# Patient Record
Sex: Male | Born: 1935
Health system: Southern US, Community
[De-identification: ages and names within clinical notes are randomized; demographics above are authoritative.]

## PROBLEM LIST (undated history)

## (undated) DIAGNOSIS — E782 Mixed hyperlipidemia: Secondary | ICD-10-CM

## (undated) DIAGNOSIS — K52831 Collagenous colitis: Secondary | ICD-10-CM

## (undated) DIAGNOSIS — G4733 Obstructive sleep apnea (adult) (pediatric): Secondary | ICD-10-CM

## (undated) DIAGNOSIS — I10 Essential (primary) hypertension: Secondary | ICD-10-CM

## (undated) DIAGNOSIS — M069 Rheumatoid arthritis, unspecified: Secondary | ICD-10-CM

## (undated) DIAGNOSIS — K449 Diaphragmatic hernia without obstruction or gangrene: Secondary | ICD-10-CM

## (undated) DIAGNOSIS — I2510 Atherosclerotic heart disease of native coronary artery without angina pectoris: Secondary | ICD-10-CM

## (undated) DIAGNOSIS — K219 Gastro-esophageal reflux disease without esophagitis: Secondary | ICD-10-CM

## (undated) DIAGNOSIS — R918 Other nonspecific abnormal finding of lung field: Secondary | ICD-10-CM

## (undated) DIAGNOSIS — C61 Malignant neoplasm of prostate: Secondary | ICD-10-CM

## (undated) HISTORY — DX: Obstructive sleep apnea (adult) (pediatric): G47.33

## (undated) HISTORY — DX: Mixed hyperlipidemia: E78.2

## (undated) HISTORY — DX: Collagenous colitis: K52.831

## (undated) HISTORY — DX: Rheumatoid arthritis, unspecified: M06.9

## (undated) HISTORY — PX: HEMORRHOID SURGERY: SHX153

## (undated) HISTORY — PX: PROSTATECTOMY: SHX69

## (undated) HISTORY — DX: Diaphragmatic hernia without obstruction or gangrene: K44.9

## (undated) HISTORY — DX: Gastro-esophageal reflux disease without esophagitis: K21.9

## (undated) HISTORY — PX: LAPAROSCOPIC CHOLECYSTECTOMY: SUR755

## (undated) HISTORY — DX: Atherosclerotic heart disease of native coronary artery without angina pectoris: I25.10

## (undated) HISTORY — DX: Malignant neoplasm of prostate: C61

## (undated) HISTORY — PX: OTHER SURGICAL HISTORY: SHX169

## (undated) HISTORY — DX: Other nonspecific abnormal finding of lung field: R91.8

## (undated) HISTORY — PX: APPENDECTOMY: SHX54

## (undated) HISTORY — DX: Essential (primary) hypertension: I10

---

## 2009-12-07 ENCOUNTER — Encounter: Payer: Self-pay | Admitting: Cardiology

## 2009-12-13 ENCOUNTER — Ambulatory Visit (HOSPITAL_COMMUNITY): Admission: RE | Admit: 2009-12-13 | Discharge: 2009-12-13 | Payer: Self-pay | Admitting: Ophthalmology

## 2009-12-27 ENCOUNTER — Ambulatory Visit (HOSPITAL_COMMUNITY): Admission: RE | Admit: 2009-12-27 | Discharge: 2009-12-27 | Payer: Self-pay | Admitting: Ophthalmology

## 2009-12-29 HISTORY — PX: CORONARY ANGIOPLASTY WITH STENT PLACEMENT: SHX49

## 2010-05-06 ENCOUNTER — Encounter: Payer: Self-pay | Admitting: Physician Assistant

## 2010-05-06 ENCOUNTER — Encounter: Payer: Self-pay | Admitting: Cardiology

## 2010-05-07 ENCOUNTER — Ambulatory Visit: Payer: Self-pay | Admitting: Cardiovascular Disease

## 2010-05-07 ENCOUNTER — Inpatient Hospital Stay (HOSPITAL_COMMUNITY): Admission: EM | Admit: 2010-05-07 | Discharge: 2010-05-09 | Payer: Self-pay | Admitting: Cardiology

## 2010-05-07 ENCOUNTER — Encounter: Payer: Self-pay | Admitting: Cardiology

## 2010-05-08 ENCOUNTER — Ambulatory Visit: Payer: Self-pay | Admitting: Cardiology

## 2010-05-09 ENCOUNTER — Encounter: Payer: Self-pay | Admitting: Cardiology

## 2010-05-10 ENCOUNTER — Telehealth (INDEPENDENT_AMBULATORY_CARE_PROVIDER_SITE_OTHER): Payer: Self-pay | Admitting: *Deleted

## 2010-05-24 ENCOUNTER — Ambulatory Visit: Payer: Self-pay | Admitting: Cardiology

## 2010-05-24 DIAGNOSIS — I2510 Atherosclerotic heart disease of native coronary artery without angina pectoris: Secondary | ICD-10-CM

## 2010-05-24 DIAGNOSIS — E782 Mixed hyperlipidemia: Secondary | ICD-10-CM

## 2010-05-24 DIAGNOSIS — I10 Essential (primary) hypertension: Secondary | ICD-10-CM | POA: Insufficient documentation

## 2010-06-03 ENCOUNTER — Telehealth (INDEPENDENT_AMBULATORY_CARE_PROVIDER_SITE_OTHER): Payer: Self-pay | Admitting: *Deleted

## 2010-06-05 ENCOUNTER — Encounter: Payer: Self-pay | Admitting: Cardiology

## 2010-06-06 ENCOUNTER — Telehealth (INDEPENDENT_AMBULATORY_CARE_PROVIDER_SITE_OTHER): Payer: Self-pay | Admitting: *Deleted

## 2010-07-24 ENCOUNTER — Ambulatory Visit: Payer: Self-pay | Admitting: Cardiology

## 2010-10-17 ENCOUNTER — Telehealth (INDEPENDENT_AMBULATORY_CARE_PROVIDER_SITE_OTHER): Payer: Self-pay | Admitting: *Deleted

## 2010-10-18 ENCOUNTER — Encounter: Payer: Self-pay | Admitting: Cardiology

## 2010-10-22 ENCOUNTER — Ambulatory Visit: Payer: Self-pay | Admitting: Cardiology

## 2010-12-09 ENCOUNTER — Telehealth: Payer: Self-pay | Admitting: Cardiology

## 2010-12-11 ENCOUNTER — Encounter: Payer: Self-pay | Admitting: Cardiology

## 2011-01-28 NOTE — Progress Notes (Signed)
Summary: Needs labs before 10/25 office visit  Phone Note Call from Patient Call back at Home Phone [phone]   Summary of Call: Pt called to see if he needs labs before office visit on 10/25. Pt is due for FLP/LFT and will do this before appt next week.  Initial call taken by: Cyril Loosen, RN, BSN,  October 17, 2010 1:28 PM

## 2011-01-28 NOTE — Progress Notes (Signed)
Summary:  CRESTOR   Phone Note Call from Patient Call back at Home Phone [phone]   Caller: wife-diane Details for Reason: Crestor Summary of Call: was d/c [facility] with Crestor-Went to [facility] and was told that his insurance Biomedical [employer]) will need to be appoved. Patient does not have any Crestor. States that [facility] pharmacy was suppose to contact our office today. home [phone] cell # [phone] Initial call taken by: Zachary George,  May 10, 2010 11:47 AM  Follow-up for Phone Call        Pt was started on Crestor 40mg  by mouth at bedtime at discharge from [facility]. He went to have this prescription filled but was told by pharmacy [facility]) that his insurance will not pay for this medication. Pt's wife states [facility] was going to send authorization to our office. We received refill authorization but no prior auth form.   Pt's wife states he has never taken any other cholesterol medication. He has a f/u appt with Dr. Diona Browner on May 27th.  Follow-up by: Cyril Loosen, RN, BSN,  May 10, 2010 2:39 PM  Additional Follow-up for Phone Call Additional follow up Details #1::        Would use Simvastatin 20 mg by mouth at bedtime instead of Crestor.  His LDL was only 85.  Get LFT/FLP in 12 weeks. Additional Follow-up by: Loreli Slot, MD, [facility],  May 13, 2010 3:12 PM    Additional Follow-up for Phone Call Additional follow up Details #2::    Left message to call back on machine. Cyril Loosen, RN, BSN  May 13, 2010 5:02 PM  Pt notified and verbalized understanding. Follow-up by: Cyril Loosen, RN, BSN,  May 14, 2010 8:41 AM  New/Updated Medications: SIMVASTATIN 20 MG TABS (SIMVASTATIN) Take one tablet by mouth daily at bedtime Prescriptions: SIMVASTATIN 20 MG TABS (SIMVASTATIN) Take one tablet by mouth daily at bedtime  #90 x 3   Entered by:   Cyril Loosen, RN, BSN   Authorized by:   Loreli Slot, MD, [facility]   Signed by:   Cyril Loosen, RN, BSN on  05/14/2010   Method used:   Faxed to ...       [facility] education [facility])       [address] [address]       [place], [place]  [address]       Ph: [phone]       Fax: [phone]   RxID:   [id number] SIMVASTATIN 20 MG TABS (SIMVASTATIN) Take one tablet by mouth daily at bedtime  #30 x 1   Entered by:   Cyril Loosen, RN, BSN   Authorized by:   Loreli Slot, MD, [facility]   Signed by:   Cyril Loosen, RN, BSN on 05/14/2010   Method used:   Electronically to        [facility] (retail)       [address]       [place], [place]  [address]       Ph: [phone]       Fax: [phone]   RxID:   [id number]   Appended Document:  CRESTOR  Pt called back today and left message on voicemail stating dose of Crestor was 40mg  and dose of simvastatin was 20mg . He wanted to confirm that this was correct. Left message to call back on wife's cell phone per pt's request.  Appended Document:  CRESTOR  Pt's wife notified and  verbalized understanding.

## 2011-01-28 NOTE — Assessment & Plan Note (Signed)
Summary: 2 MO F/U, FLP/LFT BEFORE APPT-JM   Visit Type:  Follow-up Primary Provider:  Dr. Fara Chute   History of Present Illness: 75 year old male presents for followup. He reports doing fairly well, with good energy, no angina, and no unusual breathlessness. He is remodeling a bathroom at his home and doing all the work himself.  Patient reported having diffuse muscle pain on simvastatin and the medication was discontinued. Followup labs from June showed AST 24, ALT 29, CPK 59. He was subsequently initiated on low-dose Lipitor and is tolerating this well so far. He has only been on the medication for one month.  He does report fairly easy bruising on dual antiplatelet therapy.  Preventive Screening-Counseling & Management  Alcohol-Tobacco     Smoking Status: quit     Year Quit: 1993  Current Medications (verified): 1)  Acetaminophen 325 Mg Tabs (Acetaminophen) .... Take 1-2 By Mouth Every 4 Hours As Needed 2)  Aspir-Trin 325 Mg Tbec (Aspirin) .... Take 1 Tablet By Mouth Once A Day 3)  Colcrys 0.6 Mg Tabs (Colchicine) .... Take 1 Tablet By Mouth Two Times A Day 4)  Hydroxyzine Hcl 10 Mg Tabs (Hydroxyzine Hcl) .... Take 2 Tablet By Mouth Once A Day At Bedtime 5)  Metoprolol Tartrate 25 Mg Tabs (Metoprolol Tartrate) .... Take 1/2 Tablet By Mouth Two Times A Day 6)  Nitrostat 0.4 Mg Subl (Nitroglycerin) .... Use As Directed 7)  Protonix 40 Mg Tbec (Pantoprazole Sodium) .... Take 2 Tablet By Mouth Once A Day 8)  Pilocarpine Hcl 5 Mg Tabs (Pilocarpine Hcl) .... Take 1 Tablet By Mouth Three Times A Day 9)  Effient 10 Mg Tabs (Prasugrel Hcl) .... Take 1 Tablet By Mouth Once A Day 10)  Lisinopril-Hydrochlorothiazide 20-12.5 Mg Tabs (Lisinopril-Hydrochlorothiazide) .... Take 1 Tablet By Mouth Once A Day 11)  Fish Oil 1000 Mg Caps (Omega-3 Fatty Acids) .... 3 Tabs Daily 12)  Multivitamins  Caps (Multiple Vitamin) .... Take 1 Tablet By Mouth Once A Day 13)  Zinc 50 Mg Tabs (Zinc) .... 2 Tabs  Daily 14)  C-Pap .... Uses Nightly 15)  Lipitor 10 Mg Tabs (Atorvastatin Calcium) .... Take One Tablet By Mouth At Bedtime.  Allergies (verified): 1)  ! Pcn 2)  ! Levaquin  Comments:  Nurse/Medical Assistant: The patient's medication list and allergies were reviewed with the patient and were updated in the Medication and Allergy Lists.  Past History:  Past Medical History: Last updated: 05/20/2010 G E R D Hyperlipidemia Hypertension Prostate cancer OSA Pulmonary nodules Hiatal hernia CAD - DES RCA 5/11, LVEF 55%  Social History: Last updated: 05/20/2010 Married  Tobacco Use - Former Alcohol Use - yes  Review of Systems  The patient denies anorexia, fever, chest pain, syncope, dyspnea on exertion, peripheral edema, melena, and hematochezia.         Otherwise reviewed and negative except as outlined.  Vital Signs:  Patient profile:   75 year old male Height:      69 inches Weight:      180 pounds Pulse rate:   62 / minute BP sitting:   128 / 78  (left arm) Cuff size:   regular  Vitals Entered By: Carlye Grippe (July 24, 2010 9:09 AM)  Physical Exam  Additional Exam:  Overweight male no acute distress. HEENT: Conjunctiva and lids normal, oropharynx clear. Neck: Supple, elevated JVP or bruits. Lungs: Clear to auscultation, nonlabored. Cardiac: Regular rate and rhythm, no S3. Abdomen: Soft, nontender, bowel sounds present. Skin: Warm  and dry. Musculoskeletal: No kyphosis. Extremities: No pitting edema, distal pulses full, stable left radial and right femoral catheter sites. Scattered ecchymoses on the forearms. Neuropsychiatric: Alert and oriented x3, affect appropriate.    Impression & Recommendations:  Problem # 1:  CORONARY ATHEROSCLEROSIS NATIVE CORONARY ARTERY (ICD-414.01)  Symptomatically stable on present medical regimen. Will reduce aspirin to 162 mg daily, continue Effient and other cardiac medications. Followup visit in 3 months.  His  updated medication list for this problem includes:    Aspir-trin 325 Mg Tbec (Aspirin) .Marland Kitchen... Take 1/2  tablet by mouth once a day    Metoprolol Tartrate 25 Mg Tabs (Metoprolol tartrate) .Marland Kitchen... Take 1/2 tablet by mouth two times a day    Nitrostat 0.4 Mg Subl (Nitroglycerin) ..... Use as directed    Effient 10 Mg Tabs (Prasugrel hcl) .Marland Kitchen... Take 1 tablet by mouth once a day    Lisinopril-hydrochlorothiazide 20-12.5 Mg Tabs (Lisinopril-hydrochlorothiazide) .Marland Kitchen... Take 1 tablet by mouth once a day  Problem # 2:  MIXED HYPERLIPIDEMIA (ICD-272.2)  His updated medication list for this problem includes:    Lipitor 10 Mg Tabs (Atorvastatin calcium) .Marland Kitchen... Take one tablet by mouth at bedtime.  Problem # 3:  ESSENTIAL HYPERTENSION, BENIGN (ICD-401.1)  Blood pressure well controlled today.  His updated medication list for this problem includes:    Aspir-trin 325 Mg Tbec (Aspirin) .Marland Kitchen... Take 1 tablet by mouth once a day    Metoprolol Tartrate 25 Mg Tabs (Metoprolol tartrate) .Marland Kitchen... Take 1/2 tablet by mouth two times a day    Lisinopril-hydrochlorothiazide 20-12.5 Mg Tabs (Lisinopril-hydrochlorothiazide) .Marland Kitchen... Take 1 tablet by mouth once a day  Patient Instructions: 1)  Decrease Aspirin to 162mg  by mouth once daily. This is 1/2 of your 325mg  tablets (or 2 of the 81 mg tablets). 2)  Your physician recommends that you go to the [facility] for a FASTING lipid profile and liver function labs:  DUE IN SEPT. 3)  Your physician wants you to follow-up in: 3 months. You will receive a reminder letter in the mail one-two months in advance. If you don't receive a letter, please call our office to schedule the follow-up appointment.

## 2011-01-28 NOTE — Assessment & Plan Note (Signed)
Summary: 3 MO FU   Visit Type:  Follow-up Primary Provider:  Dr. Fara Chute   History of Present Illness: 75 year old male presents for follow-up. He was seen back in July. He continues to do well without any significant angina or unusual shortness of breath. States he's been walking regularly. Reports compliance with medications.  Labs from 21 October showed AST Mathis, Kevin Mathis, cholesterol 112, triglycerides 88, HDL 39, LDL 55. We reviewed these today.  He still has some bruising on dual anti-platelet therapy. No major bleeding problems.  Clinical Review Panels:  Cardiac Imaging Cardiac Cath Findings  HEMODYNAMIC FINDINGS:  Central aortic pressure 153/83.  Left ventricular   pressure 149/7.  Left ventricular end-diastolic pressure 22.      ANGIOGRAPHIC FINDINGS:   1. Left main coronary artery had no evidence of disease.   2. Left anterior descending was a large vessel that coursed to the       apex and gave off a large diagonal branch.  There appeared to be a       heavy calcification in the proximal LAD with discrete 20% stenosis.       The mid and distal LAD had minor plaque disease.  The diagonal       branch was a large-caliber vessel with no significant disease.   3. The circumflex artery was a moderate-sized vessel that gave off a       large bifurcating obtuse marginal branch.  The proximal circumflex       had minor plaque disease.  The obtuse marginal branch had minor       plaque disease.  The AV groove circumflex had minor plaque disease.   4. The right coronary artery was a large dominant vessel that had       severe calcification throughout the proximal and midportions of the       vessel.  There were several discrete 40% stenoses in the proximal       vessel.  There was a severe 95% stenosis in the midportion of the       vessel.  There were serial 30% lesions in the distal portion of the       vessel.   5. Left ventricular angiogram was performed in the RAO  projection and       showed normal left ventricular systolic function with ejection       fraction of 55-60%. (05/08/2010)    Preventive Screening-Counseling & Management  Alcohol-Tobacco     Smoking Status: quit     Year Quit: 1993  Comments: routine 3 month f/u  Current Medications (verified): 1)  Acetaminophen 325 Mg Tabs (Acetaminophen) .... Take 1-2 By Mouth Every 4 Hours As Needed 2)  Aspir-Trin 325 Mg Tbec (Aspirin) .... Take 1/2  Tablet By Mouth Once A Day 3)  Colcrys 0.6 Mg Tabs (Colchicine) .... Take 1 Tablet By Mouth Two Times A Day 4)  Hydroxyzine Hcl 10 Mg Tabs (Hydroxyzine Hcl) .... Take 2 Tablet By Mouth Once A Day At Bedtime 5)  Metoprolol Tartrate Mathis Mg Tabs (Metoprolol Tartrate) .... Take 1/2 Tablet By Mouth Two Times A Day 6)  Nitrostat 0.4 Mg Subl (Nitroglycerin) .... Use As Directed 7)  Protonix 40 Mg Tbec (Pantoprazole Sodium) .... Take 2 Tablet By Mouth Once A Day 8)  Pilocarpine Hcl 5 Mg Tabs (Pilocarpine Hcl) .... Take 1 Tablet By Mouth Three Times A Day 9)  Effient 10 Mg Tabs (Prasugrel Hcl) .... Take 1  Tablet By Mouth Once A Day 10)  Lisinopril-Hydrochlorothiazide 20-12.5 Mg Tabs (Lisinopril-Hydrochlorothiazide) .... Take 1 Tablet By Mouth Once A Day 11)  Fish Oil 1000 Mg Caps (Omega-3 Fatty Acids) .... 3 Tabs Daily 12)  Multivitamins  Caps (Multiple Vitamin) .... Take 1 Tablet By Mouth Once A Day 13)  Zinc 50 Mg Tabs (Zinc) .... 2 Tabs Daily 14)  C-Pap .... Uses Nightly 15)  Lipitor 10 Mg Tabs (Atorvastatin Calcium) .... Take One Tablet By Mouth At Bedtime.  Allergies (verified): 1)  ! Pcn 2)  ! Levaquin  Past History:  Past Medical History: Last updated: 05/20/2010 G E R D Hyperlipidemia Hypertension Prostate cancer OSA Pulmonary nodules Hiatal hernia CAD - DES RCA 5/11, LVEF 55%  Social History: Last updated: 05/20/2010 Married  Tobacco Use - Former Alcohol Use - yes  Review of Systems  The patient denies anorexia, fever, weight  loss, chest pain, syncope, dyspnea on exertion, peripheral edema, hemoptysis, melena, hematochezia, and severe indigestion/heartburn.         Otherwise reviewed and negative except as outlined.  Vital Signs:  Patient profile:   75 year old male Height:      69 inches Weight:      177 pounds Pulse rate:   60 / minute BP sitting:   112 / 75  (right arm) Cuff size:   regular  Vitals Entered By: Hoover Brunette, LPN (October Mathis, 2011 12:58 PM) Is Patient Diabetic? No   Physical Exam  Additional Exam:  Overweight male no acute distress. HEENT: Conjunctiva and lids normal, oropharynx clear. Neck: Supple, elevated JVP or bruits. Lungs: Clear to auscultation, nonlabored. Cardiac: Regular rate and rhythm, no S3. Abdomen: Soft, nontender, bowel sounds present. Skin: Warm and dry. Scattered ecchymoses on the forearms. Musculoskeletal: No kyphosis. Extremities: No pitting edema, distal pulses full, stable left radial and right femoral catheter sites.  Neuropsychiatric: Alert and oriented x3, affect appropriate.    Impression & Recommendations:  Problem # 1:  CORONARY ATHEROSCLEROSIS NATIVE CORONARY ARTERY (ICD-414.01)  Symptomatically stable on medical therapy. Encouraged continued exercise regimen. Cut aspirin back to 81 mg daily, otherwise no medication changes. Followup scheduled for 6 months.  His updated medication list for this problem includes:    Aspir-trin 325 Mg Tbec (Aspirin) .Marland Kitchen... Take 1/2  tablet by mouth once a day    Metoprolol Tartrate Mathis Mg Tabs (Metoprolol tartrate) .Marland Kitchen... Take 1/2 tablet by mouth two times a day    Nitrostat 0.4 Mg Subl (Nitroglycerin) ..... Use as directed    Effient 10 Mg Tabs (Prasugrel hcl) .Marland Kitchen... Take 1 tablet by mouth once a day    Lisinopril-hydrochlorothiazide 20-12.5 Mg Tabs (Lisinopril-hydrochlorothiazide) .Marland Kitchen... Take 1 tablet by mouth once a day  Problem # 2:  ESSENTIAL HYPERTENSION, BENIGN (ICD-401.1)  Blood pressure well controlled  today.  His updated medication list for this problem includes:    Aspir-trin 325 Mg Tbec (Aspirin) .Marland Kitchen... Take 1/2  tablet by mouth once a day    Metoprolol Tartrate Mathis Mg Tabs (Metoprolol tartrate) .Marland Kitchen... Take 1/2 tablet by mouth two times a day    Lisinopril-hydrochlorothiazide 20-12.5 Mg Tabs (Lisinopril-hydrochlorothiazide) .Marland Kitchen... Take 1 tablet by mouth once a day  Problem # 3:  MIXED HYPERLIPIDEMIA (ICD-272.2)  LDL at goal. Continue present regimen. Followup fasting lipid profile and liver function tests for his next visit.  His updated medication list for this problem includes:    Lipitor 10 Mg Tabs (Atorvastatin calcium) .Marland Kitchen... Take one tablet by mouth at bedtime.  Patient Instructions: 1)  Your physician wants you to follow-up in: 6 months. You will receive a reminder letter in the mail one-two months in advance. If you don't receive a letter, please call our office to schedule the follow-up appointment. 2)  Your physician recommends that you go to the [facility] for a FASTING lipid profile and liver function labs:  IN 6 MONTHS BEFORE YOUR OFFICE VISIT. 3)  Decrease Aspirin to 81mg  by mouth once daily.

## 2011-01-28 NOTE — Consult Note (Signed)
Summary: CARDIOLOGY CONSULT/ [facility]  CARDIOLOGY CONSULT/ [facility]   Imported By: Zachary George 05/10/2010 12:04:51  _____________________________________________________________________  External Attachment:    Type:   Image     Comment:   External Document

## 2011-01-28 NOTE — Assessment & Plan Note (Signed)
Summary: eph-per m.spencer 2 wk fu   Visit Type:  Follow-up Primary Provider:  Dr. Fara Chute   History of Present Illness: 75 year old male presents for post-hospital follow-up. He was seen in consultation at [facility] recently with unstable angina and transferred to [facility] for cardiac catherization.  As noted below he underwent atherectomy with DES to RCA per Dr. Clifton James.  He tolerated this well, and symptomatically has improved following intervention. He and his wife recently traveled to [facility] for a vacation. He reports compliance with his medications. We did switch him from Crestor to simvastatin and he will be due for followup lipid profile and liver function tests over the next few months.  He reports occasional bruising, although no major bleeding problems on dual antiplatelet therapy. He is otherwise tolerating his medications.  Today we talked about his cardiac catheterization findings, the pathophysiology of CAD, and also a regular exercise regimen.  He is no longer taking niacin, and was also taken off of vitamin E per Dr. Reuel Boom.  Preventive Screening-Counseling & Management  Alcohol-Tobacco     Smoking Status: quit     Year Started: 35 yrs     Year Quit: 1993  Current Medications (verified): 1)  Simvastatin 20 Mg Tabs (Simvastatin) .... Take One Tablet By Mouth Daily At Bedtime 2)  Acetaminophen 325 Mg Tabs (Acetaminophen) .... Take 1-2 By Mouth Every 4 Hours As Needed 3)  Aspir-Trin 325 Mg Tbec (Aspirin) .... Take 1 Tablet By Mouth Once A Day 4)  Colcrys 0.6 Mg Tabs (Colchicine) .... Take 1 Tablet By Mouth Two Times A Day 5)  Hydroxyzine Hcl 10 Mg Tabs (Hydroxyzine Hcl) .... Take 2 Tablet By Mouth Once A Day At Bedtime 6)  Metoprolol Tartrate 25 Mg Tabs (Metoprolol Tartrate) .... Take 1/2 Tablet By Mouth Two Times A Day 7)  Nitrostat 0.4 Mg Subl (Nitroglycerin) .... Use As Directed 8)  Protonix 40 Mg Tbec (Pantoprazole Sodium) .... Take 2 Tablet By Mouth  Once A Day 9)  Pilocarpine Hcl 5 Mg Tabs (Pilocarpine Hcl) .... Take 1 Tablet By Mouth Three Times A Day 10)  Effient 10 Mg Tabs (Prasugrel Hcl) .... Take 1 Tablet By Mouth Once A Day 11)  Lisinopril-Hydrochlorothiazide 20-12.5 Mg Tabs (Lisinopril-Hydrochlorothiazide) .... Take 1 Tablet By Mouth Once A Day 12)  Fish Oil 1000 Mg Caps (Omega-3 Fatty Acids) .... 3 Tabs Daily 13)  Multivitamins  Caps (Multiple Vitamin) .... Take 1 Tablet By Mouth Once A Day 14)  Zinc 50 Mg Tabs (Zinc) .... 2 Tabs Daily 15)  C-Pap .... Uses Nightly  Allergies: 1)  ! Pcn 2)  ! Levaquin  Past History:  Past Medical History: Last updated: 05/20/2010 G E R D Hyperlipidemia Hypertension Prostate cancer OSA Pulmonary nodules Hiatal hernia CAD - DES RCA 5/11, LVEF 55%  Social History: Last updated: 05/20/2010 Married  Tobacco Use - Former Alcohol Use - yes  Review of Systems  The patient denies anorexia, fever, weight gain, chest pain, syncope, dyspnea on exertion, peripheral edema, prolonged cough, headaches, melena, hematochezia, and severe indigestion/heartburn.         Otherwise reviewed and negative.  Vital Signs:  Patient profile:   75 year old male Height:      69 inches Weight:      189.25 pounds BMI:     28.05 Pulse rate:   71 / minute BP sitting:   120 / 80  (left arm) Cuff size:   regular  Vitals Entered By: Inocencio Homes  [place], LPN (May 24, 2010 2:22 PM) Is Patient Diabetic? No   Physical Exam  Additional Exam:  Overweight male no acute distress. HEENT: Conjunctiva and lids normal, oropharynx clear. Neck: Supple, elevated JVP or bruits. Lungs: Clear to auscultation, nonlabored. Cardiac: Regular rate and rhythm, no S3. Abdomen: Soft, nontender, bowel sounds present. Skin: Warm and dry. Musculoskeletal: No kyphosis. Extremities: No pitting edema, distal pulses full, stable left radial and right femoral catheter sites. Neuropsychiatric: Alert and oriented x3, affect  appropriate.    Clinical Review Panels:  Cardiac Imaging Cardiac Cath Findings  HEMODYNAMIC FINDINGS:  Central aortic pressure 153/83.  Left ventricular   pressure 149/7.  Left ventricular end-diastolic pressure 22.      ANGIOGRAPHIC FINDINGS:   1. Left main coronary artery had no evidence of disease.   2. Left anterior descending was a large vessel that coursed to the       apex and gave off a large diagonal branch.  There appeared to be a       heavy calcification in the proximal LAD with discrete 20% stenosis.       The mid and distal LAD had minor plaque disease.  The diagonal       branch was a large-caliber vessel with no significant disease.   3. The circumflex artery was a moderate-sized vessel that gave off a       large bifurcating obtuse marginal branch.  The proximal circumflex       had minor plaque disease.  The obtuse marginal branch had minor       plaque disease.  The AV groove circumflex had minor plaque disease.   4. The right coronary artery was a large dominant vessel that had       severe calcification throughout the proximal and midportions of the       vessel.  There were several discrete 40% stenoses in the proximal       vessel.  There was a severe 95% stenosis in the midportion of the       vessel.  There were serial 30% lesions in the distal portion of the       vessel.   5. Left ventricular angiogram was performed in the RAO projection and       showed normal left ventricular systolic function with ejection       fraction of 55-60%. (05/08/2010)    Impression & Recommendations:  Problem # 1:  CORONARY ATHEROSCLEROSIS NATIVE CORONARY ARTERY (ICD-414.01)  Symptomatically stable following revascularization of the RCA as noted. He is tolerating medical therapy. We talked about advancing his exercise gradually. He will remain on dual antiplatelet therapy, for at least one year. I plan to see him back in the next 2 months.  The following medications were  removed from the medication list:    Lisinopril 20 Mg Tabs (Lisinopril) .Marland Kitchen... Take 1 tablet by mouth once a day His updated medication list for this problem includes:    Aspir-trin 325 Mg Tbec (Aspirin) .Marland Kitchen... Take 1 tablet by mouth once a day    Metoprolol Tartrate 25 Mg Tabs (Metoprolol tartrate) .Marland Kitchen... Take 1/2 tablet by mouth two times a day    Nitrostat 0.4 Mg Subl (Nitroglycerin) ..... Use as directed    Effient 10 Mg Tabs (Prasugrel hcl) .Marland Kitchen... Take 1 tablet by mouth once a day    Lisinopril-hydrochlorothiazide 20-12.5 Mg Tabs (Lisinopril-hydrochlorothiazide) .Marland Kitchen... Take 1 tablet by mouth once a day  Problem # 2:  ESSENTIAL HYPERTENSION,  BENIGN (ICD-401.1)  Blood pressure well controlled.  The following medications were removed from the medication list:    Hydrochlorothiazide 12.5 Mg Caps (Hydrochlorothiazide) .Marland Kitchen... Take 1 tablet by mouth once a day    Lisinopril 20 Mg Tabs (Lisinopril) .Marland Kitchen... Take 1 tablet by mouth once a day His updated medication list for this problem includes:    Aspir-trin 325 Mg Tbec (Aspirin) .Marland Kitchen... Take 1 tablet by mouth once a day    Metoprolol Tartrate 25 Mg Tabs (Metoprolol tartrate) .Marland Kitchen... Take 1/2 tablet by mouth two times a day    Lisinopril-hydrochlorothiazide 20-12.5 Mg Tabs (Lisinopril-hydrochlorothiazide) .Marland Kitchen... Take 1 tablet by mouth once a day  Problem # 3:  MIXED HYPERLIPIDEMIA (ICD-272.2)  Have elected to treat with simvastatin 20 mg daily instead of Crestor 40 mg daily. A followup fasting lipid profile with liver function tests will be obtained just prior to his next visit.  The following medications were removed from the medication list:    Niacin 500 Mg Tabs (Niacin) .Marland Kitchen... Take 3 tablet by mouth once a day His updated medication list for this problem includes:    Simvastatin 20 Mg Tabs (Simvastatin) .Marland Kitchen... Take one tablet by mouth daily at bedtime  Future Orders: T-Lipid Profile ([id number]) ... 07/17/2010  Other Orders: Future  Orders: T-Hepatic Function [id number]) ... 07/17/2010  Patient Instructions: 1)  FOLLOW UP APPT WITH DR. Charod Slawinski ON WED, JULY 27TH AT 9:10AM. 2)  Your physician recommends that you go to the [facility] for a FASTING lipid profile and liver function labs: DO A FEW DAYS BEFORE APPT IN [place]. Do not eat or drink after midnight.  3)  Your physician recommends that you continue on your current medications as directed. Please refer to the Current Medication list given to you today.

## 2011-01-28 NOTE — Progress Notes (Signed)
Summary: Pain with simvastatin  Phone Note Call from Patient Call back at [facility] Phone [phone]   Summary of Call: Pt called stating he's hurting "anywhere that has movement." He c/o pain in fingers, feet, ribcage, back, etc. He states this has been going on since starting the simvastatin. He didn't call before b/c he thought he just needed to get use to it but he continues to have pain. He would like to know what he should do. Pt aware Dr. Diona Browner is off today and will return tomorrow. He is aware he will be notified of Dr. Ival Bible recommendations as soon as the nurse gets an answer from him. Pt verbalized understanding.    Initial call taken by: Cyril Loosen, RN, BSN,  June 03, 2010 8:08 AM  Follow-up for Phone Call        Would stop Simvastatin for now.  Check total CK, LFT's. Follow-up by: Loreli Slot, MD, [facility],  June 04, 2010 9:39 AM  Additional Follow-up for Phone Call Additional follow up Details #1::        Pt notified and verbalized understanding. Additional Follow-up by: Cyril Loosen, RN, BSN,  June 04, 2010 2:12 PM    New/Updated Medications: SIMVASTATIN 20 MG TABS (SIMVASTATIN) Take one tablet by mouth daily at bedtime-HOLD FOR NOW

## 2011-01-28 NOTE — Progress Notes (Signed)
Summary: wants lab results, better off cholesterol med  Phone Note Call from Patient Call back at [facility] Phone [phone]   Summary of Call: Pt called for lab results. Notified these have not been received from [facility] yet but will be printed from their system and scanned in for Dr. McDowell's review. Pt's wife is aware he will be notified of results when reviewed by Dr. Diona Browner. Pt's wife states he feels better than he's felt in a long time since stopping cholesterol med. Initial call taken by: Cyril Loosen, RN, BSN,  June 06, 2010 4:51 PM

## 2011-01-30 NOTE — Letter (Signed)
Summary: External Correspondence/ [facility]  External Correspondence/ [facility]   Imported By: Dorise Hiss 12/13/2010 16:37:15  _____________________________________________________________________  External Attachment:    Type:   Image     Comment:   External Document

## 2011-01-30 NOTE — Progress Notes (Addendum)
Summary: Wrist/Feet Aching ? secondary to Lipitor  Phone Note Call from Patient Call back at Home Phone [phone]   Summary of Call: Pt states he thought he was going to do okay with Lipitor but now he thinks he may need to try something else. He states his wrists/feet ache all the time and he can't think of anything else that could be causing this other than Lipitor. He states he thinks he tried Zocor in the past. He is willing to try something else.  Initial call taken by: Cyril Loosen, RN, BSN,  December 09, 2010 8:31 AM  Follow-up for Phone Call        Would try to take Lipitor 10 mg every other day. If this is not effective, we can consider changing to Pravachol. Follow-up by: Loreli Slot, MD, [facility],  December 09, 2010 9:22 AM     Appended Document: Wrist/Feet Aching ? secondary to Lipitor Pt notified and verbalized understanding.   Appended Document: Wrist/Feet Aching ? secondary to Lipitor Pt called the office this morning stating there was no difference in his pain from taking Lipitor once daily or every other day. He states he stopped the Lipitor 1 week ago and has felt much better since stopping it.  Appended Document: Wrist/Feet Aching ? secondary to Lipitor Reviewed. Agree with stopping Lipitor, particularly since associated with resolution of symptoms. Could consider the possibility of Pravachol 20 mg daily as the next option. If this is better tolerated, would check fasting lipid profile and liver function tests in approximately 12 weeks.  Appended Document: Wrist/Feet Aching ? secondary to Lipitor Left message to call back on voicemail.  Appended Document: Wrist/Feet Aching ? secondary to Lipitor Pt notified and is willing to try Pravachol. He will notify the office if he is able to tolerate so that a 90 prescription can be sent to his [employer].   Clinical Lists Changes  Medications: Removed medication of LIPITOR 10 MG TABS (ATORVASTATIN  CALCIUM) Take one tablet by mouth at bedtime. Added new medication of PRAVASTATIN SODIUM 20 MG TABS (PRAVASTATIN SODIUM) Take one tablet by mouth daily at bedtime - Signed Rx of PRAVASTATIN SODIUM 20 MG TABS (PRAVASTATIN SODIUM) Take one tablet by mouth daily at bedtime;  #30 x 2;  Signed;  Entered by: Cyril Loosen, RN, BSN;  Authorized by: Loreli Slot, MD, [facility];  Method used: Electronically to [facility] [address], [place], [place], [place]  [address], Ph: [phone], Fax: [phone]    Prescriptions: PRAVASTATIN SODIUM 20 MG TABS (PRAVASTATIN SODIUM) Take one tablet by mouth daily at bedtime  #30 x 2   Entered by:   Cyril Loosen, RN, BSN   Authorized by:   Loreli Slot, MD, [facility]   Signed by:   Cyril Loosen, RN, BSN on 01/10/2011   Method used:   Electronically to        [facility] (retail)       [address]       [place], [place]  [address]       Ph: [phone]       Fax: [phone]   RxID:   [id number]    Appended Document: Wrist/Feet Aching ? secondary to Lipitor Pt tolerating Pravachol well. He would like [facility] He is aware we will send this today. He is also reminded to do labs in about 45  more days and that an order will be faxed to him.   Clinical Lists Changes  Medications: Rx of PRAVASTATIN SODIUM 20 MG TABS (PRAVASTATIN SODIUM) Take one tablet by mouth daily at bedtime;  #90 x 3;  Signed;  Entered by: Cyril Loosen, RN, BSN;  Authorized by: Loreli Slot, MD, [facility];  Method used: Faxed to [facility], [address] [address], [place], [place]  [address], Ph: [phone], Fax: [phone] Orders: Added new Test order of T-Lipid Profile [id number]) - Signed Added new Test order of T-Hepatic Function [id number]) - Signed    Prescriptions: PRAVASTATIN SODIUM 20 MG TABS (PRAVASTATIN SODIUM) Take one tablet by mouth daily at bedtime  #90 x 3    Entered by:   Cyril Loosen, RN, BSN   Authorized by:   Loreli Slot, MD, [facility]   Signed by:   Cyril Loosen, RN, BSN on 02/28/2011   Method used:   Faxed to ...       [facility] [employer] education [employer])       [address] [address]       [place], [place]  [address]       Ph: [phone]       Fax: [phone]   RxID:   [id number]

## 2011-02-28 ENCOUNTER — Encounter (INDEPENDENT_AMBULATORY_CARE_PROVIDER_SITE_OTHER): Payer: Self-pay | Admitting: *Deleted

## 2011-03-06 NOTE — Letter (Signed)
Summary: Generic Engineer, agricultural at [facility] [address]   [place], [place] [address]   Phone: [phone]  Fax: [phone]        February 28, 2011 MRN: [id number]    FABIANO GINLEY [address] [place], [place]  [address]    Dear Mr. Capurro,   Enclosed you will find a copy of your lab order as we discussed.   Do not eat or drink after midnight.     Sincerely,  Cyril Loosen, RN, BSN  This letter has been electronically signed by your physician.

## 2011-03-18 LAB — CBC
HCT: 35.1 % — ABNORMAL LOW (ref 39.0–52.0)
HCT: 36.5 % — ABNORMAL LOW (ref 39.0–52.0)
Hemoglobin: 12.4 g/dL — ABNORMAL LOW (ref 13.0–17.0)
Hemoglobin: 12.4 g/dL — ABNORMAL LOW (ref 13.0–17.0)
MCHC: 34.1 g/dL (ref 30.0–36.0)
MCHC: 35.3 g/dL (ref 30.0–36.0)
MCV: 95.7 fL (ref 78.0–100.0)
MCV: 97.5 fL (ref 78.0–100.0)
Platelets: 171 K/uL (ref 150–400)
Platelets: 175 K/uL (ref 150–400)
RBC: 3.67 MIL/uL — ABNORMAL LOW (ref 4.22–5.81)
RBC: 3.74 MIL/uL — ABNORMAL LOW (ref 4.22–5.81)
RDW: 14.7 % (ref 11.5–15.5)
RDW: 14.7 % (ref 11.5–15.5)
WBC: 5.6 K/uL (ref 4.0–10.5)
WBC: 6.7 K/uL (ref 4.0–10.5)

## 2011-03-18 LAB — BASIC METABOLIC PANEL WITH GFR
BUN: 6 mg/dL (ref 6–23)
BUN: 7 mg/dL (ref 6–23)
CO2: 25 meq/L (ref 19–32)
CO2: 26 meq/L (ref 19–32)
Calcium: 8.4 mg/dL (ref 8.4–10.5)
Calcium: 8.6 mg/dL (ref 8.4–10.5)
Chloride: 105 meq/L (ref 96–112)
Chloride: 107 meq/L (ref 96–112)
Creatinine, Ser: 1.09 mg/dL (ref 0.4–1.5)
Creatinine, Ser: 1.19 mg/dL (ref 0.4–1.5)
GFR calc non Af Amer: >60 mL/min
GFR calc non Af Amer: >60 mL/min
Glucose, Bld: 121 mg/dL — ABNORMAL HIGH (ref 70–99)
Glucose, Bld: 123 mg/dL — ABNORMAL HIGH (ref 70–99)
Potassium: 4 meq/L (ref 3.5–5.1)
Potassium: 4 meq/L (ref 3.5–5.1)
Sodium: 137 meq/L (ref 135–145)
Sodium: 139 meq/L (ref 135–145)

## 2011-03-18 LAB — LIPID PANEL
HDL: 27 mg/dL — ABNORMAL LOW
Total CHOL/HDL Ratio: 6 ratio
Triglycerides: 248 mg/dL — ABNORMAL HIGH
VLDL: 50 mg/dL — ABNORMAL HIGH (ref 0–40)

## 2011-03-18 LAB — APTT
aPTT: 30 s (ref 24–37)
aPTT: 35 s (ref 24–37)

## 2011-03-18 LAB — PROTIME-INR
INR: 1 (ref 0.00–1.49)
Prothrombin Time: 13.1 s (ref 11.6–15.2)

## 2011-04-01 LAB — BASIC METABOLIC PANEL
BUN: 17 mg/dL (ref 6–23)
Creatinine, Ser: 1.04 mg/dL (ref 0.4–1.5)
GFR calc non Af Amer: >60 mL/min (ref >60)

## 2011-04-01 LAB — HEMOGLOBIN AND HEMATOCRIT, BLOOD: Hemoglobin: 13.7 g/dL (ref 13.0–17.0)

## 2011-04-29 ENCOUNTER — Encounter: Payer: Self-pay | Admitting: Cardiology

## 2011-04-30 ENCOUNTER — Encounter: Payer: Self-pay | Admitting: Cardiology

## 2011-04-30 ENCOUNTER — Ambulatory Visit (INDEPENDENT_AMBULATORY_CARE_PROVIDER_SITE_OTHER): Payer: Medicare Other | Admitting: Cardiology

## 2011-04-30 VITALS — BP 127/86 | HR 81 | Ht 69.0 in | Wt 185.8 lb

## 2011-04-30 DIAGNOSIS — I2510 Atherosclerotic heart disease of native coronary artery without angina pectoris: Secondary | ICD-10-CM

## 2011-04-30 DIAGNOSIS — I10 Essential (primary) hypertension: Secondary | ICD-10-CM

## 2011-04-30 DIAGNOSIS — E782 Mixed hyperlipidemia: Secondary | ICD-10-CM

## 2011-04-30 MED ORDER — PANTOPRAZOLE SODIUM 40 MG PO TBEC: 40.0000 mg | DELAYED_RELEASE_TABLET | Freq: Two times a day (BID) | ORAL | Status: DC

## 2011-04-30 MED ORDER — PRASUGREL HCL 10 MG PO TABS: 10.0000 mg | ORAL_TABLET | Freq: Every day | ORAL | Status: DC

## 2011-04-30 MED ORDER — METOPROLOL TARTRATE 25 MG PO TABS: ORAL_TABLET | ORAL | Status: DC

## 2011-04-30 MED ORDER — ROSUVASTATIN CALCIUM 5 MG PO TABS: 5.0000 mg | ORAL_TABLET | Freq: Every day | ORAL | Status: DC

## 2011-04-30 NOTE — Progress Notes (Signed)
Clinical Summary [name] is a 75 y.o.male presenting for followup. He was seen in October 2011. He reports no progressive angina or shortness of breath with typical activities. He has been working out doors recently.  Reports compliance with medications. He is having some mild arthralgias in his hands on Pravachol, although indicates that he generally tolerates it. He has had trouble with Lipitor and simvastatin in the past. Recent followup lab work with Kevin Mathis in early April showed total cholesterol 168, triglycerides 253, HDL 36, LDL 81. AST and ALT were normal.  Allergies  Allergen Reactions  . Levofloxacin     REACTION: rash  . Penicillins     REACTION: hives    Current outpatient prescriptions:aspirin 81 MG tablet, Take 81 mg by mouth daily.  , Disp: , Rfl: ;  colchicine 0.6 MG tablet, Take 0.6 mg by mouth 2 (two) times daily.  , Disp: , Rfl: ;  lisinopril-hydrochlorothiazide (PRINZIDE,ZESTORETIC) 20-12.5 MG per tablet, Take 1 tablet by mouth daily.  , Disp: , Rfl: ;  metoprolol tartrate (LOPRESSOR) 25 MG tablet, Take 1/2 tablet by mouth twice per day., Disp: 90 tablet, Rfl: 3 Multiple Vitamin (MULTIVITAMIN) tablet, Take 1 tablet by mouth daily.  , Disp: , Rfl: ;  Omega-3 Fatty Acids (FISH OIL) 1000 MG CPDR, Take by mouth. Take 3 tabs daily , Disp: , Rfl: ;  pantoprazole (PROTONIX) 40 MG tablet, Take 1 tablet (40 mg total) by mouth 2 (two) times daily., Disp: 180 tablet, Rfl: 3;  prasugrel (EFFIENT) 10 MG TABS, Take 1 tablet (10 mg total) by mouth daily., Disp: 90 tablet, Rfl: 3 zinc gluconate 50 MG tablet, Take 50 mg by mouth. Take 2 tabs daily , Disp: , Rfl: ;  DISCONTD: metoprolol tartrate (LOPRESSOR) 25 MG tablet, Take 25 mg by mouth. Take 1/2 tab (12.5mg ) twice a day   , Disp: , Rfl: ;  DISCONTD: pantoprazole (PROTONIX) 40 MG tablet, Take 40 mg by mouth 2 (two) times daily.  , Disp: , Rfl: ;  DISCONTD: prasugrel (EFFIENT) 10 MG TABS, Take by mouth daily.  , Disp: , Rfl:  DISCONTD:  pravastatin (PRAVACHOL) 20 MG tablet, Take 20 mg by mouth at bedtime.  , Disp: , Rfl: ;  nitroGLYCERIN (NITROSTAT) 0.4 MG SL tablet, Place 0.4 mg under the tongue every 5 (five) minutes as needed.  , Disp: , Rfl: ;  rosuvastatin (CRESTOR) 5 MG tablet, Take 1 tablet (5 mg total) by mouth at bedtime., Disp: 30 tablet, Rfl: 3  Past Medical History  Diagnosis Date  . Coronary atherosclerosis of native coronary artery     DES RCA 5/11, LVEF 55%  . Hiatal hernia   . Pulmonary nodules   . Obstructive sleep apnea   . Prostate cancer   . Essential hypertension, benign   . Mixed hyperlipidemia   . GERD (gastroesophageal reflux disease)     Social History Kevin Mathis reports that he quit smoking about 19 years ago. His smoking use included Cigarettes. He has never used smokeless tobacco. Kevin Mathis reports that he drinks alcohol.  Review of Systems No progressive angina or shortness of breath. No reported bleeding problems. Otherwise reviewed and negative.  Physical Examination Filed Vitals:   04/30/11 1605  BP: 127/86  Pulse: 81   Overweight male no acute distress. HEENT: Conjunctiva and lids normal, oropharynx clear. Neck: Supple, elevated JVP or bruits. Lungs: Clear to auscultation, nonlabored. Cardiac: Regular rate and rhythm, no S3. Abdomen: Soft, nontender, bowel sounds present. Skin: Warm  and dry. Scattered ecchymoses on the forearms. Musculoskeletal: No kyphosis. Extremities: No pitting edema, distal pulses full, stable left radial and right femoral catheter sites.  Neuropsychiatric: Alert and oriented x3, affect appropriate.   Studies Cardiac catheterization 05/08/2010: ANGIOGRAPHIC FINDINGS:   1. Left main coronary artery had no evidence of disease.   2. Left anterior descending was a large vessel that coursed to the       apex and gave off a large diagonal branch.  There appeared to be a       heavy calcification in the proximal LAD with discrete 20% stenosis.       The  mid and distal LAD had minor plaque disease.  The diagonal       branch was a large-caliber vessel with no significant disease.   3. The circumflex artery was a moderate-sized vessel that gave off a       large bifurcating obtuse marginal branch.  The proximal circumflex       had minor plaque disease.  The obtuse marginal branch had minor       plaque disease.  The AV groove circumflex had minor plaque disease.   4. The right coronary artery was a large dominant vessel that had       severe calcification throughout the proximal and midportions of the       vessel.  There were several discrete 40% stenoses in the proximal       vessel.  There was a severe 95% stenosis in the midportion of the       vessel.  There were serial 30% lesions in the distal portion of the       vessel.   5. Left ventricular angiogram was performed in the RAO projection and       showed normal left ventricular systolic function with ejection       fraction of 55-60%.  Problem List and Plan

## 2011-04-30 NOTE — Assessment & Plan Note (Signed)
Symptomatically stable on medical therapy. Continue observation. 

## 2011-04-30 NOTE — Patient Instructions (Signed)
Your physician wants you to follow-up in: 6 months. You will receive a reminder letter in the mail one-two months in advance. If you don't receive a letter, please call our office to schedule the follow-up appointment. Stop Pravachol Start Crestor 5mg  every evening.  Your physician recommends that you go to the [facility] for a FASTING lipid profile and liver function labs. Do not eat or drink after midnight. DO IN 3 MONTHS.

## 2011-04-30 NOTE — Assessment & Plan Note (Signed)
LDL recently 81, fairly reasonable on low-dose Pravachol. He does have some arthralgias, and wanted to try Crestor 5 mg daily to see if he tolerated better. If so we will repeat the fasting lipid profile and liver function tests in 3 months, otherwise change back to Pravachol.

## 2011-04-30 NOTE — Assessment & Plan Note (Signed)
Continue present regimen.

## 2011-05-01 ENCOUNTER — Telehealth: Payer: Self-pay | Admitting: *Deleted

## 2011-05-01 NOTE — Telephone Encounter (Signed)
Prior Auth required for Crestor. Pt ID # [id number]. Spoke w/insurance rep. Approval given for Crestor w/start date of today.  Pharmacy notified.

## 2011-05-02 ENCOUNTER — Encounter: Payer: Self-pay | Admitting: Cardiology

## 2011-05-23 ENCOUNTER — Telehealth: Payer: Self-pay | Admitting: *Deleted

## 2011-05-23 MED ORDER — PRAVASTATIN SODIUM 20 MG PO TABS: 20.0000 mg | ORAL_TABLET | Freq: Every evening | ORAL | Status: DC

## 2011-05-23 NOTE — Telephone Encounter (Signed)
At his recent office visit we discussed changing from Pravachol 20 mg daily to Crestor 5 mg daily, for more aggressive LDL control. If he is having trouble with Crestor, suggest going back to Pravachol.

## 2011-05-23 NOTE — Telephone Encounter (Signed)
Pt left message asking for a return call regarding    Spoke with pt who states yesterday his hands, wrists, legs and feet hurt terribly. He states today it hasn't been as bad, but it feels like it has with the other cholesterol medications. He states he will be going out of town this weekend.

## 2011-05-23 NOTE — Telephone Encounter (Signed)
Pt notified and verbalized understanding.

## 2011-06-13 ENCOUNTER — Telehealth: Payer: Self-pay | Admitting: *Deleted

## 2011-06-13 NOTE — Telephone Encounter (Signed)
Noted  

## 2011-06-13 NOTE — Telephone Encounter (Signed)
Low-dose Crestor still remains an option. Okay to try Lovaza if he would like to. I would be surprised if this is cheaper than OTC omega-3 supplements however.

## 2011-06-13 NOTE — Telephone Encounter (Signed)
Pt left message on voicemail regarding pravastatin. He states he went back on this as discussed previously. He sates he was hurting really bad all over. He states he stopped it on Monday and notices a "huge difference" today. He wants to know if there is something else he can try. He also would like to know if he can/should try Lovaza instead of OTC Fish Oil to save on cost and possibly be more effective.   Pt's wife aware a message will be sent to Dr. Diona Browner for further review.

## 2011-06-13 NOTE — Telephone Encounter (Signed)
Spoke w/pt's wife as he was not home. Pt will check into pricing of Lovaza and let us know if he wants to pursue this. Per pt's wife, he was not able to tolerate the Crestor when he took if following last office visit.

## 2011-06-16 ENCOUNTER — Other Ambulatory Visit: Payer: Self-pay | Admitting: Cardiovascular Disease

## 2011-06-16 NOTE — Telephone Encounter (Signed)
Pt notified and verbalized understanding. He states he will take what he can with the cholesterol medication based on how he feels. He will notify regarding Lovaza

## 2011-08-07 ENCOUNTER — Other Ambulatory Visit: Payer: Self-pay | Admitting: Otolaryngology

## 2011-08-07 DIAGNOSIS — H905 Unspecified sensorineural hearing loss: Secondary | ICD-10-CM

## 2011-08-07 DIAGNOSIS — H9319 Tinnitus, unspecified ear: Secondary | ICD-10-CM

## 2011-08-07 DIAGNOSIS — H9120 Sudden idiopathic hearing loss, unspecified ear: Secondary | ICD-10-CM

## 2011-08-18 ENCOUNTER — Ambulatory Visit
Admission: RE | Admit: 2011-08-18 | Discharge: 2011-08-18 | Disposition: A | Discharge status: Home / Self Care | Payer: Medicare Other | Source: Ambulatory Visit | Attending: Otolaryngology | Admitting: Otolaryngology

## 2011-08-18 DIAGNOSIS — H9319 Tinnitus, unspecified ear: Secondary | ICD-10-CM

## 2011-08-18 DIAGNOSIS — H9120 Sudden idiopathic hearing loss, unspecified ear: Secondary | ICD-10-CM

## 2011-08-18 DIAGNOSIS — H905 Unspecified sensorineural hearing loss: Secondary | ICD-10-CM

## 2011-08-18 MED ORDER — GADOBENATE DIMEGLUMINE 529 MG/ML IV SOLN
15.0000 mL | Freq: Once | INTRAVENOUS | Status: AC | PRN
  Administered 2011-08-18: 15 mL via INTRAVENOUS

## 2011-11-03 ENCOUNTER — Encounter: Payer: Self-pay | Admitting: Cardiology

## 2011-11-05 ENCOUNTER — Encounter: Payer: Self-pay | Admitting: Cardiology

## 2011-11-05 ENCOUNTER — Ambulatory Visit (INDEPENDENT_AMBULATORY_CARE_PROVIDER_SITE_OTHER): Payer: Medicare Other | Admitting: Cardiology

## 2011-11-05 VITALS — BP 122/80 | HR 74 | Ht 67.0 in | Wt 183.0 lb

## 2011-11-05 DIAGNOSIS — E782 Mixed hyperlipidemia: Secondary | ICD-10-CM

## 2011-11-05 DIAGNOSIS — I2510 Atherosclerotic heart disease of native coronary artery without angina pectoris: Secondary | ICD-10-CM

## 2011-11-05 DIAGNOSIS — I10 Essential (primary) hypertension: Secondary | ICD-10-CM

## 2011-11-05 MED ORDER — OMEGA-3-ACID ETHYL ESTERS 1 G PO CAPS: 2.0000 g | ORAL_CAPSULE | Freq: Two times a day (BID) | ORAL | Status: DC

## 2011-11-05 MED ORDER — NITROGLYCERIN 0.4 MG SL SUBL: 0.4000 mg | SUBLINGUAL_TABLET | SUBLINGUAL | Status: DC | PRN

## 2011-11-05 NOTE — Patient Instructions (Signed)
Your physician wants you to follow-up in: 6 months. You will receive a reminder letter in the mail one-two months in advance. If you don't receive a letter, please call our office to schedule the follow-up appointment. Lovaza 2 tablets two times a day. Your physician recommends that you go to the [facility] for a FASTING lipid profile and liver function labs. Do not eat or drink after midnight. BEFORE NEXT APPT IN 6 MONTHS.

## 2011-11-05 NOTE — Assessment & Plan Note (Signed)
Good blood pressure control today, on Norvasc. No other changes made.

## 2011-11-05 NOTE — Assessment & Plan Note (Signed)
Kevin Mathis asked about switching to Lovaza from OTC omega-3 supplements. Prescription provided.

## 2011-11-05 NOTE — Assessment & Plan Note (Signed)
Symptomatically stable on medical therapy. Followup ECG is normal. Continue observation.

## 2011-11-05 NOTE — Progress Notes (Signed)
Clinical Summary Kevin Mathis is a 75 y.o.male presenting for followup. He was seen in May.  Record review finds that he was admitted to [facility] back in October with febrile illness, acute renal insufficiency, treated with empiric antibiotics, also seen by Dr. Kristian Covey. He was managed by Dr. Neita Carp.  Recent followup lab work shows BUN 15, creatinine 1.1, sodium 140, potassium 4.2, hemoglobin 13.1. He was taken off of ACE inhibitor therapy, now on Norvasc. He has follow up with Dr. Kristian Covey today.  He denies any significant angina or progressive shortness of breath. No unusual bleeding problems on current therapy.  He was unable tolerate Crestor, has been taking omega-3 supplements, with followup lipids in August showing LDL under 100.   Allergies  Allergen Reactions  . Levofloxacin     REACTION: rash  . Penicillins     REACTION: hives  . Statins Other (See Comments)    aching    Medication list reviewed.  Past Medical History  Diagnosis Date  . Coronary atherosclerosis of native coronary artery     DES RCA 5/11, LVEF 55%  . Hiatal hernia   . Pulmonary nodules   . Obstructive sleep apnea   . Prostate cancer   . Essential hypertension, benign   . Mixed hyperlipidemia   . GERD (gastroesophageal reflux disease)     Past Surgical History  Procedure Date  . Appendectomy   . Hemorrhoid surgery   . Prostatectomy   . Laparoscopic cholecystectomy   . Skin cancer resection     Family History  Problem Relation Age of Onset  . Hypertension      Social History Kevin Mathis reports that he quit smoking about 19 years ago. His smoking use included Cigarettes. He has a 40 pack-year smoking history. He has never used smokeless tobacco. Kevin Mathis reports that he drinks alcohol.  Review of Systems No palpitations or syncope. Stable appetite. No fevers or chills. Otherwise negative.  Physical Examination Filed Vitals:   11/05/11 1116  BP: 122/80  Pulse: 74    Overweight male  no acute distress.  HEENT: Conjunctiva and lids normal, oropharynx clear.  Neck: Supple, elevated JVP or bruits.  Lungs: Clear to auscultation, nonlabored.  Cardiac: Regular rate and rhythm, no S3.  Abdomen: Soft, nontender, bowel sounds present.  Skin: Warm and dry. Scattered ecchymoses on the forearms.  Musculoskeletal: No kyphosis.  Extremities: No pitting edema, distal pulses full, stable left radial and right femoral catheter sites.  Neuropsychiatric: Alert and oriented x3, affect appropriate.   ECG Normal sinus rhythm.   Problem List and Plan

## 2011-11-28 DIAGNOSIS — L438 Other lichen planus: Secondary | ICD-10-CM | POA: Insufficient documentation

## 2011-11-28 DIAGNOSIS — Z872 Personal history of diseases of the skin and subcutaneous tissue: Secondary | ICD-10-CM | POA: Insufficient documentation

## 2011-12-26 ENCOUNTER — Other Ambulatory Visit: Payer: Self-pay | Admitting: *Deleted

## 2011-12-26 MED ORDER — AMLODIPINE BESYLATE 5 MG PO TABS: 5.0000 mg | ORAL_TABLET | Freq: Every day | ORAL | Status: DC

## 2011-12-31 DIAGNOSIS — H5460 Unqualified visual loss, one eye, unspecified: Secondary | ICD-10-CM | POA: Diagnosis not present

## 2011-12-31 DIAGNOSIS — I10 Essential (primary) hypertension: Secondary | ICD-10-CM | POA: Diagnosis not present

## 2011-12-31 DIAGNOSIS — E7800 Pure hypercholesterolemia, unspecified: Secondary | ICD-10-CM | POA: Diagnosis not present

## 2011-12-31 DIAGNOSIS — H35379 Puckering of macula, unspecified eye: Secondary | ICD-10-CM | POA: Diagnosis not present

## 2012-01-02 ENCOUNTER — Other Ambulatory Visit: Payer: Self-pay | Admitting: *Deleted

## 2012-01-02 MED ORDER — OMEGA-3-ACID ETHYL ESTERS 1 G PO CAPS: 2.0000 g | ORAL_CAPSULE | Freq: Two times a day (BID) | ORAL | Status: DC

## 2012-01-02 MED ORDER — AMLODIPINE BESYLATE 5 MG PO TABS: 5.0000 mg | ORAL_TABLET | Freq: Every day | ORAL | Status: DC

## 2012-01-05 DIAGNOSIS — G4733 Obstructive sleep apnea (adult) (pediatric): Secondary | ICD-10-CM | POA: Diagnosis not present

## 2012-01-05 DIAGNOSIS — I10 Essential (primary) hypertension: Secondary | ICD-10-CM | POA: Diagnosis not present

## 2012-01-05 DIAGNOSIS — E7800 Pure hypercholesterolemia, unspecified: Secondary | ICD-10-CM | POA: Diagnosis not present

## 2012-01-05 DIAGNOSIS — I259 Chronic ischemic heart disease, unspecified: Secondary | ICD-10-CM | POA: Diagnosis not present

## 2012-01-05 DIAGNOSIS — M1990 Unspecified osteoarthritis, unspecified site: Secondary | ICD-10-CM | POA: Diagnosis not present

## 2012-01-05 DIAGNOSIS — E781 Pure hyperglyceridemia: Secondary | ICD-10-CM | POA: Diagnosis not present

## 2012-01-08 DIAGNOSIS — E11349 Type 2 diabetes mellitus with severe nonproliferative diabetic retinopathy without macular edema: Secondary | ICD-10-CM | POA: Diagnosis not present

## 2012-01-29 DIAGNOSIS — H35359 Cystoid macular degeneration, unspecified eye: Secondary | ICD-10-CM | POA: Diagnosis not present

## 2012-01-29 DIAGNOSIS — Z961 Presence of intraocular lens: Secondary | ICD-10-CM | POA: Diagnosis not present

## 2012-01-29 DIAGNOSIS — H35379 Puckering of macula, unspecified eye: Secondary | ICD-10-CM | POA: Diagnosis not present

## 2012-02-02 ENCOUNTER — Other Ambulatory Visit: Payer: Self-pay | Admitting: *Deleted

## 2012-02-02 MED ORDER — PANTOPRAZOLE SODIUM 40 MG PO TBEC: 40.0000 mg | DELAYED_RELEASE_TABLET | Freq: Two times a day (BID) | ORAL | Status: DC

## 2012-03-11 DIAGNOSIS — H35359 Cystoid macular degeneration, unspecified eye: Secondary | ICD-10-CM | POA: Diagnosis not present

## 2012-03-16 ENCOUNTER — Other Ambulatory Visit: Payer: Self-pay | Admitting: *Deleted

## 2012-03-16 MED ORDER — PRASUGREL HCL 10 MG PO TABS: 10.0000 mg | ORAL_TABLET | Freq: Every day | ORAL | Status: DC

## 2012-03-16 MED ORDER — METOPROLOL TARTRATE 25 MG PO TABS: ORAL_TABLET | ORAL | Status: DC

## 2012-03-17 DIAGNOSIS — Z961 Presence of intraocular lens: Secondary | ICD-10-CM | POA: Diagnosis not present

## 2012-03-17 DIAGNOSIS — H5200 Hypermetropia, unspecified eye: Secondary | ICD-10-CM | POA: Diagnosis not present

## 2012-03-17 DIAGNOSIS — H52229 Regular astigmatism, unspecified eye: Secondary | ICD-10-CM | POA: Diagnosis not present

## 2012-03-17 DIAGNOSIS — H35379 Puckering of macula, unspecified eye: Secondary | ICD-10-CM | POA: Diagnosis not present

## 2012-03-22 ENCOUNTER — Telehealth: Payer: Self-pay | Admitting: *Deleted

## 2012-03-22 NOTE — Telephone Encounter (Signed)
Sent already on 03/16/12

## 2012-03-22 NOTE — Telephone Encounter (Signed)
Pt needs metoprolol tart. 25mg  1/2 po bid, effient 10mg  daily, [facility]

## 2012-04-06 ENCOUNTER — Telehealth: Payer: Self-pay | Admitting: *Deleted

## 2012-04-06 NOTE — Telephone Encounter (Signed)
Pt needs effient 10mg  and metoprolol tart 25mg  1/2 po bid [facility]

## 2012-04-06 NOTE — Telephone Encounter (Signed)
Called and spoke with patient advocate at [facility] and she said that they received rxs already from 03/16/12 and they were profiled because new orders were already shipped to patient on 03/08/12 for both these medications. Patient informed and said he didn't receive these medications and he would call and find out about the transaction.

## 2012-04-23 DIAGNOSIS — N400 Enlarged prostate without lower urinary tract symptoms: Secondary | ICD-10-CM | POA: Diagnosis not present

## 2012-04-23 DIAGNOSIS — E7800 Pure hypercholesterolemia, unspecified: Secondary | ICD-10-CM | POA: Diagnosis not present

## 2012-04-23 DIAGNOSIS — I10 Essential (primary) hypertension: Secondary | ICD-10-CM | POA: Diagnosis not present

## 2012-04-26 ENCOUNTER — Other Ambulatory Visit: Payer: Self-pay | Admitting: *Deleted

## 2012-04-26 DIAGNOSIS — E782 Mixed hyperlipidemia: Secondary | ICD-10-CM

## 2012-04-26 DIAGNOSIS — Z79899 Other long term (current) drug therapy: Secondary | ICD-10-CM

## 2012-05-03 ENCOUNTER — Ambulatory Visit: Payer: Medicare Other | Admitting: Cardiology

## 2012-05-05 ENCOUNTER — Ambulatory Visit: Payer: Medicare Other | Admitting: Cardiology

## 2012-05-05 ENCOUNTER — Other Ambulatory Visit: Payer: Self-pay | Admitting: Cardiology

## 2012-05-05 DIAGNOSIS — M1990 Unspecified osteoarthritis, unspecified site: Secondary | ICD-10-CM | POA: Diagnosis not present

## 2012-05-05 DIAGNOSIS — E7800 Pure hypercholesterolemia, unspecified: Secondary | ICD-10-CM | POA: Diagnosis not present

## 2012-05-05 DIAGNOSIS — I10 Essential (primary) hypertension: Secondary | ICD-10-CM | POA: Diagnosis not present

## 2012-05-05 DIAGNOSIS — E781 Pure hyperglyceridemia: Secondary | ICD-10-CM | POA: Diagnosis not present

## 2012-05-05 DIAGNOSIS — G4733 Obstructive sleep apnea (adult) (pediatric): Secondary | ICD-10-CM | POA: Diagnosis not present

## 2012-05-05 MED ORDER — METOPROLOL TARTRATE 25 MG PO TABS: ORAL_TABLET | ORAL | Status: DC

## 2012-05-06 ENCOUNTER — Other Ambulatory Visit: Payer: Self-pay | Admitting: Cardiology

## 2012-05-06 MED ORDER — PRASUGREL HCL 10 MG PO TABS: 10.0000 mg | ORAL_TABLET | Freq: Every day | ORAL | Status: DC

## 2012-05-07 DIAGNOSIS — N529 Male erectile dysfunction, unspecified: Secondary | ICD-10-CM | POA: Diagnosis not present

## 2012-05-07 DIAGNOSIS — C61 Malignant neoplasm of prostate: Secondary | ICD-10-CM | POA: Diagnosis not present

## 2012-06-02 ENCOUNTER — Ambulatory Visit (INDEPENDENT_AMBULATORY_CARE_PROVIDER_SITE_OTHER): Payer: Medicare Other | Admitting: Cardiology

## 2012-06-02 ENCOUNTER — Encounter: Payer: Self-pay | Admitting: Cardiology

## 2012-06-02 VITALS — BP 101/67 | HR 60 | Ht 67.0 in | Wt 184.0 lb

## 2012-06-02 DIAGNOSIS — I10 Essential (primary) hypertension: Secondary | ICD-10-CM | POA: Diagnosis not present

## 2012-06-02 DIAGNOSIS — I2510 Atherosclerotic heart disease of native coronary artery without angina pectoris: Secondary | ICD-10-CM

## 2012-06-02 DIAGNOSIS — E782 Mixed hyperlipidemia: Secondary | ICD-10-CM | POA: Diagnosis not present

## 2012-06-02 MED ORDER — METOPROLOL TARTRATE 25 MG PO TABS: ORAL_TABLET | ORAL | Status: DC

## 2012-06-02 NOTE — Assessment & Plan Note (Signed)
Symptomatically stable on medical therapy. ECG is normal. No changes made today. Refill given for metoprolol.

## 2012-06-02 NOTE — Assessment & Plan Note (Signed)
Blood pressure very well controlled. 

## 2012-06-02 NOTE — Assessment & Plan Note (Signed)
We discussed diet and exercise. He was unable to tolerate statins previously. Continue current regimen.

## 2012-06-02 NOTE — Patient Instructions (Signed)

## 2012-06-02 NOTE — Progress Notes (Signed)
Clinical Summary Kevin Mathis is a 76 y.o.male presenting for followup. He was seen in November 2012. He reports no angina, stable dyspnea on exertion. Has not been exercising with any regularity. We discussed this today.  Labwork from April showed cholesterol 192, triglycerides 261, HDL 32, LDL 108, AST 31, and ALT 47. Creatinine 1.0 with GFR 52.  We reviewed his medications. He needed a refill on metoprolol. Reports no major bleeding problems.  Followup ECG today is normal.   Allergies  Allergen Reactions  . Levofloxacin     REACTION: rash  . Penicillins     REACTION: hives  . Statins Other (See Comments)    aching    Current Outpatient Prescriptions  Medication Sig Dispense Refill  . amLODipine (NORVASC) 5 MG tablet Take 1 tablet (5 mg total) by mouth daily.  90 tablet  3  . aspirin 81 MG tablet Take 81 mg by mouth daily.        . cevimeline (EVOXAC) 30 MG capsule Take 30 mg by mouth 2 (two) times daily.        . colchicine 0.6 MG tablet Take 0.6 mg by mouth 2 (two) times daily.        . metoprolol tartrate (LOPRESSOR) 25 MG tablet Take 1/2 tablet by mouth twice per day.  90 tablet  3  . Multiple Vitamin (MULTIVITAMIN) tablet Take 1 tablet by mouth daily.        . nitroGLYCERIN (NITROSTAT) 0.4 MG SL tablet Place 1 tablet (0.4 mg total) under the tongue every 5 (five) minutes as needed.  25 tablet  3  . NON FORMULARY CPAP Use as directed       . omega-3 acid ethyl esters (LOVAZA) 1 G capsule Take 2 capsules (2 g total) by mouth 2 (two) times daily.  360 capsule  3  . pantoprazole (PROTONIX) 40 MG tablet Take 1 tablet (40 mg total) by mouth 2 (two) times daily.  180 tablet  3  . prasugrel (EFFIENT) 10 MG TABS Take 1 tablet (10 mg total) by mouth daily.  90 tablet  3  . zinc gluconate 50 MG tablet Take 50 mg by mouth 2 (two) times daily.       Marland Kitchen DISCONTD: metoprolol tartrate (LOPRESSOR) 25 MG tablet Take 1/2 tablet by mouth twice per day.  90 tablet  1    Past Medical History    Diagnosis Date  . Coronary atherosclerosis of native coronary artery     DES RCA 5/11, LVEF 55%  . Hiatal hernia   . Pulmonary nodules   . Obstructive sleep apnea   . Prostate cancer   . Essential hypertension, benign   . Mixed hyperlipidemia   . GERD (gastroesophageal reflux disease)     Social History Kevin Mathis reports that he quit smoking about 20 years ago. His smoking use included Cigarettes. He has a 40 pack-year smoking history. He has never used smokeless tobacco. Kevin Mathis reports that he drinks alcohol.  Review of Systems No palpitations, good appetite. No orthopnea or PND. No syncope. Otherwise negative.  Physical Examination Filed Vitals:   06/02/12 1330  BP: 101/67  Pulse: 60    Overweight male no acute distress.  HEENT: Conjunctiva and lids normal, oropharynx clear.  Neck: Supple, elevated JVP or bruits.  Lungs: Clear to auscultation, nonlabored.  Cardiac: Regular rate and rhythm, no S3.  Abdomen: Soft, nontender, bowel sounds present.  Musculoskeletal: No kyphosis.  Extremities: No pitting edema.   ECG Normal  sinus rhythm.  Problem List and Plan   CORONARY ATHEROSCLEROSIS NATIVE CORONARY ARTERY Symptomatically stable on medical therapy. ECG is normal. No changes made today. Refill given for metoprolol.  ESSENTIAL HYPERTENSION, BENIGN Blood pressure very well controlled.  MIXED HYPERLIPIDEMIA We discussed diet and exercise. He was unable to tolerate statins previously. Continue current regimen.     Jonelle Sidle, M.D., F.A.C.C.

## 2012-06-04 DIAGNOSIS — D2370 Other benign neoplasm of skin of unspecified lower limb, including hip: Secondary | ICD-10-CM | POA: Diagnosis not present

## 2012-06-04 DIAGNOSIS — D235 Other benign neoplasm of skin of trunk: Secondary | ICD-10-CM | POA: Diagnosis not present

## 2012-06-04 DIAGNOSIS — L439 Lichen planus, unspecified: Secondary | ICD-10-CM | POA: Diagnosis not present

## 2012-06-04 DIAGNOSIS — L821 Other seborrheic keratosis: Secondary | ICD-10-CM | POA: Diagnosis not present

## 2012-06-04 DIAGNOSIS — L570 Actinic keratosis: Secondary | ICD-10-CM | POA: Diagnosis not present

## 2012-06-04 DIAGNOSIS — D485 Neoplasm of uncertain behavior of skin: Secondary | ICD-10-CM | POA: Diagnosis not present

## 2012-07-05 DIAGNOSIS — H35359 Cystoid macular degeneration, unspecified eye: Secondary | ICD-10-CM | POA: Diagnosis not present

## 2012-07-05 DIAGNOSIS — H35379 Puckering of macula, unspecified eye: Secondary | ICD-10-CM | POA: Diagnosis not present

## 2012-08-05 DIAGNOSIS — H52229 Regular astigmatism, unspecified eye: Secondary | ICD-10-CM | POA: Diagnosis not present

## 2012-08-05 DIAGNOSIS — H5200 Hypermetropia, unspecified eye: Secondary | ICD-10-CM | POA: Diagnosis not present

## 2012-08-05 DIAGNOSIS — Z961 Presence of intraocular lens: Secondary | ICD-10-CM | POA: Diagnosis not present

## 2012-08-05 DIAGNOSIS — H35379 Puckering of macula, unspecified eye: Secondary | ICD-10-CM | POA: Diagnosis not present

## 2012-09-01 ENCOUNTER — Other Ambulatory Visit: Payer: Self-pay | Admitting: Cardiology

## 2012-09-01 DIAGNOSIS — E7800 Pure hypercholesterolemia, unspecified: Secondary | ICD-10-CM | POA: Diagnosis not present

## 2012-09-01 DIAGNOSIS — I10 Essential (primary) hypertension: Secondary | ICD-10-CM | POA: Diagnosis not present

## 2012-09-07 DIAGNOSIS — E781 Pure hyperglyceridemia: Secondary | ICD-10-CM | POA: Diagnosis not present

## 2012-09-07 DIAGNOSIS — R42 Dizziness and giddiness: Secondary | ICD-10-CM | POA: Diagnosis not present

## 2012-09-07 DIAGNOSIS — G4733 Obstructive sleep apnea (adult) (pediatric): Secondary | ICD-10-CM | POA: Diagnosis not present

## 2012-09-07 DIAGNOSIS — I10 Essential (primary) hypertension: Secondary | ICD-10-CM | POA: Diagnosis not present

## 2012-09-07 DIAGNOSIS — E7800 Pure hypercholesterolemia, unspecified: Secondary | ICD-10-CM | POA: Diagnosis not present

## 2012-09-17 DIAGNOSIS — R42 Dizziness and giddiness: Secondary | ICD-10-CM | POA: Diagnosis not present

## 2012-09-17 DIAGNOSIS — R5383 Other fatigue: Secondary | ICD-10-CM | POA: Diagnosis not present

## 2012-09-17 DIAGNOSIS — R5381 Other malaise: Secondary | ICD-10-CM | POA: Diagnosis not present

## 2012-09-17 DIAGNOSIS — H81399 Other peripheral vertigo, unspecified ear: Secondary | ICD-10-CM | POA: Diagnosis not present

## 2012-10-11 ENCOUNTER — Telehealth: Payer: Self-pay | Admitting: *Deleted

## 2012-10-11 DIAGNOSIS — R209 Unspecified disturbances of skin sensation: Secondary | ICD-10-CM | POA: Diagnosis not present

## 2012-10-11 DIAGNOSIS — G619 Inflammatory polyneuropathy, unspecified: Secondary | ICD-10-CM | POA: Diagnosis not present

## 2012-10-11 DIAGNOSIS — R5381 Other malaise: Secondary | ICD-10-CM | POA: Diagnosis not present

## 2012-10-11 DIAGNOSIS — R5383 Other fatigue: Secondary | ICD-10-CM | POA: Diagnosis not present

## 2012-10-11 DIAGNOSIS — H81399 Other peripheral vertigo, unspecified ear: Secondary | ICD-10-CM | POA: Diagnosis not present

## 2012-10-11 DIAGNOSIS — R42 Dizziness and giddiness: Secondary | ICD-10-CM | POA: Diagnosis not present

## 2012-10-11 DIAGNOSIS — Z23 Encounter for immunization: Secondary | ICD-10-CM | POA: Diagnosis not present

## 2012-10-11 NOTE — Telephone Encounter (Signed)
Seen Dr. Neita Carp for dizziness twice including todays visit for dizziness and pain in toes. Patient say's he has a very uncomfortable feeling in toes especially at night. Patient has been referred to a neurologist by Dr. Neita Carp for this and wants to know what cardiologists thought of this.

## 2012-10-11 NOTE — Telephone Encounter (Signed)
Patient and wife informed. Patient denies chest pain or sob. Patient offered earlier appointment for dizziness assessment but wants to see neurologist and or ENT first and will call if earlier appointment needed.

## 2012-10-11 NOTE — Telephone Encounter (Signed)
Does not seem unreasonable - Dr. Neita Carp must be concerned about possible neuropathy. Otherwise not sure what to make of these symptoms based on limited information.

## 2012-10-18 DIAGNOSIS — R42 Dizziness and giddiness: Secondary | ICD-10-CM | POA: Diagnosis not present

## 2012-10-18 DIAGNOSIS — H905 Unspecified sensorineural hearing loss: Secondary | ICD-10-CM | POA: Diagnosis not present

## 2012-10-21 DIAGNOSIS — G609 Hereditary and idiopathic neuropathy, unspecified: Secondary | ICD-10-CM | POA: Diagnosis not present

## 2012-10-21 DIAGNOSIS — I10 Essential (primary) hypertension: Secondary | ICD-10-CM | POA: Diagnosis not present

## 2012-10-21 DIAGNOSIS — I2510 Atherosclerotic heart disease of native coronary artery without angina pectoris: Secondary | ICD-10-CM | POA: Diagnosis not present

## 2012-10-21 DIAGNOSIS — M359 Systemic involvement of connective tissue, unspecified: Secondary | ICD-10-CM | POA: Diagnosis not present

## 2012-10-28 DIAGNOSIS — R209 Unspecified disturbances of skin sensation: Secondary | ICD-10-CM | POA: Diagnosis not present

## 2012-10-28 DIAGNOSIS — I2510 Atherosclerotic heart disease of native coronary artery without angina pectoris: Secondary | ICD-10-CM | POA: Diagnosis not present

## 2012-10-28 DIAGNOSIS — M359 Systemic involvement of connective tissue, unspecified: Secondary | ICD-10-CM | POA: Diagnosis not present

## 2012-10-28 DIAGNOSIS — G609 Hereditary and idiopathic neuropathy, unspecified: Secondary | ICD-10-CM | POA: Diagnosis not present

## 2012-11-27 ENCOUNTER — Other Ambulatory Visit: Payer: Self-pay | Admitting: Cardiology

## 2012-11-30 ENCOUNTER — Encounter: Payer: Self-pay | Admitting: Cardiology

## 2012-11-30 ENCOUNTER — Other Ambulatory Visit: Payer: Self-pay | Admitting: *Deleted

## 2012-11-30 ENCOUNTER — Ambulatory Visit (INDEPENDENT_AMBULATORY_CARE_PROVIDER_SITE_OTHER): Payer: Medicare Other | Admitting: Cardiology

## 2012-11-30 VITALS — BP 113/73 | HR 62 | Ht 67.0 in | Wt 183.0 lb

## 2012-11-30 DIAGNOSIS — E782 Mixed hyperlipidemia: Secondary | ICD-10-CM

## 2012-11-30 DIAGNOSIS — I10 Essential (primary) hypertension: Secondary | ICD-10-CM | POA: Diagnosis not present

## 2012-11-30 DIAGNOSIS — I2510 Atherosclerotic heart disease of native coronary artery without angina pectoris: Secondary | ICD-10-CM

## 2012-11-30 MED ORDER — METOPROLOL TARTRATE 25 MG PO TABS: ORAL_TABLET | ORAL | Status: DC

## 2012-11-30 MED ORDER — AMLODIPINE BESYLATE 5 MG PO TABS: 5.0000 mg | ORAL_TABLET | Freq: Every day | ORAL | Status: DC

## 2012-11-30 MED ORDER — OMEGA-3-ACID ETHYL ESTERS 1 G PO CAPS: 2.0000 g | ORAL_CAPSULE | Freq: Two times a day (BID) | ORAL | Status: DC

## 2012-11-30 NOTE — Patient Instructions (Addendum)
Your physician recommends that you schedule a follow-up appointment in: 6 months. You will receive a reminder letter in the mail in about 4 months reminding you to call and schedule your appointment. If you don't receive this letter, please contact our office.  Your physician has recommended you make the following change in your medication: Please stop your effient after your current supply is finished. All other medications will remain the same.

## 2012-11-30 NOTE — Assessment & Plan Note (Signed)
Blood pressure well-controlled today. 

## 2012-11-30 NOTE — Progress Notes (Signed)
Clinical Summary Kevin Mathis is a 76 y.o.male presenting for followup. He was last seen in June. He continues to do very well. Has been traveling recently. No regular exercise, but he is not reporting any significant exertional symptoms with typical ADLs. He has had no bleeding episodes on DAPT. Lipids have been followed by Dr. Neita Carp. He has had previous statin intolerance.  ECG today shows sinus bradycardia with no significant ST segment changes.   Allergies  Allergen Reactions  . Levofloxacin     REACTION: rash  . Penicillins     REACTION: hives  . Statins Other (See Comments)    aching    Current Outpatient Prescriptions  Medication Sig Dispense Refill  . amLODipine (NORVASC) 5 MG tablet TAKE 1 TABLET BY MOUTH DAILY  90 tablet  3  . aspirin 81 MG tablet Take 81 mg by mouth daily.        . cevimeline (EVOXAC) 30 MG capsule Take 30 mg by mouth 2 (two) times daily.        . colchicine 0.6 MG tablet Take 0.6 mg by mouth 2 (two) times daily.        Marland Kitchen LOVAZA 1 G capsule TAKE 2 CAPSULES (2 GRAMS) BY MOUTH TWICE DAILY  360 capsule  3  . metoprolol tartrate (LOPRESSOR) 25 MG tablet Take 1/2 tablet by mouth twice per day.  90 tablet  3  . Multiple Vitamin (MULTIVITAMIN) tablet Take 1 tablet by mouth daily.        . nitroGLYCERIN (NITROSTAT) 0.4 MG SL tablet Place 1 tablet (0.4 mg total) under the tongue every 5 (five) minutes as needed.  25 tablet  3  . NON FORMULARY CPAP Use as directed       . pantoprazole (PROTONIX) 40 MG tablet Take 1 tablet (40 mg total) by mouth 2 (two) times daily.  180 tablet  3  . prasugrel (EFFIENT) 10 MG TABS Take 10 mg by mouth daily. Stop after current supply finished.      . zinc gluconate 50 MG tablet Take 50 mg by mouth 2 (two) times daily.         Past Medical History  Diagnosis Date  . Coronary atherosclerosis of native coronary artery     DES RCA 5/11, LVEF 55%  . Hiatal hernia   . Pulmonary nodules   . Obstructive sleep apnea   . Prostate  cancer   . Essential hypertension, benign   . Mixed hyperlipidemia   . GERD (gastroesophageal reflux disease)     Social History Kevin Mathis reports that he quit smoking about 20 years ago. His smoking use included Cigarettes. He has a 40 pack-year smoking history. He has never used smokeless tobacco. Kevin Mathis reports that he drinks alcohol.  Review of Systems No palpitations. Stable appetite. No claudication  No melena or hematochezia. Otherwise negative.  Physical Examination Filed Vitals:   11/30/12 0814  BP: 113/73  Pulse: 62   Filed Weights   11/30/12 0814  Weight: 183 lb (83.008 kg)    Overweight male no acute distress.  HEENT: Conjunctiva and lids normal, oropharynx clear.  Neck: Supple, elevated JVP or bruits.  Lungs: Clear to auscultation, nonlabored.  Cardiac: Regular rate and rhythm, no S3.  Abdomen: Soft, nontender, bowel sounds present.  Musculoskeletal: No kyphosis.  Extremities: No pitting edema.  Problem List and Plan   CORONARY ATHEROSCLEROSIS NATIVE CORONARY ARTERY Symptomatically stable on present medical regimen. I encouraged more regular exercise, possibly a walking regimen.  Otherwise he will plan to discontinue Effient after he completes the current bottle, and stay on aspirin indefinitely. Followup arranged in 6 months.  ESSENTIAL HYPERTENSION, BENIGN Blood pressure well controlled today.  MIXED HYPERLIPIDEMIA Lipids have been followed by Dr. Neita Carp. Will request copy of most recent assessment.    Jonelle Sidle, M.D., F.A.C.C.

## 2012-11-30 NOTE — Assessment & Plan Note (Signed)
Lipids have been followed by Dr. Neita Carp. Will request copy of most recent assessment.

## 2012-11-30 NOTE — Assessment & Plan Note (Signed)
Symptomatically stable on present medical regimen. I encouraged more regular exercise, possibly a walking regimen. Otherwise he will plan to discontinue Effient after he completes the current bottle, and stay on aspirin indefinitely. Followup arranged in 6 months.

## 2012-12-01 DIAGNOSIS — L501 Idiopathic urticaria: Secondary | ICD-10-CM | POA: Diagnosis not present

## 2012-12-01 DIAGNOSIS — L280 Lichen simplex chronicus: Secondary | ICD-10-CM | POA: Diagnosis not present

## 2012-12-01 DIAGNOSIS — L821 Other seborrheic keratosis: Secondary | ICD-10-CM | POA: Diagnosis not present

## 2012-12-01 DIAGNOSIS — L570 Actinic keratosis: Secondary | ICD-10-CM | POA: Diagnosis not present

## 2012-12-23 DIAGNOSIS — M7020 Olecranon bursitis, unspecified elbow: Secondary | ICD-10-CM | POA: Diagnosis not present

## 2013-01-01 ENCOUNTER — Other Ambulatory Visit: Payer: Self-pay | Admitting: Cardiology

## 2013-01-03 DIAGNOSIS — H35379 Puckering of macula, unspecified eye: Secondary | ICD-10-CM | POA: Diagnosis not present

## 2013-01-03 DIAGNOSIS — H35359 Cystoid macular degeneration, unspecified eye: Secondary | ICD-10-CM | POA: Diagnosis not present

## 2013-01-03 NOTE — Telephone Encounter (Signed)
Please send further refill requests to PCP

## 2013-01-05 DIAGNOSIS — M7020 Olecranon bursitis, unspecified elbow: Secondary | ICD-10-CM | POA: Diagnosis not present

## 2013-02-08 ENCOUNTER — Telehealth: Payer: Self-pay | Admitting: Cardiology

## 2013-02-08 NOTE — Telephone Encounter (Signed)
Left message on voicemail of patient informing him that its okay for him not to take the lovaza for a few days since he forgot it.

## 2013-02-08 NOTE — Telephone Encounter (Signed)
[name] is out of town and he does not have his Lovaza. Wants to know if it is ok for him to be off of this medication Until he gets back into town.

## 2013-03-03 DIAGNOSIS — I10 Essential (primary) hypertension: Secondary | ICD-10-CM | POA: Diagnosis not present

## 2013-03-03 DIAGNOSIS — E7800 Pure hypercholesterolemia, unspecified: Secondary | ICD-10-CM | POA: Diagnosis not present

## 2013-03-08 DIAGNOSIS — N189 Chronic kidney disease, unspecified: Secondary | ICD-10-CM | POA: Diagnosis not present

## 2013-03-08 DIAGNOSIS — I10 Essential (primary) hypertension: Secondary | ICD-10-CM | POA: Diagnosis not present

## 2013-03-08 DIAGNOSIS — G4733 Obstructive sleep apnea (adult) (pediatric): Secondary | ICD-10-CM | POA: Diagnosis not present

## 2013-03-08 DIAGNOSIS — E781 Pure hyperglyceridemia: Secondary | ICD-10-CM | POA: Diagnosis not present

## 2013-03-08 DIAGNOSIS — E7800 Pure hypercholesterolemia, unspecified: Secondary | ICD-10-CM | POA: Diagnosis not present

## 2013-03-12 ENCOUNTER — Other Ambulatory Visit: Payer: Self-pay | Admitting: Cardiology

## 2013-03-14 ENCOUNTER — Telehealth: Payer: Self-pay | Admitting: Cardiology

## 2013-03-14 DIAGNOSIS — Z961 Presence of intraocular lens: Secondary | ICD-10-CM | POA: Diagnosis not present

## 2013-03-14 DIAGNOSIS — H26499 Other secondary cataract, unspecified eye: Secondary | ICD-10-CM | POA: Diagnosis not present

## 2013-03-14 NOTE — Telephone Encounter (Signed)
I reviewed Dr McDowell's OV note of 11/2012. He recommended pt stop taking Effient, after he completed the remaining supply in the bottle he was using at that time. Therefore, I would suggest that if he has, indeed, finished that particular bottle, then there would be no need for him to open another bottle of Effient. Additionally, there is no longer any reason for [place] to refill his prescription for this medication.

## 2013-03-14 NOTE — Telephone Encounter (Signed)
Patient seen Dr. Diona Browner in December. He was told to finish his Effient supply and then stop it. I received a refill request for medication and contacted pt to see if he was still taking the medication. He said he was and he was aware to stop it when he completed medication. He said he still had at least another 90 day supply left that is unopened and wants to know should he continue to take medication or go ahead and stop the medication.

## 2013-03-14 NOTE — Telephone Encounter (Signed)
Pt informed not to finish his effient supply

## 2013-03-15 NOTE — Addendum Note (Signed)
Addended by: Reather Laurence A on: 03/15/2013 09:23 AM   Modules accepted: Orders, Medications

## 2013-03-19 DIAGNOSIS — M7020 Olecranon bursitis, unspecified elbow: Secondary | ICD-10-CM | POA: Diagnosis not present

## 2013-05-30 DIAGNOSIS — J209 Acute bronchitis, unspecified: Secondary | ICD-10-CM | POA: Diagnosis not present

## 2013-06-07 DIAGNOSIS — C61 Malignant neoplasm of prostate: Secondary | ICD-10-CM | POA: Diagnosis not present

## 2013-06-16 ENCOUNTER — Other Ambulatory Visit: Payer: Self-pay | Admitting: Cardiology

## 2013-06-20 NOTE — Telephone Encounter (Signed)
[place] pt. 

## 2013-06-28 ENCOUNTER — Telehealth: Payer: Self-pay | Admitting: Cardiology

## 2013-06-28 NOTE — Telephone Encounter (Signed)
Are we actually filling his protonix, usually this is taken care of by the primary care provider. If we have been filling it, then it would be reasonable for him to take something different, possibly either Prilosec 20 mg daily or Nexium 20 mg daily.

## 2013-06-28 NOTE — Telephone Encounter (Signed)
Patient called and said he got a letter in the mail stating that protonix will not be available for a while. [facility] is the only pharmacy that his insurance will cover and he wants to know if he can have a new prescription for another medicine? A good number to contact him back on is [phone].

## 2013-06-29 NOTE — Telephone Encounter (Signed)
Patient informed and states he has plenty of nexium at home and will take this until protonix is available Patient said he doesn't need it called in at this time.

## 2013-08-09 DIAGNOSIS — L280 Lichen simplex chronicus: Secondary | ICD-10-CM | POA: Diagnosis not present

## 2013-08-09 DIAGNOSIS — L501 Idiopathic urticaria: Secondary | ICD-10-CM | POA: Diagnosis not present

## 2013-08-09 DIAGNOSIS — Z79899 Other long term (current) drug therapy: Secondary | ICD-10-CM | POA: Diagnosis not present

## 2013-08-10 DIAGNOSIS — H00029 Hordeolum internum unspecified eye, unspecified eyelid: Secondary | ICD-10-CM | POA: Diagnosis not present

## 2013-08-30 DIAGNOSIS — I10 Essential (primary) hypertension: Secondary | ICD-10-CM | POA: Diagnosis not present

## 2013-08-30 DIAGNOSIS — E7800 Pure hypercholesterolemia, unspecified: Secondary | ICD-10-CM | POA: Diagnosis not present

## 2013-09-01 DIAGNOSIS — R7989 Other specified abnormal findings of blood chemistry: Secondary | ICD-10-CM | POA: Diagnosis not present

## 2013-09-01 DIAGNOSIS — E781 Pure hyperglyceridemia: Secondary | ICD-10-CM | POA: Diagnosis not present

## 2013-09-01 DIAGNOSIS — R945 Abnormal results of liver function studies: Secondary | ICD-10-CM | POA: Diagnosis not present

## 2013-09-01 DIAGNOSIS — I10 Essential (primary) hypertension: Secondary | ICD-10-CM | POA: Diagnosis not present

## 2013-09-01 DIAGNOSIS — E7800 Pure hypercholesterolemia, unspecified: Secondary | ICD-10-CM | POA: Diagnosis not present

## 2013-11-14 DIAGNOSIS — G5760 Lesion of plantar nerve, unspecified lower limb: Secondary | ICD-10-CM | POA: Diagnosis not present

## 2013-11-24 ENCOUNTER — Other Ambulatory Visit: Payer: Self-pay | Admitting: Cardiology

## 2013-12-21 DIAGNOSIS — J0100 Acute maxillary sinusitis, unspecified: Secondary | ICD-10-CM | POA: Diagnosis not present

## 2014-02-03 DIAGNOSIS — E785 Hyperlipidemia, unspecified: Secondary | ICD-10-CM | POA: Diagnosis not present

## 2014-02-03 DIAGNOSIS — M25569 Pain in unspecified knee: Secondary | ICD-10-CM | POA: Diagnosis not present

## 2014-02-03 DIAGNOSIS — M12869 Other specific arthropathies, not elsewhere classified, unspecified knee: Secondary | ICD-10-CM | POA: Diagnosis not present

## 2014-02-03 DIAGNOSIS — M1710 Unilateral primary osteoarthritis, unspecified knee: Secondary | ICD-10-CM | POA: Diagnosis not present

## 2014-02-03 DIAGNOSIS — I2510 Atherosclerotic heart disease of native coronary artery without angina pectoris: Secondary | ICD-10-CM | POA: Diagnosis not present

## 2014-02-03 DIAGNOSIS — I10 Essential (primary) hypertension: Secondary | ICD-10-CM | POA: Diagnosis not present

## 2014-02-03 DIAGNOSIS — IMO0002 Reserved for concepts with insufficient information to code with codable children: Secondary | ICD-10-CM | POA: Diagnosis not present

## 2014-02-06 DIAGNOSIS — Z79899 Other long term (current) drug therapy: Secondary | ICD-10-CM | POA: Diagnosis not present

## 2014-02-06 DIAGNOSIS — L280 Lichen simplex chronicus: Secondary | ICD-10-CM | POA: Diagnosis not present

## 2014-02-06 DIAGNOSIS — L501 Idiopathic urticaria: Secondary | ICD-10-CM | POA: Diagnosis not present

## 2014-02-07 DIAGNOSIS — R5381 Other malaise: Secondary | ICD-10-CM | POA: Diagnosis not present

## 2014-02-07 DIAGNOSIS — M1990 Unspecified osteoarthritis, unspecified site: Secondary | ICD-10-CM | POA: Diagnosis not present

## 2014-02-07 DIAGNOSIS — R42 Dizziness and giddiness: Secondary | ICD-10-CM | POA: Diagnosis not present

## 2014-02-09 ENCOUNTER — Other Ambulatory Visit: Payer: Self-pay | Admitting: Cardiology

## 2014-02-09 NOTE — Telephone Encounter (Signed)
[place] pt overdue for follow up

## 2014-02-28 DIAGNOSIS — E7800 Pure hypercholesterolemia, unspecified: Secondary | ICD-10-CM | POA: Diagnosis not present

## 2014-02-28 DIAGNOSIS — I10 Essential (primary) hypertension: Secondary | ICD-10-CM | POA: Diagnosis not present

## 2014-02-28 DIAGNOSIS — N400 Enlarged prostate without lower urinary tract symptoms: Secondary | ICD-10-CM | POA: Diagnosis not present

## 2014-02-28 DIAGNOSIS — R5381 Other malaise: Secondary | ICD-10-CM | POA: Diagnosis not present

## 2014-03-02 DIAGNOSIS — E7800 Pure hypercholesterolemia, unspecified: Secondary | ICD-10-CM | POA: Diagnosis not present

## 2014-03-02 DIAGNOSIS — G4733 Obstructive sleep apnea (adult) (pediatric): Secondary | ICD-10-CM | POA: Diagnosis not present

## 2014-03-02 DIAGNOSIS — Z23 Encounter for immunization: Secondary | ICD-10-CM | POA: Diagnosis not present

## 2014-03-02 DIAGNOSIS — I10 Essential (primary) hypertension: Secondary | ICD-10-CM | POA: Diagnosis not present

## 2014-03-02 DIAGNOSIS — E781 Pure hyperglyceridemia: Secondary | ICD-10-CM | POA: Diagnosis not present

## 2014-03-02 DIAGNOSIS — R7989 Other specified abnormal findings of blood chemistry: Secondary | ICD-10-CM | POA: Diagnosis not present

## 2014-03-02 DIAGNOSIS — K2100 Gastro-esophageal reflux disease with esophagitis, without bleeding: Secondary | ICD-10-CM | POA: Diagnosis not present

## 2014-03-02 DIAGNOSIS — N189 Chronic kidney disease, unspecified: Secondary | ICD-10-CM | POA: Diagnosis not present

## 2014-03-16 DIAGNOSIS — H35379 Puckering of macula, unspecified eye: Secondary | ICD-10-CM | POA: Diagnosis not present

## 2014-03-16 DIAGNOSIS — H35359 Cystoid macular degeneration, unspecified eye: Secondary | ICD-10-CM | POA: Diagnosis not present

## 2014-03-20 ENCOUNTER — Ambulatory Visit: Payer: Medicare Other | Admitting: Cardiology

## 2014-03-20 DIAGNOSIS — H353190 Nonexudative age-related macular degeneration, unspecified eye, stage unspecified: Secondary | ICD-10-CM | POA: Diagnosis not present

## 2014-03-20 DIAGNOSIS — H52229 Regular astigmatism, unspecified eye: Secondary | ICD-10-CM | POA: Diagnosis not present

## 2014-03-20 DIAGNOSIS — H524 Presbyopia: Secondary | ICD-10-CM | POA: Diagnosis not present

## 2014-03-20 DIAGNOSIS — H5200 Hypermetropia, unspecified eye: Secondary | ICD-10-CM | POA: Diagnosis not present

## 2014-04-13 DIAGNOSIS — M659 Synovitis and tenosynovitis, unspecified: Secondary | ICD-10-CM | POA: Diagnosis not present

## 2014-04-17 DIAGNOSIS — Z800 Family history of malignant neoplasm of digestive organs: Secondary | ICD-10-CM | POA: Diagnosis not present

## 2014-04-26 DIAGNOSIS — B3749 Other urogenital candidiasis: Secondary | ICD-10-CM | POA: Diagnosis not present

## 2014-04-28 HISTORY — PX: COLONOSCOPY: SHX174

## 2014-05-10 DIAGNOSIS — Z880 Allergy status to penicillin: Secondary | ICD-10-CM | POA: Diagnosis not present

## 2014-05-10 DIAGNOSIS — K219 Gastro-esophageal reflux disease without esophagitis: Secondary | ICD-10-CM | POA: Diagnosis not present

## 2014-05-10 DIAGNOSIS — I2510 Atherosclerotic heart disease of native coronary artery without angina pectoris: Secondary | ICD-10-CM | POA: Diagnosis not present

## 2014-05-10 DIAGNOSIS — Z8546 Personal history of malignant neoplasm of prostate: Secondary | ICD-10-CM | POA: Diagnosis not present

## 2014-05-10 DIAGNOSIS — Z9861 Coronary angioplasty status: Secondary | ICD-10-CM | POA: Diagnosis not present

## 2014-05-10 DIAGNOSIS — Z7982 Long term (current) use of aspirin: Secondary | ICD-10-CM | POA: Diagnosis not present

## 2014-05-10 DIAGNOSIS — Z860100 Personal history of colon polyps, unspecified: Secondary | ICD-10-CM | POA: Diagnosis not present

## 2014-05-10 DIAGNOSIS — I10 Essential (primary) hypertension: Secondary | ICD-10-CM | POA: Diagnosis not present

## 2014-05-10 DIAGNOSIS — Z801 Family history of malignant neoplasm of trachea, bronchus and lung: Secondary | ICD-10-CM | POA: Diagnosis not present

## 2014-05-10 DIAGNOSIS — Z87891 Personal history of nicotine dependence: Secondary | ICD-10-CM | POA: Diagnosis not present

## 2014-05-10 DIAGNOSIS — E785 Hyperlipidemia, unspecified: Secondary | ICD-10-CM | POA: Diagnosis not present

## 2014-05-10 DIAGNOSIS — Z1211 Encounter for screening for malignant neoplasm of colon: Secondary | ICD-10-CM | POA: Diagnosis not present

## 2014-05-10 DIAGNOSIS — K5730 Diverticulosis of large intestine without perforation or abscess without bleeding: Secondary | ICD-10-CM | POA: Diagnosis not present

## 2014-05-10 DIAGNOSIS — Z883 Allergy status to other anti-infective agents status: Secondary | ICD-10-CM | POA: Diagnosis not present

## 2014-05-10 DIAGNOSIS — M109 Gout, unspecified: Secondary | ICD-10-CM | POA: Diagnosis not present

## 2014-05-10 DIAGNOSIS — G4733 Obstructive sleep apnea (adult) (pediatric): Secondary | ICD-10-CM | POA: Diagnosis not present

## 2014-05-10 DIAGNOSIS — Z800 Family history of malignant neoplasm of digestive organs: Secondary | ICD-10-CM | POA: Diagnosis not present

## 2014-05-10 DIAGNOSIS — D126 Benign neoplasm of colon, unspecified: Secondary | ICD-10-CM | POA: Diagnosis not present

## 2014-05-10 DIAGNOSIS — Z79899 Other long term (current) drug therapy: Secondary | ICD-10-CM | POA: Diagnosis not present

## 2014-05-15 ENCOUNTER — Encounter: Payer: Self-pay | Admitting: Cardiology

## 2014-05-15 ENCOUNTER — Ambulatory Visit (INDEPENDENT_AMBULATORY_CARE_PROVIDER_SITE_OTHER): Payer: Medicare Other | Admitting: Cardiology

## 2014-05-15 VITALS — BP 100/63 | HR 59 | Ht 68.0 in | Wt 173.8 lb

## 2014-05-15 DIAGNOSIS — I10 Essential (primary) hypertension: Secondary | ICD-10-CM

## 2014-05-15 DIAGNOSIS — E782 Mixed hyperlipidemia: Secondary | ICD-10-CM

## 2014-05-15 DIAGNOSIS — I2510 Atherosclerotic heart disease of native coronary artery without angina pectoris: Secondary | ICD-10-CM | POA: Diagnosis not present

## 2014-05-15 MED ORDER — AMLODIPINE BESYLATE 5 MG PO TABS: 5.0000 mg | ORAL_TABLET | Freq: Every day | ORAL | Status: DC

## 2014-05-15 MED ORDER — METOPROLOL TARTRATE 25 MG PO TABS: ORAL_TABLET | ORAL | Status: DC

## 2014-05-15 NOTE — Assessment & Plan Note (Signed)
Symptomatically stable on medical therapy, ECG is normal. Continue regular activities. We discussed warning signs. Followup arranged.

## 2014-05-15 NOTE — Assessment & Plan Note (Signed)
Known statin intolerance. LDL is 118. We discussed diet and exercise.

## 2014-05-15 NOTE — Progress Notes (Signed)
Clinical Summary Kevin Mathis is a 78 y.o.male last seen in December 2013. He has been doing relatively well from a cardiac perspective, gets some angina in the form of jaw discomfort to mild degree when he pushes himself too hard, otherwise no regular symptoms. He has not required nitroglycerin.  He has a history of statin intolerance, lipids are followed by Dr. Quintin Alto. Lab work from March showed cholesterol 186, triglycerides 170, HDL 34, LDL 118 (up from 95).  ECG today shows normal sinus rhythm.  Allergies  Allergen Reactions  . Levofloxacin     REACTION: rash  . Penicillins     REACTION: hives  . Statins Other (See Comments)    aching    Current Outpatient Prescriptions  Medication Sig Dispense Refill  . amLODipine (NORVASC) 5 MG tablet Take 1 tablet (5 mg total) by mouth daily.  90 tablet  3  . aspirin 81 MG tablet Take 81 mg by mouth daily.        . cevimeline (EVOXAC) 30 MG capsule Take 30 mg by mouth 2 (two) times daily. May add 1 tab at bedtime as needed.      . colchicine 0.6 MG tablet Take 0.6 mg by mouth 2 (two) times daily.        Marland Kitchen esomeprazole (NEXIUM) 40 MG capsule Take 40 mg by mouth 2 (two) times daily.       Marland Kitchen LOVAZA 1 G capsule TAKE 2 CAPSULES TWICE A DAY (NEEDS OFFICE VISIT)  120 capsule  0  . metoprolol tartrate (LOPRESSOR) 25 MG tablet Take 1/2 tablet by mouth twice per day.  180 tablet  3  . Multiple Vitamin (MULTIVITAMIN) tablet Take 1 tablet by mouth daily.        . nitroGLYCERIN (NITROSTAT) 0.4 MG SL tablet Place 1 tablet (0.4 mg total) under the tongue every 5 (five) minutes as needed.  25 tablet  3  . NON FORMULARY CPAP Use as directed       . vitamin B-12 (CYANOCOBALAMIN) 1000 MCG tablet Take 1,000 mcg by mouth daily.      Marland Kitchen zinc gluconate 50 MG tablet Take 50 mg by mouth 2 (two) times daily.        No current facility-administered medications for this visit.    Past Medical History  Diagnosis Date  . Coronary atherosclerosis of native coronary  artery     DES RCA 5/11, LVEF 55%  . Hiatal hernia   . Pulmonary nodules   . Obstructive sleep apnea   . Prostate cancer   . Essential hypertension, benign   . Mixed hyperlipidemia   . GERD (gastroesophageal reflux disease)     Social History Kevin Mathis reports that he quit smoking about 22 years ago. His smoking use included Cigarettes. He has a 40 pack-year smoking history. He has never used smokeless tobacco. Kevin Mathis reports that he drinks alcohol.  Review of Systems No palpitations, dizziness, syncope. States that he feels good most days. Otherwise negative.  Physical Examination Filed Vitals:   05/15/14 1404  BP: 100/63  Pulse: 59   Filed Weights   05/15/14 1404  Weight: 173 lb 12.8 oz (78.835 kg)    Overweight male no acute distress.  HEENT: Conjunctiva and lids normal, oropharynx clear.  Neck: Supple, elevated JVP or bruits.  Lungs: Clear to auscultation, nonlabored.  Cardiac: Regular rate and rhythm, no S3.  Abdomen: Soft, nontender, bowel sounds present.  Musculoskeletal: No kyphosis.  Extremities: No pitting edema.  Problem List and Plan   CORONARY ATHEROSCLEROSIS NATIVE CORONARY ARTERY Symptomatically stable on medical therapy, ECG is normal. Continue regular activities. We discussed warning signs. Followup arranged.  Mixed hyperlipidemia Known statin intolerance. LDL is 118. We discussed diet and exercise.  Essential hypertension, benign Blood pressure normal today.    Satira Sark, M.D., F.A.C.C.

## 2014-05-15 NOTE — Assessment & Plan Note (Signed)
Blood pressure normal today. 

## 2014-05-15 NOTE — Patient Instructions (Signed)

## 2014-05-16 ENCOUNTER — Other Ambulatory Visit: Payer: Self-pay | Admitting: *Deleted

## 2014-05-16 MED ORDER — METOPROLOL TARTRATE 25 MG PO TABS: ORAL_TABLET | ORAL | Status: DC

## 2014-05-16 MED ORDER — AMLODIPINE BESYLATE 5 MG PO TABS: 5.0000 mg | ORAL_TABLET | Freq: Every day | ORAL | Status: DC

## 2014-06-26 ENCOUNTER — Other Ambulatory Visit: Payer: Self-pay | Admitting: Neurology

## 2014-06-27 ENCOUNTER — Other Ambulatory Visit: Payer: Self-pay | Admitting: *Deleted

## 2014-06-27 MED ORDER — OMEGA-3-ACID ETHYL ESTERS 1 G PO CAPS: 2.0000 g | ORAL_CAPSULE | Freq: Two times a day (BID) | ORAL | Status: DC

## 2014-06-28 ENCOUNTER — Ambulatory Visit: Payer: Self-pay | Admitting: Neurology

## 2014-06-28 ENCOUNTER — Encounter: Payer: Self-pay | Admitting: Neurology

## 2014-06-28 ENCOUNTER — Ambulatory Visit (INDEPENDENT_AMBULATORY_CARE_PROVIDER_SITE_OTHER): Payer: Medicare Other | Admitting: Neurology

## 2014-06-28 VITALS — BP 135/87 | HR 62 | Ht 68.0 in | Wt 182.0 lb

## 2014-06-28 DIAGNOSIS — G609 Hereditary and idiopathic neuropathy, unspecified: Secondary | ICD-10-CM | POA: Diagnosis not present

## 2014-06-28 DIAGNOSIS — G629 Polyneuropathy, unspecified: Secondary | ICD-10-CM | POA: Insufficient documentation

## 2014-06-28 DIAGNOSIS — I2510 Atherosclerotic heart disease of native coronary artery without angina pectoris: Secondary | ICD-10-CM

## 2014-06-28 MED ORDER — GABAPENTIN 100 MG PO CAPS: 300.0000 mg | ORAL_CAPSULE | Freq: Every day | ORAL | Status: DC

## 2014-06-28 NOTE — Progress Notes (Signed)
PATIENT: Kevin Mathis DOB: 29-Jan-1936  HISTORICAL  Kevin Mathis is a 78 years old right-handed Caucasian male, accompanied by his wife, I saw him previously March 2014, for bilateral feet paresthesia, his primary care physician is Dr. Salome Spotted  He had a past medical history of coronary artery disease, hypertension, connective tissue disease of unknown type, he also has chronic whole-body itching, has been on long-term colchicine, 0.6 mg twice a day   Previous laboratory evaluation showed a positive ANA, SSA, but the rest of the laboratory evaluation including TSH, B12, CMP, p-ANCA, CMP, protein electrophoresis, Lyme titer, ACE was normal  Previous electrodiagnostic study also showed no evidence of large fiber peripheral neuropathy. This has led to a skin biopsy, which showed patterns consistent with small fiber neuropathy, with decreased epidural nerve fibers at his feet, and distal leg  He has been taking gabapentin 100 mg one tablet every night as needed along with Tylenol PM 2 tablets every night for his sleep, and bilateral feet paresthesia, his bilateral feet paresthesia overall has been fairly stable, he complains of numbness tingling burning cold sensation at his distal toes, plantar surface, difficulty sleeping sometimes  But over the past 2 months, he seems to have increased night time bilateral feet discomfort, especially after being active during the daytime, his feet was so bothersome, sometime has difficulty sleeping, extra dose of gabapentin was helpful, but he has such sound sleep, sometimes to the point of wetting his bed,   He denies gait difficulty, denies significant low back pain, no bilateral upper extremity paresthesia   REVIEW OF SYSTEMS: Full 14 system review of systems performed and notable only for Hearing loss, restless legs, apnea, according awakening, bruise easily  ALLERGIES: Allergies  Allergen Reactions  . Levofloxacin Rash    Unknown REACTION:  rash  . Penicillins Rash    REACTION: hives  . Statins Other (See Comments) and Rash    aching aching    HOME MEDICATIONS: Current Outpatient Prescriptions on File Prior to Visit  Medication Sig Dispense Refill  . amLODipine (NORVASC) 5 MG tablet Take 1 tablet (5 mg total) by mouth daily.  90 tablet  3  . aspirin 81 MG tablet Take 81 mg by mouth daily.        . cevimeline (EVOXAC) 30 MG capsule Take 30 mg by mouth 2 (two) times daily. May add 1 tab at bedtime as needed.      . colchicine 0.6 MG tablet Take 0.6 mg by mouth 2 (two) times daily.        Marland Kitchen esomeprazole (NEXIUM) 40 MG capsule Take 40 mg by mouth 2 (two) times daily.       . metoprolol tartrate (LOPRESSOR) 25 MG tablet Take 1/2 tablet by mouth twice per day.  180 tablet  3  . Multiple Vitamin (MULTIVITAMIN) tablet Take 1 tablet by mouth daily.        . nitroGLYCERIN (NITROSTAT) 0.4 MG SL tablet Place 1 tablet (0.4 mg total) under the tongue every 5 (five) minutes as needed.  25 tablet  3  . NON FORMULARY CPAP Use as directed       . omega-3 acid ethyl esters (LOVAZA) 1 G capsule Take 2 capsules (2 g total) by mouth 2 (two) times daily.  360 capsule  3  . vitamin B-12 (CYANOCOBALAMIN) 1000 MCG tablet Take 1,000 mcg by mouth daily.      Marland Kitchen zinc gluconate 50 MG tablet Take 50 mg by mouth 2 (two)  times daily.        No current facility-administered medications on file prior to visit.    PAST MEDICAL HISTORY: Past Medical History  Diagnosis Date  . Coronary atherosclerosis of native coronary artery     DES RCA 5/11, LVEF 55%  . Hiatal hernia   . Pulmonary nodules   . Obstructive sleep apnea   . Prostate cancer   . Essential hypertension, benign   . Mixed hyperlipidemia   . GERD (gastroesophageal reflux disease)     PAST SURGICAL HISTORY: Past Surgical History  Procedure Laterality Date  . Appendectomy    . Hemorrhoid surgery    . Prostatectomy    . Laparoscopic cholecystectomy    . Skin cancer resection       FAMILY HISTORY: Family History  Problem Relation Age of Onset  . Hypertension      SOCIAL HISTORY:  History   Social History  . Marital Status: Married    Spouse Name: N/A    Number of Children: N/A  . Years of Education: N/A   Occupational History  . Not on file.   Social History Main Topics  . Smoking status: Former Smoker -- 1.00 packs/day for 40 years    Types: Cigarettes    Quit date: 12/30/1991  . Smokeless tobacco: Never Used  . Alcohol Use: Yes  . Drug Use: No  . Sexual Activity: Not on file   Other Topics Concern  . Not on file   Social History Narrative  . No narrative on file     PHYSICAL EXAM   Filed Vitals:   06/28/14 1136  BP: 135/87  Pulse: 62  Height: 5' 8"  (1.727 m)  Weight: 182 lb (82.555 kg)    Not recorded    Body mass index is 27.68 kg/(m^2).   Generalized: In no acute distress  Neck: Supple, no carotid bruits   Cardiac: Regular rate rhythm  Pulmonary: Clear to auscultation bilaterally  Musculoskeletal: No deformity  Neurological examination  Mentation: Alert oriented to time, place, history taking, and causual conversation  Cranial nerve II-XII: Pupils were equal round reactive to light. Extraocular movements were full.  Visual field were full on confrontational test. Bilateral fundi were sharp.  Facial sensation and strength were normal. Hearing was intact to finger rubbing bilaterally. Uvula tongue midline.  Head turning and shoulder shrug and were normal and symmetric.Tongue protrusion into cheek strength was normal.  Motor: Normal tone, bulk and strength.  Sensory:  mildly length dependent decreased fine touch, pinprick to distal leg, preserved vibratory sensation, and proprioception at toes.  Coordination: Normal finger to nose, heel-to-shin bilaterally there was no truncal ataxia  Gait: Rising up from seated position without assistance, normal stance, without trunk ataxia, moderate stride, good arm swing,  smooth turning, able to perform tiptoe, and heel walking without difficulty.   Romberg signs: Negative  Deep tendon reflexes: Brachioradialis 2/2, biceps 2/2, triceps 2/2, patellar 2/2, Achilles 2/2, plantar responses were flexor bilaterally.   DIAGNOSTIC DATA (LABS, IMAGING, TESTING) - I reviewed patient records, labs, notes, testing and imaging myself where available.  Lab Results  Component Value Date   WBC 6.7 05/09/2010   HGB 12.4* 05/09/2010   HCT 35.1* 05/09/2010   MCV 95.7 05/09/2010   PLT 175 05/09/2010      Component Value Date/Time   NA 139 05/09/2010 0435   K 4.0 05/09/2010 0435   CL 107 05/09/2010 0435   CO2 26 05/09/2010 0435   GLUCOSE 121* 05/09/2010 0435  BUN 6 05/09/2010 0435   CREATININE 1.09 05/09/2010 0435   CALCIUM 8.4 05/09/2010 0435   GFRNONAA >60 05/09/2010 0435   GFRAA  Value: >60        The eGFR has been calculated using the MDRD equation. This calculation has not been validated in all clinical situations. eGFR's persistently <60 mL/min signify possible Chronic Kidney Disease. 05/09/2010 0435   Lab Results  Component Value Date   CHOL  Value: 161        ATP III CLASSIFICATION:  <200     mg/dL   Desirable  200-239  mg/dL   Borderline High  >=240    mg/dL   High        05/08/2010   HDL 27* 05/08/2010   LDLCALC  Value: 84        Total Cholesterol/HDL:CHD Risk Coronary Heart Disease Risk Table                     Men   Women  1/2 Average Risk   3.4   3.3  Average Risk       5.0   4.4  2 X Average Risk   9.6   7.1  3 X Average Risk  23.4   11.0        Use the calculated Patient Ratio above and the CHD Risk Table to determine the patient's CHD Risk.        ATP III CLASSIFICATION (LDL):  <100     mg/dL   Optimal  100-129  mg/dL   Near or Above                    Optimal  130-159  mg/dL   Borderline  160-189  mg/dL   High  >190     mg/dL   Very High 05/08/2010   TRIG 248* 05/08/2010   CHOLHDL 6.0 05/08/2010    ASSESSMENT AND PLAN  Kevin Mathis is a 78 y.o. male  with  evidence of length dependent small fiber peripheral neuropathy, etiology was not sure, but he has been on long-term Colchicine treatment, which could be a culprit I will decrease his colchicine to 0.6 mg every morning, gabapentin, 100 mg, titrating up to 3 tablets every night for his nighttime symptoms, he may wean himself off Tylenol PM  Return to clinic in one year with Kevin Mathis, call office earlier for recurrent symptoms   Marcial Pacas, M.D. Ph.D.  [facility] [address], [place],  [address] [phone]

## 2014-07-10 DIAGNOSIS — C61 Malignant neoplasm of prostate: Secondary | ICD-10-CM | POA: Insufficient documentation

## 2014-07-11 DIAGNOSIS — IMO0002 Reserved for concepts with insufficient information to code with codable children: Secondary | ICD-10-CM | POA: Diagnosis not present

## 2014-07-11 DIAGNOSIS — M7660 Achilles tendinitis, unspecified leg: Secondary | ICD-10-CM | POA: Diagnosis not present

## 2014-07-11 DIAGNOSIS — M7020 Olecranon bursitis, unspecified elbow: Secondary | ICD-10-CM | POA: Diagnosis not present

## 2014-07-18 ENCOUNTER — Encounter: Payer: Self-pay | Admitting: Neurology

## 2014-07-25 DIAGNOSIS — M659 Synovitis and tenosynovitis, unspecified: Secondary | ICD-10-CM | POA: Diagnosis not present

## 2014-07-25 DIAGNOSIS — M129 Arthropathy, unspecified: Secondary | ICD-10-CM | POA: Diagnosis not present

## 2014-08-28 DIAGNOSIS — J320 Chronic maxillary sinusitis: Secondary | ICD-10-CM | POA: Insufficient documentation

## 2014-09-01 DIAGNOSIS — N189 Chronic kidney disease, unspecified: Secondary | ICD-10-CM | POA: Diagnosis not present

## 2014-09-01 DIAGNOSIS — I10 Essential (primary) hypertension: Secondary | ICD-10-CM | POA: Diagnosis not present

## 2014-09-01 DIAGNOSIS — E7800 Pure hypercholesterolemia, unspecified: Secondary | ICD-10-CM | POA: Diagnosis not present

## 2014-09-01 DIAGNOSIS — R7989 Other specified abnormal findings of blood chemistry: Secondary | ICD-10-CM | POA: Diagnosis not present

## 2014-09-06 DIAGNOSIS — I10 Essential (primary) hypertension: Secondary | ICD-10-CM | POA: Diagnosis not present

## 2014-09-06 DIAGNOSIS — E7800 Pure hypercholesterolemia, unspecified: Secondary | ICD-10-CM | POA: Diagnosis not present

## 2014-09-06 DIAGNOSIS — G4733 Obstructive sleep apnea (adult) (pediatric): Secondary | ICD-10-CM | POA: Diagnosis not present

## 2014-09-06 DIAGNOSIS — Z23 Encounter for immunization: Secondary | ICD-10-CM | POA: Diagnosis not present

## 2014-09-06 DIAGNOSIS — R7989 Other specified abnormal findings of blood chemistry: Secondary | ICD-10-CM | POA: Diagnosis not present

## 2014-09-06 DIAGNOSIS — E781 Pure hyperglyceridemia: Secondary | ICD-10-CM | POA: Diagnosis not present

## 2014-09-06 DIAGNOSIS — K2100 Gastro-esophageal reflux disease with esophagitis, without bleeding: Secondary | ICD-10-CM | POA: Diagnosis not present

## 2014-09-06 DIAGNOSIS — N189 Chronic kidney disease, unspecified: Secondary | ICD-10-CM | POA: Diagnosis not present

## 2014-09-11 DIAGNOSIS — L501 Idiopathic urticaria: Secondary | ICD-10-CM | POA: Diagnosis not present

## 2014-09-11 DIAGNOSIS — L439 Lichen planus, unspecified: Secondary | ICD-10-CM | POA: Diagnosis not present

## 2014-09-13 DIAGNOSIS — L439 Lichen planus, unspecified: Secondary | ICD-10-CM | POA: Diagnosis not present

## 2014-09-13 DIAGNOSIS — R894 Abnormal immunological findings in specimens from other organs, systems and tissues: Secondary | ICD-10-CM | POA: Diagnosis not present

## 2014-09-13 DIAGNOSIS — M2550 Pain in unspecified joint: Secondary | ICD-10-CM | POA: Diagnosis not present

## 2014-09-13 DIAGNOSIS — R5383 Other fatigue: Secondary | ICD-10-CM | POA: Diagnosis not present

## 2014-09-13 DIAGNOSIS — R439 Unspecified disturbances of smell and taste: Secondary | ICD-10-CM | POA: Diagnosis not present

## 2014-09-13 DIAGNOSIS — K117 Disturbances of salivary secretion: Secondary | ICD-10-CM | POA: Diagnosis not present

## 2014-09-13 DIAGNOSIS — R5381 Other malaise: Secondary | ICD-10-CM | POA: Diagnosis not present

## 2014-09-27 DIAGNOSIS — R894 Abnormal immunological findings in specimens from other organs, systems and tissues: Secondary | ICD-10-CM | POA: Diagnosis not present

## 2014-09-27 DIAGNOSIS — K117 Disturbances of salivary secretion: Secondary | ICD-10-CM | POA: Diagnosis not present

## 2014-09-27 DIAGNOSIS — M069 Rheumatoid arthritis, unspecified: Secondary | ICD-10-CM | POA: Diagnosis not present

## 2014-09-27 DIAGNOSIS — M3500 Sicca syndrome, unspecified: Secondary | ICD-10-CM | POA: Diagnosis not present

## 2014-10-30 ENCOUNTER — Ambulatory Visit (INDEPENDENT_AMBULATORY_CARE_PROVIDER_SITE_OTHER): Payer: Medicare Other | Admitting: Cardiology

## 2014-10-30 ENCOUNTER — Encounter: Payer: Self-pay | Admitting: Cardiology

## 2014-10-30 VITALS — BP 120/72 | HR 65 | Ht 68.0 in | Wt 182.8 lb

## 2014-10-30 DIAGNOSIS — E782 Mixed hyperlipidemia: Secondary | ICD-10-CM | POA: Diagnosis not present

## 2014-10-30 DIAGNOSIS — I10 Essential (primary) hypertension: Secondary | ICD-10-CM

## 2014-10-30 DIAGNOSIS — I2510 Atherosclerotic heart disease of native coronary artery without angina pectoris: Secondary | ICD-10-CM

## 2014-10-30 NOTE — Patient Instructions (Signed)

## 2014-10-30 NOTE — Assessment & Plan Note (Signed)
LDL down under 100 with low HDL. He is focusing on dietary measures, also omega-3 supplements. Known statin intolerance.

## 2014-10-30 NOTE — Progress Notes (Signed)
Reason for visit: CAD  Clinical Summary Mr. Schneck is a 78 y.o.male last seen in May. He is doing very well without significant angina symptoms or nitroglycerin use. We reviewed his medications, he continues on aspirin, Norvasc, and beta blocker. Not specifically exercising, but states that he takes care of chores around the house, walks up and back on his 300 foot driveway without difficulty.  Recent lab work showed total cholesterol 164, triglycerides 204, HDL 24, and LDL 99. AST had improved to 55 from 78, and ALT had improved to 37 from 67.he has known statin intolerance.he is on omega-3 supplements, also cinnamon and honey. States that the latter combination has been helping his rheumatoid arthritis as well.  Today we discussed follow-up stress testing for ischemic surveillance with history of DES to the RCA in 2011. He has been symptomatically quite stable, and at this point he wanted to hold off. We decided that we would re-evaluate this at his next visit in 6 months.   Allergies  Allergen Reactions  . Levofloxacin Rash    Unknown REACTION: rash  . Penicillins Rash    REACTION: hives  . Statins Other (See Comments) and Rash    aching aching    Current Outpatient Prescriptions  Medication Sig Dispense Refill  . amLODipine (NORVASC) 5 MG tablet Take 1 tablet (5 mg total) by mouth daily. 90 tablet 3  . aspirin 81 MG tablet Take 81 mg by mouth daily.      . cevimeline (EVOXAC) 30 MG capsule Take 30 mg by mouth 2 (two) times daily. May add 1 tab at bedtime as needed.    . Cholecalciferol (VITAMIN D-3) 5000 UNITS TABS Take 1 tablet by mouth daily.    . clobetasol (TEMOVATE) 0.05 % GEL Apply topically as needed.    . colchicine 0.6 MG tablet Take 0.6 mg by mouth 2 (two) times daily.      Marland Kitchen esomeprazole (NEXIUM) 40 MG capsule Take 40 mg by mouth daily.     Marland Kitchen gabapentin (NEURONTIN) 100 MG capsule Take 3 capsules (300 mg total) by mouth at bedtime. (Patient taking differently: Take  200 mg by mouth at bedtime. ) 270 capsule 3  . metoprolol tartrate (LOPRESSOR) 25 MG tablet Take 1/2 tablet by mouth twice per day. 180 tablet 3  . Multiple Vitamin (MULTIVITAMIN) tablet Take 1 tablet by mouth daily.      . nitroGLYCERIN (NITROSTAT) 0.4 MG SL tablet Place 1 tablet (0.4 mg total) under the tongue every 5 (five) minutes as needed. 25 tablet 3  . NON FORMULARY CPAP Use as directed     . omega-3 acid ethyl esters (LOVAZA) 1 G capsule Take 2 capsules (2 g total) by mouth 2 (two) times daily. 360 capsule 3  . vitamin B-12 (CYANOCOBALAMIN) 1000 MCG tablet Take 1,000 mcg by mouth daily.    Marland Kitchen zinc gluconate 50 MG tablet Take 50 mg by mouth 2 (two) times daily.      No current facility-administered medications for this visit.    Past Medical History  Diagnosis Date  . Coronary atherosclerosis of native coronary artery     DES RCA 5/11, LVEF 55%  . Hiatal hernia   . Pulmonary nodules   . Obstructive sleep apnea   . Prostate cancer   . Essential hypertension, benign   . Mixed hyperlipidemia   . GERD (gastroesophageal reflux disease)     Social History Mr. Warmuth reports that he quit smoking about 22 years ago. His smoking  use included Cigarettes. He has a 40 pack-year smoking history. He has never used smokeless tobacco. Mr. Niu reports that he drinks alcohol.  Review of Systems Complete review of systems negative except as otherwise outlined in the clinical summary and also the following. Arthritic hand pains much better over the last month. No claudication.  Physical Examination Filed Vitals:   10/30/14 1500  BP: 120/72  Pulse: 65   Filed Weights   10/30/14 1500  Weight: 182 lb 12.8 oz (82.918 kg)    Overweight male no acute distress.  HEENT: Conjunctiva and lids normal, oropharynx clear.  Neck: Supple, elevated JVP or bruits.  Lungs: Clear to auscultation, nonlabored.  Cardiac: Regular rate and rhythm, no S3.  Abdomen: Soft, nontender, bowel sounds  present.  Musculoskeletal: No kyphosis.  Extremities: No pitting edema.   Problem List and Plan   CORONARY ATHEROSCLEROSIS NATIVE CORONARY ARTERY Symptomatically stable on medical therapy with history of DES to the RCA in 2011. We did discuss follow-up ischemic testing, decided to hold off for now. We will reevaluate at his next visit.  Essential hypertension, benign Blood pressure looks very good today.  Mixed hyperlipidemia LDL down under 100 with low HDL. He is focusing on dietary measures, also omega-3 supplements. Known statin intolerance.    Satira Sark, M.D., F.A.C.C.

## 2014-10-30 NOTE — Assessment & Plan Note (Signed)
Symptomatically stable on medical therapy with history of DES to the RCA in 2011. We did discuss follow-up ischemic testing, decided to hold off for now. We will reevaluate at his next visit.

## 2014-10-30 NOTE — Assessment & Plan Note (Signed)
Blood pressure looks very good today.

## 2014-11-22 ENCOUNTER — Encounter: Payer: Self-pay | Admitting: Neurology

## 2015-01-01 DIAGNOSIS — E781 Pure hyperglyceridemia: Secondary | ICD-10-CM | POA: Diagnosis not present

## 2015-01-01 DIAGNOSIS — E780 Pure hypercholesterolemia: Secondary | ICD-10-CM | POA: Diagnosis not present

## 2015-01-01 DIAGNOSIS — I10 Essential (primary) hypertension: Secondary | ICD-10-CM | POA: Diagnosis not present

## 2015-01-01 DIAGNOSIS — K210 Gastro-esophageal reflux disease with esophagitis: Secondary | ICD-10-CM | POA: Diagnosis not present

## 2015-01-01 DIAGNOSIS — J3089 Other allergic rhinitis: Secondary | ICD-10-CM | POA: Diagnosis not present

## 2015-01-04 DIAGNOSIS — K210 Gastro-esophageal reflux disease with esophagitis: Secondary | ICD-10-CM | POA: Diagnosis not present

## 2015-01-04 DIAGNOSIS — I10 Essential (primary) hypertension: Secondary | ICD-10-CM | POA: Diagnosis not present

## 2015-01-04 DIAGNOSIS — Z1389 Encounter for screening for other disorder: Secondary | ICD-10-CM | POA: Diagnosis not present

## 2015-01-04 DIAGNOSIS — G4733 Obstructive sleep apnea (adult) (pediatric): Secondary | ICD-10-CM | POA: Diagnosis not present

## 2015-01-04 DIAGNOSIS — E782 Mixed hyperlipidemia: Secondary | ICD-10-CM | POA: Diagnosis not present

## 2015-01-04 DIAGNOSIS — J3089 Other allergic rhinitis: Secondary | ICD-10-CM | POA: Diagnosis not present

## 2015-01-04 DIAGNOSIS — M069 Rheumatoid arthritis, unspecified: Secondary | ICD-10-CM | POA: Diagnosis not present

## 2015-02-26 DIAGNOSIS — J0101 Acute recurrent maxillary sinusitis: Secondary | ICD-10-CM | POA: Diagnosis not present

## 2015-03-12 DIAGNOSIS — L438 Other lichen planus: Secondary | ICD-10-CM | POA: Diagnosis not present

## 2015-03-12 DIAGNOSIS — L501 Idiopathic urticaria: Secondary | ICD-10-CM | POA: Diagnosis not present

## 2015-03-12 DIAGNOSIS — G629 Polyneuropathy, unspecified: Secondary | ICD-10-CM | POA: Diagnosis not present

## 2015-03-12 DIAGNOSIS — L821 Other seborrheic keratosis: Secondary | ICD-10-CM | POA: Diagnosis not present

## 2015-04-01 ENCOUNTER — Other Ambulatory Visit: Payer: Self-pay | Admitting: Cardiology

## 2015-04-17 ENCOUNTER — Ambulatory Visit (INDEPENDENT_AMBULATORY_CARE_PROVIDER_SITE_OTHER): Payer: Medicare Other | Admitting: Cardiology

## 2015-04-17 ENCOUNTER — Encounter: Payer: Self-pay | Admitting: *Deleted

## 2015-04-17 ENCOUNTER — Ambulatory Visit: Payer: Medicare Other | Admitting: Cardiology

## 2015-04-17 ENCOUNTER — Encounter: Payer: Self-pay | Admitting: Cardiology

## 2015-04-17 VITALS — BP 142/84 | HR 56 | Ht 67.0 in | Wt 177.0 lb

## 2015-04-17 DIAGNOSIS — I10 Essential (primary) hypertension: Secondary | ICD-10-CM

## 2015-04-17 DIAGNOSIS — I2510 Atherosclerotic heart disease of native coronary artery without angina pectoris: Secondary | ICD-10-CM

## 2015-04-17 DIAGNOSIS — E782 Mixed hyperlipidemia: Secondary | ICD-10-CM

## 2015-04-17 MED ORDER — ASPIRIN 81 MG PO TABS: 81.0000 mg | ORAL_TABLET | ORAL | Status: DC

## 2015-04-17 NOTE — Progress Notes (Signed)
Cardiology Office Note  Date: 04/17/2015   ID: TASHI BAND, DOB Aug 12, 1936, MRN [id number]  PCP: [name], MD  Primary Cardiologist: Rozann Lesches, MD   Chief Complaint  Patient presents with  . Coronary Artery Disease  . Hyperlipidemia    History of Present Illness: Kevin Mathis is a 79 y.o. male last seen in November 2015. He presents for a routine visit. We reviewed his medications, outlined below. Only complaint is of very easy bruising even on low-dose aspirin. I went over his follow-up lab work, outlined below. He continues to work on [employer] via diet mainly, has had statin intolerance. He is not exercising regularly, we talked about a walking regimen again today. Follow-up ECG is normal.  We have discussed follow-up stress testing for ischemic surveillance with history of DES to the RCA in 2011. He is in agreement to proceed.  Otherwise he states that he has had some indigestion symptoms, reports NYHA class 1-2 dyspnea at baseline. He has had no palpitations, dizziness, or syncope.  Past Medical History  Diagnosis Date  . Coronary atherosclerosis of native coronary artery     DES RCA 5/11, LVEF 55%  . Hiatal hernia   . Pulmonary nodules   . Obstructive sleep apnea   . Prostate cancer   . Essential hypertension, benign   . Mixed hyperlipidemia   . GERD (gastroesophageal reflux disease)     Past Surgical History  Procedure Laterality Date  . Appendectomy    . Hemorrhoid surgery    . Prostatectomy    . Laparoscopic cholecystectomy    . Skin cancer resection      Current Outpatient Prescriptions  Medication Sig Dispense Refill  . amLODipine (NORVASC) 5 MG tablet TAKE 1 TABLET DAILY 90 tablet 2  . aspirin 81 MG tablet Take 81 mg by mouth daily.      . cevimeline (EVOXAC) 30 MG capsule Take 30 mg by mouth 2 (two) times daily. May add 1 tab at bedtime as needed.    . Cholecalciferol (VITAMIN D-3) 5000 UNITS TABS Take 1 tablet by mouth  daily.    . clobetasol (TEMOVATE) 0.05 % GEL Apply topically as needed.    . colchicine 0.6 MG tablet Take 0.6 mg by mouth 2 (two) times daily.      Marland Kitchen esomeprazole (NEXIUM) 40 MG capsule Take 40 mg by mouth daily.     Marland Kitchen gabapentin (NEURONTIN) 100 MG capsule Take 3 capsules (300 mg total) by mouth at bedtime. (Patient taking differently: Take 200 mg by mouth at bedtime. ) 270 capsule 3  . metoprolol tartrate (LOPRESSOR) 25 MG tablet Take 1/2 tablet by mouth twice per day. 180 tablet 3  . Multiple Vitamin (MULTIVITAMIN) tablet Take 1 tablet by mouth daily.      . nitroGLYCERIN (NITROSTAT) 0.4 MG SL tablet Place 1 tablet (0.4 mg total) under the tongue every 5 (five) minutes as needed. 25 tablet 3  . NON FORMULARY CPAP Use as directed     . omega-3 acid ethyl esters (LOVAZA) 1 G capsule Take 2 capsules (2 g total) by mouth 2 (two) times daily. 360 capsule 3  . vitamin B-12 (CYANOCOBALAMIN) 1000 MCG tablet Take 1,000 mcg by mouth daily.    Marland Kitchen zinc gluconate 50 MG tablet Take 50 mg by mouth 2 (two) times daily.      No current facility-administered medications for this visit.    Allergies:  Cefprozil; Levofloxacin; Penicillins; and Statins   Social History: The  patient  reports that he quit smoking about 23 years ago. His smoking use included Cigarettes. He has a 40 pack-year smoking history. He has never used smokeless tobacco. He reports that he drinks alcohol. He reports that he does not use illicit drugs.   ROS:  Please see the history of present illness. Otherwise, complete review of systems is positive for none.  All other systems are reviewed and negative.   Physical Exam: VS:  BP 142/84 mmHg  Pulse 56  Ht 5\' 7"  (1.702 m)  Wt 177 lb (80.287 kg)  BMI 27.72 kg/m2  SpO2 97%, BMI Body mass index is 27.72 kg/(m^2).  Wt Readings from Last 3 Encounters:  04/17/15 177 lb (80.287 kg)  10/30/14 182 lb 12.8 oz (82.918 kg)  06/28/14 182 lb (82.555 kg)    Overweight male no acute distress.   HEENT: Conjunctiva and lids normal, oropharynx clear.  Neck: Supple, elevated JVP or bruits.  Lungs: Clear to auscultation, nonlabored.  Cardiac: Regular rate and rhythm, no S3.  Abdomen: Soft, nontender, bowel sounds present.  Musculoskeletal: No kyphosis.  Skin: Warm and dry. Scattered ecchymoses noted. Extremities: No pitting edema. Neuropsychiatric: Alert and oriented 3, affect appropriate.   ECG: ECG is ordered today and reviewed showing sinus bradycardia.  Recent Labwork:  09/01/2014: BUN 13, currently 1.0, potassium 4.6, AST 55, ALT 37, cholesterol 164, triglycerides 204, HDL 24, LDL 99 01/01/2015: BUN 11, creatinine 1.1, potassium 4.6, AST 70, ALT 41, cholesterol 183, triglycerides 124, HDL 40, LDL 118.  Assessment and Plan:  1. CAD status post DES to the RCA in 2011. Continue present regimen, reduce aspirin to every other day dosing to see if this helps with his bruising. We will also proceed with an exercise Cardiolite (hold Lopressor) to ressess ischemic burden.  2. Central hypertension, blood pressure mildly elevated today. No changes made to current regimen. Recommend walking regimen.  3. Hyperlipidemia with statin intolerance. He takes honey, cinnamon, and omega-3 supplements. LDL 118.  Current medicines were reviewed with the patient today.   Orders Placed This Encounter  Procedures  . NM Myocar Multi W/Spect W/Wall Motion / EF  . Myocardial Perfusion Imaging  . EKG 12-Lead    Disposition: FU with me in 6 months.   Signed, Satira Sark, MD, [facility] 04/17/2015 8:33 AM    [facility], [place], [place] [address] Phone: [phone]; Fax: [phone]

## 2015-04-17 NOTE — Patient Instructions (Addendum)
   Medication instructions Your physician has recommended you make the following change in your medication:  Decrease aspirin to every other day. Your physician has requested that you have en exercise stress myoview. For further information please visit [email or web address]. Please follow instruction sheet, as given. Your physician recommends that you schedule a follow-up appointment in: 6 months. You will receive a reminder letter in the mail in about 4 months reminding you to call and schedule your appointment. If you don't receive this letter, please contact our office.

## 2015-04-19 DIAGNOSIS — J320 Chronic maxillary sinusitis: Secondary | ICD-10-CM | POA: Diagnosis not present

## 2015-04-24 ENCOUNTER — Ambulatory Visit (HOSPITAL_COMMUNITY)
Admission: RE | Admit: 2015-04-24 | Discharge: 2015-04-24 | Disposition: A | Discharge status: Home / Self Care | Payer: Medicare Other | Source: Ambulatory Visit | Attending: Cardiology | Admitting: Cardiology

## 2015-04-24 ENCOUNTER — Encounter (HOSPITAL_COMMUNITY)
Admission: RE | Admit: 2015-04-24 | Discharge: 2015-04-24 | Disposition: A | Discharge status: Home / Self Care | Payer: Medicare Other | Source: Ambulatory Visit | Attending: Cardiology | Admitting: Cardiology

## 2015-04-24 ENCOUNTER — Encounter (HOSPITAL_COMMUNITY): Payer: Self-pay

## 2015-04-24 DIAGNOSIS — I2510 Atherosclerotic heart disease of native coronary artery without angina pectoris: Secondary | ICD-10-CM | POA: Insufficient documentation

## 2015-04-24 MED ORDER — TECHNETIUM TC 99M SESTAMIBI - CARDIOLITE
30.0000 | Freq: Once | INTRAVENOUS | Status: AC | PRN
  Administered 2015-04-24: 30 via INTRAVENOUS

## 2015-04-24 MED ORDER — REGADENOSON 0.4 MG/5ML IV SOLN
INTRAVENOUS | Status: AC
  Filled 2015-04-24: qty 5

## 2015-04-24 MED ORDER — SODIUM CHLORIDE 0.9 % IJ SOLN
INTRAMUSCULAR | Status: AC
  Administered 2015-04-24: 10 mL via INTRAVENOUS
  Filled 2015-04-24: qty 3

## 2015-04-24 MED ORDER — SODIUM CHLORIDE 0.9 % IJ SOLN
10.0000 mL | INTRAMUSCULAR | Status: DC | PRN
  Administered 2015-04-24: 10 mL via INTRAVENOUS
  Filled 2015-04-24: qty 10

## 2015-04-24 MED ORDER — TECHNETIUM TC 99M SESTAMIBI GENERIC - CARDIOLITE
10.0000 | Freq: Once | INTRAVENOUS | Status: AC | PRN
  Administered 2015-04-24: 10 via INTRAVENOUS

## 2015-04-24 NOTE — Progress Notes (Signed)
Stress Lab Nurses Notes - [place] 04/24/2015 Reason for doing test: CAD Type of test: Stress Cardiolite Nurse performing test: Gerrit Halls, RN Nuclear Medicine Tech: Melburn Hake Echo Tech: Not Applicable MD performing test: Branch/K.Purcell Nails NP Family MD: Quintin Alto Test explained and consent signed: Yes.   IV started: Saline lock flushed, No redness or edema and Saline lock started in radiology Symptoms: SOB Treatment/Intervention: None Reason test stopped: fatigue and reached target HR After recovery IV was: Discontinued via X-ray tech and No redness or edema Patient to return to Nuc. Med at :10:45 Patient discharged: Home Patient's Condition upon discharge was: stable Comments: During test peak BP 164/69 & HR 133.  Recovery BP 159/92 & HR 83.  Symptoms resolved in recovery. Geanie Cooley T

## 2015-04-25 ENCOUNTER — Telehealth: Payer: Self-pay | Admitting: *Deleted

## 2015-04-25 NOTE — Telephone Encounter (Signed)
Patient informed. 

## 2015-04-25 NOTE — Telephone Encounter (Signed)
-----   Message from Satira Sark, MD sent at 04/24/2015  2:08 PM EDT ----- Reviewed report. Overall low risk study. No clear evidence of active ischemia and overall normal LVEF. We will continue medical therapy.

## 2015-05-08 DIAGNOSIS — J320 Chronic maxillary sinusitis: Secondary | ICD-10-CM | POA: Diagnosis not present

## 2015-05-08 DIAGNOSIS — H903 Sensorineural hearing loss, bilateral: Secondary | ICD-10-CM | POA: Diagnosis not present

## 2015-05-15 DIAGNOSIS — G4733 Obstructive sleep apnea (adult) (pediatric): Secondary | ICD-10-CM | POA: Diagnosis not present

## 2015-05-17 ENCOUNTER — Other Ambulatory Visit: Payer: Self-pay | Admitting: Neurology

## 2015-05-17 ENCOUNTER — Other Ambulatory Visit: Payer: Self-pay | Admitting: Cardiology

## 2015-05-18 DIAGNOSIS — M47816 Spondylosis without myelopathy or radiculopathy, lumbar region: Secondary | ICD-10-CM | POA: Diagnosis not present

## 2015-05-18 DIAGNOSIS — M5441 Lumbago with sciatica, right side: Secondary | ICD-10-CM | POA: Diagnosis not present

## 2015-05-18 DIAGNOSIS — M9903 Segmental and somatic dysfunction of lumbar region: Secondary | ICD-10-CM | POA: Diagnosis not present

## 2015-05-25 DIAGNOSIS — I10 Essential (primary) hypertension: Secondary | ICD-10-CM | POA: Diagnosis not present

## 2015-05-25 DIAGNOSIS — E782 Mixed hyperlipidemia: Secondary | ICD-10-CM | POA: Diagnosis not present

## 2015-05-25 DIAGNOSIS — E780 Pure hypercholesterolemia: Secondary | ICD-10-CM | POA: Diagnosis not present

## 2015-05-25 DIAGNOSIS — N320 Bladder-neck obstruction: Secondary | ICD-10-CM | POA: Diagnosis not present

## 2015-05-25 DIAGNOSIS — E781 Pure hyperglyceridemia: Secondary | ICD-10-CM | POA: Diagnosis not present

## 2015-05-29 DIAGNOSIS — M47816 Spondylosis without myelopathy or radiculopathy, lumbar region: Secondary | ICD-10-CM | POA: Diagnosis not present

## 2015-05-29 DIAGNOSIS — M5441 Lumbago with sciatica, right side: Secondary | ICD-10-CM | POA: Diagnosis not present

## 2015-05-29 DIAGNOSIS — M9903 Segmental and somatic dysfunction of lumbar region: Secondary | ICD-10-CM | POA: Diagnosis not present

## 2015-05-31 DIAGNOSIS — K210 Gastro-esophageal reflux disease with esophagitis: Secondary | ICD-10-CM | POA: Diagnosis not present

## 2015-05-31 DIAGNOSIS — M069 Rheumatoid arthritis, unspecified: Secondary | ICD-10-CM | POA: Diagnosis not present

## 2015-05-31 DIAGNOSIS — J3089 Other allergic rhinitis: Secondary | ICD-10-CM | POA: Diagnosis not present

## 2015-05-31 DIAGNOSIS — G4733 Obstructive sleep apnea (adult) (pediatric): Secondary | ICD-10-CM | POA: Diagnosis not present

## 2015-05-31 DIAGNOSIS — E782 Mixed hyperlipidemia: Secondary | ICD-10-CM | POA: Diagnosis not present

## 2015-05-31 DIAGNOSIS — R7301 Impaired fasting glucose: Secondary | ICD-10-CM | POA: Diagnosis not present

## 2015-05-31 DIAGNOSIS — M109 Gout, unspecified: Secondary | ICD-10-CM | POA: Diagnosis not present

## 2015-05-31 DIAGNOSIS — I10 Essential (primary) hypertension: Secondary | ICD-10-CM | POA: Diagnosis not present

## 2015-06-01 DIAGNOSIS — M47816 Spondylosis without myelopathy or radiculopathy, lumbar region: Secondary | ICD-10-CM | POA: Diagnosis not present

## 2015-06-01 DIAGNOSIS — M5441 Lumbago with sciatica, right side: Secondary | ICD-10-CM | POA: Diagnosis not present

## 2015-06-01 DIAGNOSIS — I10 Essential (primary) hypertension: Secondary | ICD-10-CM | POA: Diagnosis not present

## 2015-06-01 DIAGNOSIS — M9903 Segmental and somatic dysfunction of lumbar region: Secondary | ICD-10-CM | POA: Diagnosis not present

## 2015-06-06 DIAGNOSIS — M5441 Lumbago with sciatica, right side: Secondary | ICD-10-CM | POA: Diagnosis not present

## 2015-06-06 DIAGNOSIS — M9903 Segmental and somatic dysfunction of lumbar region: Secondary | ICD-10-CM | POA: Diagnosis not present

## 2015-06-06 DIAGNOSIS — M47816 Spondylosis without myelopathy or radiculopathy, lumbar region: Secondary | ICD-10-CM | POA: Diagnosis not present

## 2015-06-13 DIAGNOSIS — M5441 Lumbago with sciatica, right side: Secondary | ICD-10-CM | POA: Diagnosis not present

## 2015-06-13 DIAGNOSIS — M9903 Segmental and somatic dysfunction of lumbar region: Secondary | ICD-10-CM | POA: Diagnosis not present

## 2015-06-13 DIAGNOSIS — M47816 Spondylosis without myelopathy or radiculopathy, lumbar region: Secondary | ICD-10-CM | POA: Diagnosis not present

## 2015-06-26 DIAGNOSIS — M5441 Lumbago with sciatica, right side: Secondary | ICD-10-CM | POA: Diagnosis not present

## 2015-06-26 DIAGNOSIS — M545 Low back pain: Secondary | ICD-10-CM | POA: Diagnosis not present

## 2015-06-26 DIAGNOSIS — M9903 Segmental and somatic dysfunction of lumbar region: Secondary | ICD-10-CM | POA: Diagnosis not present

## 2015-06-26 DIAGNOSIS — M47816 Spondylosis without myelopathy or radiculopathy, lumbar region: Secondary | ICD-10-CM | POA: Diagnosis not present

## 2015-06-29 ENCOUNTER — Ambulatory Visit: Admitting: Nurse Practitioner

## 2015-06-29 ENCOUNTER — Encounter: Payer: Self-pay | Admitting: Nurse Practitioner

## 2015-06-29 ENCOUNTER — Ambulatory Visit (INDEPENDENT_AMBULATORY_CARE_PROVIDER_SITE_OTHER): Payer: Medicare Other | Admitting: Nurse Practitioner

## 2015-06-29 VITALS — BP 120/77 | HR 74 | Ht 67.0 in | Wt 180.0 lb

## 2015-06-29 DIAGNOSIS — I2510 Atherosclerotic heart disease of native coronary artery without angina pectoris: Secondary | ICD-10-CM

## 2015-06-29 DIAGNOSIS — G629 Polyneuropathy, unspecified: Secondary | ICD-10-CM | POA: Diagnosis not present

## 2015-06-29 MED ORDER — GABAPENTIN 100 MG PO CAPS: ORAL_CAPSULE | ORAL | Status: DC

## 2015-06-29 NOTE — Progress Notes (Signed)
[facility]  PATIENT: Kevin Mathis DOB: [birth year]   REASON FOR VISIT: Follow-up for bilateral feet paresthesias , small fiber neuropathy HISTORY FROM: Patient    HISTORY OF PRESENT ILLNESS: HISTORY: 06/28/14 Kevin Mathis is a 79 years old right-handed Caucasian male, accompanied by his wife, I saw him previously March 2014, for bilateral feet paresthesia, his primary care physician is Dr. Salome Spotted He had a past medical history of coronary artery disease, hypertension, connective tissue disease of unknown type, he also has chronic whole-body itching, has been on long-term colchicine, 0.6 mg twice a day  Previous laboratory evaluation showed a positive ANA, SSA, but the rest of the laboratory evaluation including TSH, B12, CMP, p-ANCA, CMP, protein electrophoresis, Lyme titer, ACE was normal Previous electrodiagnostic study also showed no evidence of large fiber peripheral neuropathy. This has led to a skin biopsy, which showed patterns consistent with small fiber neuropathy, with decreased epidural nerve fibers at his feet, and distal leg He has been taking gabapentin 100 mg one tablet every night as needed along with Tylenol PM 2 tablets every night for his sleep, and bilateral feet paresthesia, his bilateral feet paresthesia overall has been fairly stable, he complains of numbness tingling burning cold sensation at his distal toes, plantar surface, difficulty sleeping sometimes But over the past 2 months, he seems to have increased night time bilateral feet discomfort, especially after being active during the daytime, his feet was so bothersome, sometime has difficulty sleeping, extra dose of gabapentin was helpful, but he has such sound sleep, sometimes to the point of wetting his bed,  He denies gait difficulty, denies significant low back pain, no bilateral upper extremity paresthesia UPDATE 06/29/15 Kevin Mathis, 79 year old male returns for follow-up. He has a history  of bilateral paresthesias of the feet and length dependent small fiber peripheral neuropathy etiology unclear but could be due to his long-term use of colchicine for gout. His symptoms are fairly well controlled with gabapentin. In addition he claims he has been to the chiropractor several times for manipulation and feels that his symptoms are better because of  TENS and laser therapy to feet. He returns for reevaluation  REVIEW OF SYSTEMS: Full 14 system review of systems performed and notable only for those listed, all others are neg:  Constitutional: neg  Cardiovascular: neg Ear/Nose/Throat: neg  Skin: neg Eyes: neg Respiratory: neg Gastroitestinal: neg  Hematology/Lymphatic: Easy bruising Endocrine: neg Musculoskeletal: Joint pain and swelling Allergy/Immunology: neg Neurological: neg Psychiatric: neg Sleep : Restless legs   ALLERGIES: Allergies  Allergen Reactions  . Cefprozil   . Levofloxacin Rash    Unknown REACTION: rash  . Penicillins Rash    REACTION: hives  . Statins Other (See Comments) and Rash    aching aching    HOME MEDICATIONS: Outpatient Prescriptions Prior to Visit  Medication Sig Dispense Refill  . amLODipine (NORVASC) 5 MG tablet TAKE 1 TABLET DAILY 90 tablet 2  . aspirin 81 MG tablet Take 1 tablet (81 mg total) by mouth every other day. 30 tablet 3  . cevimeline (EVOXAC) 30 MG capsule Take 30 mg by mouth 2 (two) times daily. May add 1 tab at bedtime as needed.    . Cholecalciferol (VITAMIN D-3) 5000 UNITS TABS Take 1 tablet by mouth daily.    . clobetasol (TEMOVATE) 0.05 % GEL Apply topically as needed.    . colchicine 0.6 MG tablet Take 0.6 mg by mouth 2 (two) times daily.      Marland Kitchen  esomeprazole (NEXIUM) 40 MG capsule Take 40 mg by mouth daily.     Marland Kitchen gabapentin (NEURONTIN) 100 MG capsule TAKE 3 CAPSULES AT BEDTIME (Patient taking differently: TAKE 2-3 CAPSULES AT BEDTIME) 270 capsule 0  . metoprolol tartrate (LOPRESSOR) 25 MG tablet Take 1/2 tablet by  mouth twice per day. 180 tablet 3  . Multiple Vitamin (MULTIVITAMIN) tablet Take 1 tablet by mouth daily.      . nitroGLYCERIN (NITROSTAT) 0.4 MG SL tablet Place 1 tablet (0.4 mg total) under the tongue every 5 (five) minutes as needed. 25 tablet 3  . NON FORMULARY CPAP Use as directed     . omega-3 acid ethyl esters (LOVAZA) 1 G capsule TAKE 2 CAPSULES TWICE A DAY 360 capsule 2  . vitamin B-12 (CYANOCOBALAMIN) 1000 MCG tablet Take 1,000 mcg by mouth daily.    Marland Kitchen zinc gluconate 50 MG tablet Take 50 mg by mouth 2 (two) times daily.      No facility-administered medications prior to visit.    PAST MEDICAL HISTORY: Past Medical History  Diagnosis Date  . Coronary atherosclerosis of native coronary artery     DES RCA 5/11, LVEF 55%  . Hiatal hernia   . Pulmonary nodules   . Obstructive sleep apnea   . Prostate cancer   . Essential hypertension, benign   . Mixed hyperlipidemia   . GERD (gastroesophageal reflux disease)   . Rheumatoid arthritis     PAST SURGICAL HISTORY: Past Surgical History  Procedure Laterality Date  . Appendectomy    . Hemorrhoid surgery    . Prostatectomy    . Laparoscopic cholecystectomy    . Skin cancer resection      FAMILY HISTORY: Family History  Problem Relation Age of Onset  . Hypertension      SOCIAL HISTORY: History   Social History  . Marital Status: Married    Spouse Name: N/A  . Number of Children: N/A  . Years of Education: N/A   Occupational History  . Not on file.   Social History Main Topics  . Smoking status: Former Smoker -- 1.00 packs/day for 40 years    Types: Cigarettes    Quit date: 12/30/1991  . Smokeless tobacco: Never Used  . Alcohol Use: 0.0 oz/week    0 Standard drinks or equivalent per week  . Drug Use: No  . Sexual Activity: Not on file   Other Topics Concern  . Not on file   Social History Narrative     PHYSICAL EXAM  Filed Vitals:   06/29/15 0928  BP: 120/77  Pulse: 74  Height: 5\' 7"  (1.702 m)    Weight: 180 lb (81.647 kg)   Body mass index is 28.19 kg/(m^2). Generalized: In no acute distress Neck: Supple, no carotid bruits  Musculoskeletal: No deformity  Neurological examination  Mentation: Alert oriented to time, place, history taking, and causual conversation Cranial nerve II-XII: Pupils were equal round reactive to light. Extraocular movements were full. Visual field were full on confrontational test. Bilateral fundi were sharp. Facial sensation and strength were normal. Hearing was intact to finger rubbing bilaterally. Uvula tongue midline. Head turning and shoulder shrug and were normal and symmetric.Tongue protrusion into cheek strength was normal. Motor: Normal tone, bulk and strength. Sensory: mildly length dependent decreased fine touch, pinprick to distal leg, preserved vibratory sensation, and proprioception at toes. Coordination: Normal finger to nose, heel-to-shin bilaterally  Gait: Rising up from seated position without assistance, normal stance, without trunk ataxia, moderate stride, good  arm swing, smooth turning, able to perform tiptoe, and heel walking without difficulty.  Deep tendon reflexes: Brachioradialis 2/2, biceps 2/2, triceps 2/2, patellar 2/2, Achilles 2/2, plantar responses were flexor bilaterally.   DIAGNOSTIC DATA (LABS, IMAGING, TESTING) - ASSESSMENT AND PLAN  79 y.o. year old male  has a past medical history of paresthesias of the feet andlength dependent small fiber peripheral neuropathy of unknown etiology but may be long-term use of colchicine.   Take gabapentin 300mg  at night will refill for one year F/U yearly Dennie Bible, [facility], [facility], [facility] [address] [place], [place] [address] [phone]

## 2015-06-29 NOTE — Patient Instructions (Signed)
Take gabapentin 300mg  at night will refill F/U yearly

## 2015-06-30 ENCOUNTER — Other Ambulatory Visit: Payer: Self-pay | Admitting: Cardiology

## 2015-07-01 DIAGNOSIS — I10 Essential (primary) hypertension: Secondary | ICD-10-CM | POA: Diagnosis not present

## 2015-07-06 DIAGNOSIS — G4733 Obstructive sleep apnea (adult) (pediatric): Secondary | ICD-10-CM | POA: Diagnosis not present

## 2015-07-06 DIAGNOSIS — J320 Chronic maxillary sinusitis: Secondary | ICD-10-CM | POA: Diagnosis not present

## 2015-07-10 DIAGNOSIS — M47816 Spondylosis without myelopathy or radiculopathy, lumbar region: Secondary | ICD-10-CM | POA: Diagnosis not present

## 2015-07-10 DIAGNOSIS — M545 Low back pain: Secondary | ICD-10-CM | POA: Diagnosis not present

## 2015-07-10 DIAGNOSIS — M5441 Lumbago with sciatica, right side: Secondary | ICD-10-CM | POA: Diagnosis not present

## 2015-07-10 DIAGNOSIS — M9903 Segmental and somatic dysfunction of lumbar region: Secondary | ICD-10-CM | POA: Diagnosis not present

## 2015-07-11 DIAGNOSIS — C61 Malignant neoplasm of prostate: Secondary | ICD-10-CM | POA: Diagnosis not present

## 2015-07-24 DIAGNOSIS — M47816 Spondylosis without myelopathy or radiculopathy, lumbar region: Secondary | ICD-10-CM | POA: Diagnosis not present

## 2015-07-24 DIAGNOSIS — M9903 Segmental and somatic dysfunction of lumbar region: Secondary | ICD-10-CM | POA: Diagnosis not present

## 2015-07-24 DIAGNOSIS — M545 Low back pain: Secondary | ICD-10-CM | POA: Diagnosis not present

## 2015-07-24 DIAGNOSIS — M5441 Lumbago with sciatica, right side: Secondary | ICD-10-CM | POA: Diagnosis not present

## 2015-08-01 DIAGNOSIS — I10 Essential (primary) hypertension: Secondary | ICD-10-CM | POA: Diagnosis not present

## 2015-08-07 DIAGNOSIS — M9903 Segmental and somatic dysfunction of lumbar region: Secondary | ICD-10-CM | POA: Diagnosis not present

## 2015-08-07 DIAGNOSIS — M545 Low back pain: Secondary | ICD-10-CM | POA: Diagnosis not present

## 2015-08-07 DIAGNOSIS — M5441 Lumbago with sciatica, right side: Secondary | ICD-10-CM | POA: Diagnosis not present

## 2015-08-07 DIAGNOSIS — M47816 Spondylosis without myelopathy or radiculopathy, lumbar region: Secondary | ICD-10-CM | POA: Diagnosis not present

## 2015-08-24 DIAGNOSIS — H00029 Hordeolum internum unspecified eye, unspecified eyelid: Secondary | ICD-10-CM | POA: Diagnosis not present

## 2015-08-28 DIAGNOSIS — M5441 Lumbago with sciatica, right side: Secondary | ICD-10-CM | POA: Diagnosis not present

## 2015-08-28 DIAGNOSIS — M47816 Spondylosis without myelopathy or radiculopathy, lumbar region: Secondary | ICD-10-CM | POA: Diagnosis not present

## 2015-08-28 DIAGNOSIS — M9903 Segmental and somatic dysfunction of lumbar region: Secondary | ICD-10-CM | POA: Diagnosis not present

## 2015-08-28 DIAGNOSIS — M545 Low back pain: Secondary | ICD-10-CM | POA: Diagnosis not present

## 2015-09-01 DIAGNOSIS — I10 Essential (primary) hypertension: Secondary | ICD-10-CM | POA: Diagnosis not present

## 2015-09-11 DIAGNOSIS — J320 Chronic maxillary sinusitis: Secondary | ICD-10-CM | POA: Diagnosis not present

## 2015-09-13 DIAGNOSIS — L438 Other lichen planus: Secondary | ICD-10-CM | POA: Diagnosis not present

## 2015-09-13 DIAGNOSIS — Z5181 Encounter for therapeutic drug level monitoring: Secondary | ICD-10-CM | POA: Diagnosis not present

## 2015-09-18 DIAGNOSIS — M47816 Spondylosis without myelopathy or radiculopathy, lumbar region: Secondary | ICD-10-CM | POA: Diagnosis not present

## 2015-09-18 DIAGNOSIS — M545 Low back pain: Secondary | ICD-10-CM | POA: Diagnosis not present

## 2015-09-18 DIAGNOSIS — M5441 Lumbago with sciatica, right side: Secondary | ICD-10-CM | POA: Diagnosis not present

## 2015-09-18 DIAGNOSIS — M9903 Segmental and somatic dysfunction of lumbar region: Secondary | ICD-10-CM | POA: Diagnosis not present

## 2015-09-25 DIAGNOSIS — N400 Enlarged prostate without lower urinary tract symptoms: Secondary | ICD-10-CM | POA: Diagnosis not present

## 2015-09-25 DIAGNOSIS — R7301 Impaired fasting glucose: Secondary | ICD-10-CM | POA: Diagnosis not present

## 2015-09-25 DIAGNOSIS — K210 Gastro-esophageal reflux disease with esophagitis: Secondary | ICD-10-CM | POA: Diagnosis not present

## 2015-09-25 DIAGNOSIS — E782 Mixed hyperlipidemia: Secondary | ICD-10-CM | POA: Diagnosis not present

## 2015-09-25 DIAGNOSIS — M069 Rheumatoid arthritis, unspecified: Secondary | ICD-10-CM | POA: Diagnosis not present

## 2015-09-25 DIAGNOSIS — I10 Essential (primary) hypertension: Secondary | ICD-10-CM | POA: Diagnosis not present

## 2015-10-01 DIAGNOSIS — G4733 Obstructive sleep apnea (adult) (pediatric): Secondary | ICD-10-CM | POA: Diagnosis not present

## 2015-10-01 DIAGNOSIS — I10 Essential (primary) hypertension: Secondary | ICD-10-CM | POA: Diagnosis not present

## 2015-10-01 DIAGNOSIS — R7301 Impaired fasting glucose: Secondary | ICD-10-CM | POA: Diagnosis not present

## 2015-10-01 DIAGNOSIS — I2510 Atherosclerotic heart disease of native coronary artery without angina pectoris: Secondary | ICD-10-CM | POA: Diagnosis not present

## 2015-10-01 DIAGNOSIS — E782 Mixed hyperlipidemia: Secondary | ICD-10-CM | POA: Diagnosis not present

## 2015-10-01 DIAGNOSIS — Z23 Encounter for immunization: Secondary | ICD-10-CM | POA: Diagnosis not present

## 2015-10-01 DIAGNOSIS — M069 Rheumatoid arthritis, unspecified: Secondary | ICD-10-CM | POA: Diagnosis not present

## 2015-10-01 DIAGNOSIS — D692 Other nonthrombocytopenic purpura: Secondary | ICD-10-CM | POA: Diagnosis not present

## 2015-10-01 DIAGNOSIS — M109 Gout, unspecified: Secondary | ICD-10-CM | POA: Diagnosis not present

## 2015-10-01 DIAGNOSIS — G619 Inflammatory polyneuropathy, unspecified: Secondary | ICD-10-CM | POA: Diagnosis not present

## 2015-10-01 DIAGNOSIS — J3089 Other allergic rhinitis: Secondary | ICD-10-CM | POA: Diagnosis not present

## 2015-10-01 DIAGNOSIS — K210 Gastro-esophageal reflux disease with esophagitis: Secondary | ICD-10-CM | POA: Diagnosis not present

## 2015-10-09 DIAGNOSIS — M5441 Lumbago with sciatica, right side: Secondary | ICD-10-CM | POA: Diagnosis not present

## 2015-10-09 DIAGNOSIS — M47816 Spondylosis without myelopathy or radiculopathy, lumbar region: Secondary | ICD-10-CM | POA: Diagnosis not present

## 2015-10-09 DIAGNOSIS — M9903 Segmental and somatic dysfunction of lumbar region: Secondary | ICD-10-CM | POA: Diagnosis not present

## 2015-10-09 DIAGNOSIS — M545 Low back pain: Secondary | ICD-10-CM | POA: Diagnosis not present

## 2015-10-15 ENCOUNTER — Encounter: Payer: Self-pay | Admitting: Cardiology

## 2015-10-15 ENCOUNTER — Ambulatory Visit (INDEPENDENT_AMBULATORY_CARE_PROVIDER_SITE_OTHER): Payer: Medicare Other | Admitting: Cardiology

## 2015-10-15 VITALS — BP 138/82 | HR 60 | Ht 68.0 in | Wt 177.0 lb

## 2015-10-15 DIAGNOSIS — I10 Essential (primary) hypertension: Secondary | ICD-10-CM

## 2015-10-15 DIAGNOSIS — E782 Mixed hyperlipidemia: Secondary | ICD-10-CM | POA: Diagnosis not present

## 2015-10-15 DIAGNOSIS — I2510 Atherosclerotic heart disease of native coronary artery without angina pectoris: Secondary | ICD-10-CM

## 2015-10-15 DIAGNOSIS — G4733 Obstructive sleep apnea (adult) (pediatric): Secondary | ICD-10-CM | POA: Diagnosis not present

## 2015-10-15 MED ORDER — AMLODIPINE BESYLATE 5 MG PO TABS: 5.0000 mg | ORAL_TABLET | Freq: Every day | ORAL | Status: DC

## 2015-10-15 NOTE — Progress Notes (Signed)
Cardiology Office Note  Date: 10/15/2015   ID: Kevin Mathis, DOB 1936/01/23, MRN [id number]  PCP: [name], MD  Primary Cardiologist: Rozann Lesches, MD   Chief Complaint  Patient presents with  . Coronary Artery Disease    History of Present Illness: Kevin Mathis is a 79 y.o. male last seen in April. He presents for a routine follow-up visit. Reports no angina symptoms on medical therapy. He and his wife plan to go on a cruise to the [place] in November. Stays reasonably active, continues to exercise.  Follow-up Cardiolite from April of this year is outlined below, overall low risk. We reviewed the results again today.  We discussed his medications which are outlined below. Reports easy bruising on low-dose aspirin, otherwise no major bleeding problems. He had follow-up lab work recently which is reviewed below.  Primary care continues at [facility]. He reports no other major health changes.   Past Medical History  Diagnosis Date  . Coronary atherosclerosis of native coronary artery     DES RCA 5/11, LVEF 55%  . Hiatal hernia   . Pulmonary nodules   . Obstructive sleep apnea   . Prostate cancer ([place])   . Essential hypertension, benign   . Mixed hyperlipidemia   . GERD (gastroesophageal reflux disease)   . Rheumatoid arthritis [facility])     Past Surgical History  Procedure Laterality Date  . Appendectomy    . Hemorrhoid surgery    . Prostatectomy    . Laparoscopic cholecystectomy    . Skin cancer resection      Current Outpatient Prescriptions  Medication Sig Dispense Refill  . amLODipine (NORVASC) 5 MG tablet TAKE 1 TABLET DAILY 90 tablet 2  . aspirin 81 MG tablet Take 1 tablet (81 mg total) by mouth every other day. 30 tablet 3  . cevimeline (EVOXAC) 30 MG capsule Take 30 mg by mouth 2 (two) times daily. May add 1 tab at bedtime as needed.    . Cholecalciferol (VITAMIN D-3) 5000 UNITS TABS Take 1 tablet by mouth daily.    . clobetasol (TEMOVATE)  0.05 % GEL Apply topically as needed.    . colchicine 0.6 MG tablet Take 0.6 mg by mouth 2 (two) times daily.      Marland Kitchen esomeprazole (NEXIUM) 40 MG capsule Take 40 mg by mouth daily.     Marland Kitchen gabapentin (NEURONTIN) 100 MG capsule TAKE 3 CAPSULES AT BEDTIME 270 capsule 3  . metoprolol tartrate (LOPRESSOR) 25 MG tablet TAKE ONE-HALF (1/2) TABLET TWICE A DAY 180 tablet 2  . Multiple Vitamin (MULTIVITAMIN) tablet Take 1 tablet by mouth daily.      . nitroGLYCERIN (NITROSTAT) 0.4 MG SL tablet Place 1 tablet (0.4 mg total) under the tongue every 5 (five) minutes as needed. 25 tablet 3  . NON FORMULARY CPAP Use as directed     . omega-3 acid ethyl esters (LOVAZA) 1 G capsule TAKE 2 CAPSULES TWICE A DAY 360 capsule 2  . vitamin B-12 (CYANOCOBALAMIN) 1000 MCG tablet Take 1,000 mcg by mouth daily.    Marland Kitchen zinc gluconate 50 MG tablet Take 50 mg by mouth 2 (two) times daily.      No current facility-administered medications for this visit.    Allergies:  Cefprozil; Levofloxacin; Penicillins; and Statins   Social History: The patient  reports that he quit smoking about 23 years ago. His smoking use included Cigarettes. He has a 40 pack-year smoking history. He has never used smokeless tobacco. He reports  that he drinks alcohol. He reports that he does not use illicit drugs.   ROS:  Please see the history of present illness. Otherwise, complete review of systems is positive for none.  All other systems are reviewed and negative.   Physical Exam: VS:  BP 138/82 mmHg  Pulse 60  Ht 5\' 8"  (1.727 m)  Wt 177 lb (80.287 kg)  BMI 26.92 kg/m2  SpO2 98%, BMI Body mass index is 26.92 kg/(m^2).  Wt Readings from Last 3 Encounters:  10/15/15 177 lb (80.287 kg)  06/29/15 180 lb (81.647 kg)  04/17/15 177 lb (80.287 kg)     Overweight male no acute distress.  HEENT: Conjunctiva and lids normal, oropharynx clear.  Neck: Supple, elevated JVP or bruits.  Lungs: Clear to auscultation, nonlabored.  Cardiac: Regular  rate and rhythm, no S3.  Abdomen: Soft, nontender, bowel sounds present.  Musculoskeletal: No kyphosis.  Skin: Warm and dry. Few ecchymoses noted. Extremities: No pitting edema. Neuropsychiatric: Alert and oriented 3, affect appropriate.   ECG: ECG is not ordered today.   Recent Labwork:  January 2016: BUN 11, creatinine 1.0, potassium 4.6 , AST 70, ALT 41 , cholesterol 183, triglycerides 124, HDL 40, LDL 118 September 2016: Cholesterol 194, triglycerides 98, HDL 44, LDL 130, hemoglobin 14.2, platelets 151, BUN 13, creatinine 0.9, potassium 5.4, AST 31, ALT 39, hemoglobin A1c 5.9  Other Studies Reviewed Today:  Exercise Cardiolite 04/24/2015:  FINDINGS: Exercise stress  Baseline EKG shows normal sinus rhythm. The patient was stressed according to the Bruce protocol for 7 minutes and 1 second achieving a work level of 8.30 Mets. The resting heart rate is 60 beats per minute rose to a maximum of 134 beats per minute, representing 93% of the maximal age predicted heart rate. The resting blood pressure of 148/87 increased to a maximum of 189/93. The test was stopped due to fatigue, the patient did not experience any chest pain. Stress EKG showed no specific ischemic changes and no significant arrhythmias.  Perfusion: There is a medium sized moderate intensity fixed inferior wall defect. The inferior wall has normal wall motion. Findings are most consistent with sub- diaphragmatic attenuation, however cannot rule out prior inferior infarct.  Wall Motion: Normal left ventricular wall motion. No left ventricular dilation.  Left Ventricular Ejection Fraction: 76 %  End diastolic volume 64 ml  End systolic volume 15 ml  IMPRESSION: 1. Medium size moderate intensity inferior wall defect. Normal wall motion would support probable sub- diaphragmatic attenuation, however cannot rule out prior inferior infarct given his history of RCA disease. If true prior infarct  there is no peri-infarct ischemia.  2. Normal left ventricular wall motion.  3. Left ventricular ejection fraction 76%  4. Low-risk stress test findings*. Duke treadmill score of 7 supports low risk for major cardiac events.  Assessment and Plan:  1. CAD status post DES to the RCA in May 2011. He remains stable without active angina, follow-up Cardiolite was low risk as outlined above. Plan is to continue current medical regimen, aspirin every other day at this point.  2. Hyperlipidemia with statin intolerance. He does take omega-3 supplements. Watches his diet. Recent LDL 130.  3. Essential hypertension, no changes made to current regimen. Encouraged regular exercise plan.  4. Obstructive sleep apnea, followed by primary care.  Current medicines were reviewed with the patient today.  Disposition: FU with me in 6 months.   Signed, Satira Sark, MD, [facility] 10/15/2015 2:38 PM    [facility] [place], [place], [place] [address] Phone: [phone]; Fax: [phone]

## 2015-10-15 NOTE — Patient Instructions (Signed)
Your physician has recommended you make the following change in your medication:  Please take your aspirin every other day. Continue all other medications the same. Your physician recommends that you schedule a follow-up appointment in: 6 months. You will receive a reminder letter in the mail in about 4 months reminding you to call and schedule your appointment. If you don't receive this letter, please contact our office.

## 2015-10-25 DIAGNOSIS — M545 Low back pain: Secondary | ICD-10-CM | POA: Diagnosis not present

## 2015-10-25 DIAGNOSIS — M47816 Spondylosis without myelopathy or radiculopathy, lumbar region: Secondary | ICD-10-CM | POA: Diagnosis not present

## 2015-10-25 DIAGNOSIS — M9903 Segmental and somatic dysfunction of lumbar region: Secondary | ICD-10-CM | POA: Diagnosis not present

## 2015-10-25 DIAGNOSIS — M5441 Lumbago with sciatica, right side: Secondary | ICD-10-CM | POA: Diagnosis not present

## 2015-10-26 DIAGNOSIS — M545 Low back pain: Secondary | ICD-10-CM | POA: Diagnosis not present

## 2015-10-26 DIAGNOSIS — M47816 Spondylosis without myelopathy or radiculopathy, lumbar region: Secondary | ICD-10-CM | POA: Diagnosis not present

## 2015-10-26 DIAGNOSIS — M9903 Segmental and somatic dysfunction of lumbar region: Secondary | ICD-10-CM | POA: Diagnosis not present

## 2015-10-26 DIAGNOSIS — M5441 Lumbago with sciatica, right side: Secondary | ICD-10-CM | POA: Diagnosis not present

## 2015-10-29 DIAGNOSIS — S335XXA Sprain of ligaments of lumbar spine, initial encounter: Secondary | ICD-10-CM | POA: Diagnosis not present

## 2015-10-29 DIAGNOSIS — M545 Low back pain: Secondary | ICD-10-CM | POA: Diagnosis not present

## 2015-10-29 DIAGNOSIS — M9903 Segmental and somatic dysfunction of lumbar region: Secondary | ICD-10-CM | POA: Diagnosis not present

## 2015-10-29 DIAGNOSIS — M5441 Lumbago with sciatica, right side: Secondary | ICD-10-CM | POA: Diagnosis not present

## 2015-10-29 DIAGNOSIS — M47816 Spondylosis without myelopathy or radiculopathy, lumbar region: Secondary | ICD-10-CM | POA: Diagnosis not present

## 2015-10-30 DIAGNOSIS — M47816 Spondylosis without myelopathy or radiculopathy, lumbar region: Secondary | ICD-10-CM | POA: Diagnosis not present

## 2015-10-30 DIAGNOSIS — M545 Low back pain: Secondary | ICD-10-CM | POA: Diagnosis not present

## 2015-10-30 DIAGNOSIS — S335XXA Sprain of ligaments of lumbar spine, initial encounter: Secondary | ICD-10-CM | POA: Diagnosis not present

## 2015-10-30 DIAGNOSIS — M9903 Segmental and somatic dysfunction of lumbar region: Secondary | ICD-10-CM | POA: Diagnosis not present

## 2015-10-30 DIAGNOSIS — M5441 Lumbago with sciatica, right side: Secondary | ICD-10-CM | POA: Diagnosis not present

## 2015-10-31 DIAGNOSIS — M9903 Segmental and somatic dysfunction of lumbar region: Secondary | ICD-10-CM | POA: Diagnosis not present

## 2015-10-31 DIAGNOSIS — M47816 Spondylosis without myelopathy or radiculopathy, lumbar region: Secondary | ICD-10-CM | POA: Diagnosis not present

## 2015-10-31 DIAGNOSIS — S335XXA Sprain of ligaments of lumbar spine, initial encounter: Secondary | ICD-10-CM | POA: Diagnosis not present

## 2015-10-31 DIAGNOSIS — M5441 Lumbago with sciatica, right side: Secondary | ICD-10-CM | POA: Diagnosis not present

## 2015-10-31 DIAGNOSIS — M545 Low back pain: Secondary | ICD-10-CM | POA: Diagnosis not present

## 2015-11-01 DIAGNOSIS — M47816 Spondylosis without myelopathy or radiculopathy, lumbar region: Secondary | ICD-10-CM | POA: Diagnosis not present

## 2015-11-01 DIAGNOSIS — M545 Low back pain: Secondary | ICD-10-CM | POA: Diagnosis not present

## 2015-11-01 DIAGNOSIS — M5441 Lumbago with sciatica, right side: Secondary | ICD-10-CM | POA: Diagnosis not present

## 2015-11-01 DIAGNOSIS — M9903 Segmental and somatic dysfunction of lumbar region: Secondary | ICD-10-CM | POA: Diagnosis not present

## 2015-11-01 DIAGNOSIS — S335XXA Sprain of ligaments of lumbar spine, initial encounter: Secondary | ICD-10-CM | POA: Diagnosis not present

## 2015-11-02 DIAGNOSIS — M5441 Lumbago with sciatica, right side: Secondary | ICD-10-CM | POA: Diagnosis not present

## 2015-11-02 DIAGNOSIS — M47816 Spondylosis without myelopathy or radiculopathy, lumbar region: Secondary | ICD-10-CM | POA: Diagnosis not present

## 2015-11-02 DIAGNOSIS — M9903 Segmental and somatic dysfunction of lumbar region: Secondary | ICD-10-CM | POA: Diagnosis not present

## 2015-11-02 DIAGNOSIS — S335XXA Sprain of ligaments of lumbar spine, initial encounter: Secondary | ICD-10-CM | POA: Diagnosis not present

## 2015-11-02 DIAGNOSIS — M545 Low back pain: Secondary | ICD-10-CM | POA: Diagnosis not present

## 2015-11-12 DIAGNOSIS — M5441 Lumbago with sciatica, right side: Secondary | ICD-10-CM | POA: Diagnosis not present

## 2015-11-12 DIAGNOSIS — M47816 Spondylosis without myelopathy or radiculopathy, lumbar region: Secondary | ICD-10-CM | POA: Diagnosis not present

## 2015-11-12 DIAGNOSIS — S335XXA Sprain of ligaments of lumbar spine, initial encounter: Secondary | ICD-10-CM | POA: Diagnosis not present

## 2015-11-12 DIAGNOSIS — M9903 Segmental and somatic dysfunction of lumbar region: Secondary | ICD-10-CM | POA: Diagnosis not present

## 2015-11-12 DIAGNOSIS — M545 Low back pain: Secondary | ICD-10-CM | POA: Diagnosis not present

## 2015-11-14 DIAGNOSIS — J320 Chronic maxillary sinusitis: Secondary | ICD-10-CM | POA: Diagnosis not present

## 2015-11-15 DIAGNOSIS — M545 Low back pain: Secondary | ICD-10-CM | POA: Diagnosis not present

## 2015-11-15 DIAGNOSIS — M9903 Segmental and somatic dysfunction of lumbar region: Secondary | ICD-10-CM | POA: Diagnosis not present

## 2015-11-15 DIAGNOSIS — S335XXA Sprain of ligaments of lumbar spine, initial encounter: Secondary | ICD-10-CM | POA: Diagnosis not present

## 2015-11-15 DIAGNOSIS — M5441 Lumbago with sciatica, right side: Secondary | ICD-10-CM | POA: Diagnosis not present

## 2015-11-15 DIAGNOSIS — M47816 Spondylosis without myelopathy or radiculopathy, lumbar region: Secondary | ICD-10-CM | POA: Diagnosis not present

## 2015-11-20 DIAGNOSIS — M545 Low back pain: Secondary | ICD-10-CM | POA: Diagnosis not present

## 2015-11-20 DIAGNOSIS — M5441 Lumbago with sciatica, right side: Secondary | ICD-10-CM | POA: Diagnosis not present

## 2015-11-20 DIAGNOSIS — M47816 Spondylosis without myelopathy or radiculopathy, lumbar region: Secondary | ICD-10-CM | POA: Diagnosis not present

## 2015-11-20 DIAGNOSIS — M9903 Segmental and somatic dysfunction of lumbar region: Secondary | ICD-10-CM | POA: Diagnosis not present

## 2015-11-20 DIAGNOSIS — S335XXA Sprain of ligaments of lumbar spine, initial encounter: Secondary | ICD-10-CM | POA: Diagnosis not present

## 2015-11-27 DIAGNOSIS — M9903 Segmental and somatic dysfunction of lumbar region: Secondary | ICD-10-CM | POA: Diagnosis not present

## 2015-11-27 DIAGNOSIS — M545 Low back pain: Secondary | ICD-10-CM | POA: Diagnosis not present

## 2015-11-27 DIAGNOSIS — M47816 Spondylosis without myelopathy or radiculopathy, lumbar region: Secondary | ICD-10-CM | POA: Diagnosis not present

## 2015-11-27 DIAGNOSIS — S335XXA Sprain of ligaments of lumbar spine, initial encounter: Secondary | ICD-10-CM | POA: Diagnosis not present

## 2015-11-27 DIAGNOSIS — M5441 Lumbago with sciatica, right side: Secondary | ICD-10-CM | POA: Diagnosis not present

## 2015-12-04 DIAGNOSIS — M545 Low back pain: Secondary | ICD-10-CM | POA: Diagnosis not present

## 2015-12-04 DIAGNOSIS — M47816 Spondylosis without myelopathy or radiculopathy, lumbar region: Secondary | ICD-10-CM | POA: Diagnosis not present

## 2015-12-04 DIAGNOSIS — M9903 Segmental and somatic dysfunction of lumbar region: Secondary | ICD-10-CM | POA: Diagnosis not present

## 2015-12-04 DIAGNOSIS — S335XXA Sprain of ligaments of lumbar spine, initial encounter: Secondary | ICD-10-CM | POA: Diagnosis not present

## 2015-12-04 DIAGNOSIS — M5441 Lumbago with sciatica, right side: Secondary | ICD-10-CM | POA: Diagnosis not present

## 2016-01-01 DIAGNOSIS — R0789 Other chest pain: Secondary | ICD-10-CM | POA: Diagnosis not present

## 2016-01-09 DIAGNOSIS — J320 Chronic maxillary sinusitis: Secondary | ICD-10-CM | POA: Diagnosis not present

## 2016-01-15 DIAGNOSIS — M5441 Lumbago with sciatica, right side: Secondary | ICD-10-CM | POA: Diagnosis not present

## 2016-01-15 DIAGNOSIS — M47816 Spondylosis without myelopathy or radiculopathy, lumbar region: Secondary | ICD-10-CM | POA: Diagnosis not present

## 2016-01-15 DIAGNOSIS — S335XXA Sprain of ligaments of lumbar spine, initial encounter: Secondary | ICD-10-CM | POA: Diagnosis not present

## 2016-01-15 DIAGNOSIS — M545 Low back pain: Secondary | ICD-10-CM | POA: Diagnosis not present

## 2016-01-15 DIAGNOSIS — M9903 Segmental and somatic dysfunction of lumbar region: Secondary | ICD-10-CM | POA: Diagnosis not present

## 2016-01-31 DIAGNOSIS — I10 Essential (primary) hypertension: Secondary | ICD-10-CM | POA: Diagnosis not present

## 2016-01-31 DIAGNOSIS — S42211A Unspecified displaced fracture of surgical neck of right humerus, initial encounter for closed fracture: Secondary | ICD-10-CM | POA: Diagnosis not present

## 2016-01-31 DIAGNOSIS — S0990XA Unspecified injury of head, initial encounter: Secondary | ICD-10-CM | POA: Diagnosis not present

## 2016-01-31 DIAGNOSIS — T07 Unspecified multiple injuries: Secondary | ICD-10-CM | POA: Diagnosis not present

## 2016-01-31 DIAGNOSIS — S42309A Unspecified fracture of shaft of humerus, unspecified arm, initial encounter for closed fracture: Secondary | ICD-10-CM | POA: Diagnosis not present

## 2016-01-31 DIAGNOSIS — S01511A Laceration without foreign body of lip, initial encounter: Secondary | ICD-10-CM | POA: Diagnosis not present

## 2016-01-31 DIAGNOSIS — R0781 Pleurodynia: Secondary | ICD-10-CM | POA: Diagnosis not present

## 2016-01-31 DIAGNOSIS — S299XXA Unspecified injury of thorax, initial encounter: Secondary | ICD-10-CM | POA: Diagnosis not present

## 2016-01-31 DIAGNOSIS — S199XXA Unspecified injury of neck, initial encounter: Secondary | ICD-10-CM | POA: Diagnosis not present

## 2016-02-04 DIAGNOSIS — S60221A Contusion of right hand, initial encounter: Secondary | ICD-10-CM | POA: Diagnosis not present

## 2016-02-04 DIAGNOSIS — S42294A Other nondisplaced fracture of upper end of right humerus, initial encounter for closed fracture: Secondary | ICD-10-CM | POA: Diagnosis not present

## 2016-02-04 DIAGNOSIS — S2231XA Fracture of one rib, right side, initial encounter for closed fracture: Secondary | ICD-10-CM | POA: Diagnosis not present

## 2016-02-05 ENCOUNTER — Telehealth: Payer: Self-pay | Admitting: Cardiology

## 2016-02-05 NOTE — Telephone Encounter (Signed)
Patient fell on 01-31-16 -Was seen at an Urgent Care at [facility] . Was given Ibuprofen 600mg   Needs to know if this medication will affect his heart.   Please call # [phone].

## 2016-02-05 NOTE — Telephone Encounter (Signed)
Patient called to inform office that he was given ibuprofen 600 mg and percocet to used temporary for pain related to a recent fall. Patient advised that ibuprofen as well as all NSAIDS were not recommended for cardiac patients since it would put them at a higher risk for a cardiac event. Patient advised patient that percocet and tylenol would be a better option for temporary usage only. Patient verbalized understanding of plan.

## 2016-02-08 DIAGNOSIS — M25572 Pain in left ankle and joints of left foot: Secondary | ICD-10-CM | POA: Diagnosis not present

## 2016-02-08 DIAGNOSIS — M25561 Pain in right knee: Secondary | ICD-10-CM | POA: Diagnosis not present

## 2016-02-09 ENCOUNTER — Other Ambulatory Visit: Payer: Self-pay | Admitting: Cardiology

## 2016-02-11 DIAGNOSIS — M25572 Pain in left ankle and joints of left foot: Secondary | ICD-10-CM | POA: Diagnosis not present

## 2016-02-11 DIAGNOSIS — S42294D Other nondisplaced fracture of upper end of right humerus, subsequent encounter for fracture with routine healing: Secondary | ICD-10-CM | POA: Diagnosis not present

## 2016-02-11 DIAGNOSIS — M25561 Pain in right knee: Secondary | ICD-10-CM | POA: Diagnosis not present

## 2016-02-15 DIAGNOSIS — A048 Other specified bacterial intestinal infections: Secondary | ICD-10-CM | POA: Diagnosis not present

## 2016-02-16 DIAGNOSIS — A048 Other specified bacterial intestinal infections: Secondary | ICD-10-CM | POA: Diagnosis not present

## 2016-02-19 DIAGNOSIS — S42294D Other nondisplaced fracture of upper end of right humerus, subsequent encounter for fracture with routine healing: Secondary | ICD-10-CM | POA: Diagnosis not present

## 2016-02-28 DIAGNOSIS — E782 Mixed hyperlipidemia: Secondary | ICD-10-CM | POA: Diagnosis not present

## 2016-02-28 DIAGNOSIS — D692 Other nonthrombocytopenic purpura: Secondary | ICD-10-CM | POA: Diagnosis not present

## 2016-02-28 DIAGNOSIS — R7301 Impaired fasting glucose: Secondary | ICD-10-CM | POA: Diagnosis not present

## 2016-02-28 DIAGNOSIS — I10 Essential (primary) hypertension: Secondary | ICD-10-CM | POA: Diagnosis not present

## 2016-02-28 DIAGNOSIS — E7800 Pure hypercholesterolemia, unspecified: Secondary | ICD-10-CM | POA: Diagnosis not present

## 2016-02-28 DIAGNOSIS — E781 Pure hyperglyceridemia: Secondary | ICD-10-CM | POA: Diagnosis not present

## 2016-03-03 DIAGNOSIS — S4290XA Fracture of unspecified shoulder girdle, part unspecified, initial encounter for closed fracture: Secondary | ICD-10-CM | POA: Diagnosis not present

## 2016-03-03 DIAGNOSIS — M069 Rheumatoid arthritis, unspecified: Secondary | ICD-10-CM | POA: Diagnosis not present

## 2016-03-03 DIAGNOSIS — D692 Other nonthrombocytopenic purpura: Secondary | ICD-10-CM | POA: Diagnosis not present

## 2016-03-03 DIAGNOSIS — K210 Gastro-esophageal reflux disease with esophagitis: Secondary | ICD-10-CM | POA: Diagnosis not present

## 2016-03-03 DIAGNOSIS — I2510 Atherosclerotic heart disease of native coronary artery without angina pectoris: Secondary | ICD-10-CM | POA: Diagnosis not present

## 2016-03-03 DIAGNOSIS — E782 Mixed hyperlipidemia: Secondary | ICD-10-CM | POA: Diagnosis not present

## 2016-03-03 DIAGNOSIS — I10 Essential (primary) hypertension: Secondary | ICD-10-CM | POA: Diagnosis not present

## 2016-03-03 DIAGNOSIS — E8881 Metabolic syndrome: Secondary | ICD-10-CM | POA: Diagnosis not present

## 2016-03-03 DIAGNOSIS — R7301 Impaired fasting glucose: Secondary | ICD-10-CM | POA: Diagnosis not present

## 2016-03-03 DIAGNOSIS — G4733 Obstructive sleep apnea (adult) (pediatric): Secondary | ICD-10-CM | POA: Diagnosis not present

## 2016-03-03 DIAGNOSIS — M109 Gout, unspecified: Secondary | ICD-10-CM | POA: Diagnosis not present

## 2016-03-14 DIAGNOSIS — G4733 Obstructive sleep apnea (adult) (pediatric): Secondary | ICD-10-CM | POA: Diagnosis not present

## 2016-03-17 DIAGNOSIS — S42294D Other nondisplaced fracture of upper end of right humerus, subsequent encounter for fracture with routine healing: Secondary | ICD-10-CM | POA: Diagnosis not present

## 2016-03-25 DIAGNOSIS — M1A9XX1 Chronic gout, unspecified, with tophus (tophi): Secondary | ICD-10-CM | POA: Diagnosis not present

## 2016-03-25 DIAGNOSIS — M6281 Muscle weakness (generalized): Secondary | ICD-10-CM | POA: Diagnosis not present

## 2016-03-25 DIAGNOSIS — M25511 Pain in right shoulder: Secondary | ICD-10-CM | POA: Diagnosis not present

## 2016-03-25 DIAGNOSIS — L03011 Cellulitis of right finger: Secondary | ICD-10-CM | POA: Diagnosis not present

## 2016-03-27 DIAGNOSIS — L853 Xerosis cutis: Secondary | ICD-10-CM | POA: Diagnosis not present

## 2016-03-27 DIAGNOSIS — L438 Other lichen planus: Secondary | ICD-10-CM | POA: Diagnosis not present

## 2016-03-27 DIAGNOSIS — Z5181 Encounter for therapeutic drug level monitoring: Secondary | ICD-10-CM | POA: Diagnosis not present

## 2016-03-27 DIAGNOSIS — L439 Lichen planus, unspecified: Secondary | ICD-10-CM | POA: Diagnosis not present

## 2016-03-27 DIAGNOSIS — L570 Actinic keratosis: Secondary | ICD-10-CM | POA: Diagnosis not present

## 2016-03-27 DIAGNOSIS — Z888 Allergy status to other drugs, medicaments and biological substances status: Secondary | ICD-10-CM | POA: Diagnosis not present

## 2016-03-27 DIAGNOSIS — Z880 Allergy status to penicillin: Secondary | ICD-10-CM | POA: Diagnosis not present

## 2016-03-27 DIAGNOSIS — M1A0411 Idiopathic chronic gout, right hand, with tophus (tophi): Secondary | ICD-10-CM | POA: Diagnosis not present

## 2016-03-27 DIAGNOSIS — Z881 Allergy status to other antibiotic agents status: Secondary | ICD-10-CM | POA: Diagnosis not present

## 2016-03-27 DIAGNOSIS — Z79899 Other long term (current) drug therapy: Secondary | ICD-10-CM | POA: Diagnosis not present

## 2016-03-28 DIAGNOSIS — M25511 Pain in right shoulder: Secondary | ICD-10-CM | POA: Diagnosis not present

## 2016-03-28 DIAGNOSIS — M6281 Muscle weakness (generalized): Secondary | ICD-10-CM | POA: Diagnosis not present

## 2016-03-31 DIAGNOSIS — M25511 Pain in right shoulder: Secondary | ICD-10-CM | POA: Diagnosis not present

## 2016-03-31 DIAGNOSIS — M6281 Muscle weakness (generalized): Secondary | ICD-10-CM | POA: Diagnosis not present

## 2016-04-02 DIAGNOSIS — M6281 Muscle weakness (generalized): Secondary | ICD-10-CM | POA: Diagnosis not present

## 2016-04-02 DIAGNOSIS — M25511 Pain in right shoulder: Secondary | ICD-10-CM | POA: Diagnosis not present

## 2016-04-07 ENCOUNTER — Ambulatory Visit (INDEPENDENT_AMBULATORY_CARE_PROVIDER_SITE_OTHER): Payer: Medicare Other | Admitting: Cardiology

## 2016-04-07 ENCOUNTER — Encounter: Payer: Self-pay | Admitting: Cardiology

## 2016-04-07 VITALS — BP 134/82 | HR 67 | Ht 68.0 in | Wt 166.0 lb

## 2016-04-07 DIAGNOSIS — E782 Mixed hyperlipidemia: Secondary | ICD-10-CM | POA: Diagnosis not present

## 2016-04-07 DIAGNOSIS — I2510 Atherosclerotic heart disease of native coronary artery without angina pectoris: Secondary | ICD-10-CM | POA: Diagnosis not present

## 2016-04-07 DIAGNOSIS — G4733 Obstructive sleep apnea (adult) (pediatric): Secondary | ICD-10-CM | POA: Diagnosis not present

## 2016-04-07 DIAGNOSIS — I10 Essential (primary) hypertension: Secondary | ICD-10-CM | POA: Diagnosis not present

## 2016-04-07 NOTE — Progress Notes (Signed)
Cardiology Office Note  Date: 04/07/2016   ID: Kevin Mathis, DOB 12/03/1936, MRN [id number]  PCP: [name], MD  Primary Cardiologist: Rozann Lesches, MD   Chief Complaint  Patient presents with  . Coronary Artery Disease    History of Present Illness: Kevin Mathis is a 80 y.o. male last seen in October 2016. He presents for a routine follow-up visit. Since last encounter he reports no problems with angina or nitroglycerin requirement. He has NYHA class II dyspnea with typical activities. I reviewed his medications which are outlined below. From a cardiac perspective he continues on aspirin, Norvasc, Lopressor, Lovaza, and as needed nitroglycerin. Follow-up stress testing from April of last year was low risk as outlined below. ECG today reviewed and shows sinus bradycardia with nonspecific T-wave changes.  I went over his recent lab work. LDL was 65. HDL 26. He is not exercising regularly. We did talk about an exercise plan with walking regimen.  He states that he had a fall when he was visiting down in [place] with his wife and broke his right shoulder. This has healed and he is undergoing physical therapy. Has fairly good range of motion. There were no associated palpitations or syncope with this fall - he tripped.    Past Medical History  Diagnosis Date  . Coronary atherosclerosis of native coronary artery     DES RCA 5/11, LVEF 55%  . Hiatal hernia   . Pulmonary nodules   . Obstructive sleep apnea   . Prostate cancer ([place])   . Essential hypertension, benign   . Mixed hyperlipidemia   . GERD (gastroesophageal reflux disease)   . Rheumatoid arthritis [facility])     Past Surgical History  Procedure Laterality Date  . Appendectomy    . Hemorrhoid surgery    . Prostatectomy    . Laparoscopic cholecystectomy    . Skin cancer resection      Current Outpatient Prescriptions  Medication Sig Dispense Refill  . amLODipine (NORVASC) 5 MG tablet Take 1 tablet (5 mg  total) by mouth daily. 90 tablet 3  . aspirin 81 MG tablet Take 1 tablet (81 mg total) by mouth every other day. (Patient taking differently: Take 81 mg by mouth daily. ) 30 tablet 3  . cevimeline (EVOXAC) 30 MG capsule Take 30 mg by mouth 2 (two) times daily. May add 1 tab at bedtime as needed.    . Cholecalciferol (VITAMIN D) 2000 units CAPS Take 1 capsule by mouth daily.    Marland Kitchen CINNAMON PO Take 3,000 mg by mouth daily.    . clobetasol (TEMOVATE) 0.05 % GEL Apply topically as needed.    . colchicine 0.6 MG tablet Take 0.6 mg by mouth 2 (two) times daily.      Marland Kitchen esomeprazole (NEXIUM) 40 MG capsule Take 40 mg by mouth daily.     Marland Kitchen gabapentin (NEURONTIN) 100 MG capsule TAKE 3 CAPSULES AT BEDTIME 270 capsule 3  . metoprolol tartrate (LOPRESSOR) 25 MG tablet TAKE ONE-HALF (1/2) TABLET TWICE A DAY 180 tablet 2  . Multiple Vitamin (MULTIVITAMIN) tablet Take 1 tablet by mouth daily.      . nitroGLYCERIN (NITROSTAT) 0.4 MG SL tablet Place 1 tablet (0.4 mg total) under the tongue every 5 (five) minutes as needed. 25 tablet 3  . NON FORMULARY CPAP Use as directed     . omega-3 acid ethyl esters (LOVAZA) 1 g capsule TAKE 2 CAPSULES TWICE A DAY 360 capsule 1  . Probiotic Product (  PROBIOTIC DAILY PO) Take 1 Can by mouth daily.    . Thiamine HCl (VITAMIN B-1) 250 MG tablet Take 250 mg by mouth daily.    . vitamin B-12 (CYANOCOBALAMIN) 1000 MCG tablet Take 1,000 mcg by mouth daily.    Marland Kitchen zinc gluconate 50 MG tablet Take 50 mg by mouth 2 (two) times daily.      No current facility-administered medications for this visit.   Allergies:  Cefprozil; Levofloxacin; Penicillins; and Statins   Social History: The patient  reports that he quit smoking about 24 years ago. His smoking use included Cigarettes. He started smoking about 64 years ago. He has a 40 pack-year smoking history. He has never used smokeless tobacco. He reports that he drinks alcohol. He reports that he does not use illicit drugs.   ROS:  Please  see the history of present illness. Otherwise, complete review of systems is positive for mild right shoulder stiffness, also having trouble with intermittent diarrhea, plans to see GI specialist soon.  All other systems are reviewed and negative.   Physical Exam: VS:  BP 134/82 mmHg  Pulse 67  Ht 5\' 8"  (1.727 m)  Wt 166 lb (75.297 kg)  BMI 25.25 kg/m2, BMI Body mass index is 25.25 kg/(m^2).  Wt Readings from Last 3 Encounters:  04/07/16 166 lb (75.297 kg)  10/15/15 177 lb (80.287 kg)  06/29/15 180 lb (81.647 kg)    General: Patient appears comfortable at rest. HEENT: Conjunctiva and lids normal, oropharynx clear. Neck: Supple, no elevated JVP or carotid bruits, no thyromegaly. Lungs: Clear to auscultation, nonlabored breathing at rest. Cardiac: Regular rate and rhythm with intermittent ectopy, no S3 or significant systolic murmur, no pericardial rub. Abdomen: Soft, nontender, no hepatomegaly, bowel sounds present, no guarding or rebound. Extremities: No pitting edema, distal pulses 2+. Skin: Warm and dry. Musculoskeletal: No kyphosis. Neuropsychiatric: Alert and oriented x3, affect grossly appropriate.  ECG: I personally reviewed the prior tracing from 04/17/2015 which showed sinus bradycardia.  Recent Labwork:  September 2016: Cholesterol 194, triglycerides 98, HDL 44, LDL 130, hemoglobin 14.2, platelets 151, BUN 13, creatinine 0.9, potassium 5.4, AST 31, ALT 39, hemoglobin A1c 5.06 March 2016: Cholesterol 127, triglycerides 181, HDL 26, LDL 65  Other Studies Reviewed Today:  Exercise Cardiolite 04/24/2015: FINDINGS: Exercise stress  Baseline EKG shows normal sinus rhythm. The patient was stressed according to the Bruce protocol for 7 minutes and 1 second achieving a work level of 8.30 Mets. The resting heart rate is 60 beats per minute rose to a maximum of 134 beats per minute, representing 93% of the maximal age predicted heart rate. The resting blood pressure of 148/87  increased to a maximum of 189/93. The test was stopped due to fatigue, the patient did not experience any chest pain. Stress EKG showed no specific ischemic changes and no significant arrhythmias.  Perfusion: There is a medium sized moderate intensity fixed inferior wall defect. The inferior wall has normal wall motion. Findings are most consistent with sub- diaphragmatic attenuation, however cannot rule out prior inferior infarct.  Wall Motion: Normal left ventricular wall motion. No left ventricular dilation.  Left Ventricular Ejection Fraction: 76 %  End diastolic volume 64 ml  End systolic volume 15 ml  IMPRESSION: 1. Medium size moderate intensity inferior wall defect. Normal wall motion would support probable sub- diaphragmatic attenuation, however cannot rule out prior inferior infarct given his history of RCA disease. If true prior infarct there is no peri-infarct ischemia.  2. Normal left ventricular  wall motion.  3. Left ventricular ejection fraction 76%  4. Low-risk stress test findings*. Duke treadmill score of 7 supports low risk for major cardiac events.  Assessment and Plan:  1. Symptomatically stable CAD status post DES to the RCA in 2011. Cardiolite study from last year was low risk. I reviewed his ECG today which is stable. Recommended walking regimen for exercise and continued observation. No change in medical regimen.  2. Hyperlipidemia with recent LDL 65. He continues on Lovaza and has history of statin intolerance.  3. Essential hypertension, blood pressure is adequately controlled today.  4. Obstructive sleep apnea, on CPAP.  Current medicines were reviewed with the patient today.   Orders Placed This Encounter  Procedures  . EKG 12-Lead    Disposition: FU with me in 6 months.   Signed, Satira Sark, MD, [facility] 04/07/2016 8:38 AM    [facility] [place], [place], [place] [address] Phone:  [phone]; Fax: [phone]

## 2016-04-07 NOTE — Patient Instructions (Signed)
Your physician recommends that you continue on your current medications as directed. Please refer to the Current Medication list given to you today. Your physician recommends that you schedule a follow-up appointment in: 6 months. You will receive a reminder letter in the mail in about 4 months reminding you to call and schedule your appointment. If you don't receive this letter, please contact our office. 

## 2016-04-08 DIAGNOSIS — M6281 Muscle weakness (generalized): Secondary | ICD-10-CM | POA: Diagnosis not present

## 2016-04-08 DIAGNOSIS — M25511 Pain in right shoulder: Secondary | ICD-10-CM | POA: Diagnosis not present

## 2016-04-10 DIAGNOSIS — M6281 Muscle weakness (generalized): Secondary | ICD-10-CM | POA: Diagnosis not present

## 2016-04-10 DIAGNOSIS — M25511 Pain in right shoulder: Secondary | ICD-10-CM | POA: Diagnosis not present

## 2016-04-14 DIAGNOSIS — S42294D Other nondisplaced fracture of upper end of right humerus, subsequent encounter for fracture with routine healing: Secondary | ICD-10-CM | POA: Diagnosis not present

## 2016-04-14 DIAGNOSIS — M25511 Pain in right shoulder: Secondary | ICD-10-CM | POA: Diagnosis not present

## 2016-04-14 DIAGNOSIS — M6281 Muscle weakness (generalized): Secondary | ICD-10-CM | POA: Diagnosis not present

## 2016-04-16 DIAGNOSIS — M6281 Muscle weakness (generalized): Secondary | ICD-10-CM | POA: Diagnosis not present

## 2016-04-16 DIAGNOSIS — M25511 Pain in right shoulder: Secondary | ICD-10-CM | POA: Diagnosis not present

## 2016-04-28 DIAGNOSIS — R197 Diarrhea, unspecified: Secondary | ICD-10-CM | POA: Diagnosis not present

## 2016-05-12 DIAGNOSIS — S42294D Other nondisplaced fracture of upper end of right humerus, subsequent encounter for fracture with routine healing: Secondary | ICD-10-CM | POA: Diagnosis not present

## 2016-06-02 DIAGNOSIS — Z961 Presence of intraocular lens: Secondary | ICD-10-CM | POA: Diagnosis not present

## 2016-06-02 DIAGNOSIS — H35373 Puckering of macula, bilateral: Secondary | ICD-10-CM | POA: Diagnosis not present

## 2016-06-02 DIAGNOSIS — H5203 Hypermetropia, bilateral: Secondary | ICD-10-CM | POA: Diagnosis not present

## 2016-06-09 DIAGNOSIS — R197 Diarrhea, unspecified: Secondary | ICD-10-CM | POA: Diagnosis not present

## 2016-06-13 ENCOUNTER — Ambulatory Visit (INDEPENDENT_AMBULATORY_CARE_PROVIDER_SITE_OTHER): Payer: Medicare Other | Admitting: Nurse Practitioner

## 2016-06-13 ENCOUNTER — Encounter: Payer: Self-pay | Admitting: Nurse Practitioner

## 2016-06-13 VITALS — BP 120/66 | HR 66 | Ht 68.0 in

## 2016-06-13 DIAGNOSIS — G629 Polyneuropathy, unspecified: Secondary | ICD-10-CM

## 2016-06-13 DIAGNOSIS — I2510 Atherosclerotic heart disease of native coronary artery without angina pectoris: Secondary | ICD-10-CM | POA: Diagnosis not present

## 2016-06-13 DIAGNOSIS — E782 Mixed hyperlipidemia: Secondary | ICD-10-CM | POA: Diagnosis not present

## 2016-06-13 MED ORDER — GABAPENTIN 300 MG PO CAPS: 300.0000 mg | ORAL_CAPSULE | Freq: Two times a day (BID) | ORAL | Status: DC

## 2016-06-13 NOTE — Patient Instructions (Signed)
Thank you for coming to [facility]. I hope we have been able to provide you with high quality care today You may receive a patient's satisfaction survey over the next few weeks we would appreciate your feedback and comments so that we can continue to improve ourselves and the  health of our patients Take gabapentin 300mg  at 6 pm and 2 at bedtime(Dose increase) Given infromation  on gout F/U yearly

## 2016-06-13 NOTE — Progress Notes (Signed)
I reviewed above note and agree with the assessment and plan.  Rosalin Hawking, MD PhD Stroke Neurology 06/13/2016 4:42 PM

## 2016-06-13 NOTE — Progress Notes (Signed)
[facility]  PATIENT: Kevin Mathis DOB: February 01, 1936   REASON FOR VISIT follow-up for small fiber neuropathy, bilateral feet paresthesias HISTORY FROM: Patient    HISTORY OF PRESENT ILLNESS:HISTORY: 06/28/14 Kevin Mathis is a 80 years old right-handed Caucasian male, accompanied by his wife, I saw him previously March 2014, for bilateral feet paresthesia, his primary care physician is Dr. Salome Spotted He had a past medical history of coronary artery disease, hypertension, connective tissue disease of unknown type, he also has chronic whole-body itching, has been on long-term colchicine, 0.6 mg twice a day  Previous laboratory evaluation showed a positive ANA, SSA, but the rest of the laboratory evaluation including TSH, B12, CMP, p-ANCA, CMP, protein electrophoresis, Lyme titer, ACE was normal Previous electrodiagnostic study also showed no evidence of large fiber peripheral neuropathy. This has led to a skin biopsy, which showed patterns consistent with small fiber neuropathy, with decreased epidural nerve fibers at his feet, and distal leg He has been taking gabapentin 100 mg one tablet every night as needed along with Tylenol PM 2 tablets every night for his sleep, and bilateral feet paresthesia, his bilateral feet paresthesia overall has been fairly stable, he complains of numbness tingling burning cold sensation at his distal toes, plantar surface, difficulty sleeping sometimes But over the past 2 months, he seems to have increased night time bilateral feet discomfort, especially after being active during the daytime, his feet was so bothersome, sometime has difficulty sleeping, extra dose of gabapentin was helpful, but he has such sound sleep, sometimes to the point of wetting his bed,  He denies gait difficulty, denies significant low back pain, no bilateral upper extremity paresthesia UPDATE 06/29/15 Kevin Mathis, 80 year old male returns for follow-up. He has a history of  bilateral paresthesias of the feet and length dependent small fiber peripheral neuropathy etiology unclear but could be due to his long-term use of colchicine for gout. His symptoms are fairly well controlled with gabapentin. In addition he claims he has been to the chiropractor several times for manipulation and feels that his symptoms are better because of TENS and laser therapy to feet. He returns for reevaluation UPDATE 06/16/2017CM Kevin Mathis, 80 year old male returns for follow-up. He has history of bilateral paresthesias of the feet and small fiber peripheral neuropathy etiology unclear. He has been on colchicine long-term for gout. He is currently taking 600 mg of gabapentin at night with continued burning and tingling sensations in his feet. He returns for reevaluation REVIEW OF SYSTEMS: Full 14 system review of systems performed and notable only for those listed, all others are neg:  Constitutional: neg  Cardiovascular: neg Ear/Nose/Throat: neg  Skin: neg Eyes: neg Respiratory: neg Gastroitestinal: neg  Hematology/Lymphatic: Easy bruising  Endocrine: Intolerance to cold Musculoskeletal:neg Allergy/Immunology: neg Neurological: Paresthesias of the feet Psychiatric: neg Sleep : neg   ALLERGIES: Allergies  Allergen Reactions  . Cefprozil   . Levofloxacin Rash    Unknown REACTION: rash  . Penicillins Rash    REACTION: hives  . Statins Other (See Comments) and Rash    aching aching    HOME MEDICATIONS: Outpatient Prescriptions Prior to Visit  Medication Sig Dispense Refill  . amLODipine (NORVASC) 5 MG tablet Take 1 tablet (5 mg total) by mouth daily. 90 tablet 3  . aspirin 81 MG tablet Take 1 tablet (81 mg total) by mouth every other day. (Patient taking differently: Take 81 mg by mouth daily. ) 30 tablet 3  . cevimeline (EVOXAC) 30 MG capsule Take  30 mg by mouth 2 (two) times daily. May add 1 tab at bedtime as needed.    Marland Kitchen CINNAMON PO Take 3,000 mg by mouth daily.  Reported on 06/13/2016    . clobetasol (TEMOVATE) 0.05 % GEL Apply topically as needed.    . colchicine 0.6 MG tablet Take 0.6 mg by mouth 2 (two) times daily.      Marland Kitchen esomeprazole (NEXIUM) 40 MG capsule Take 40 mg by mouth daily.     . metoprolol tartrate (LOPRESSOR) 25 MG tablet TAKE ONE-HALF (1/2) TABLET TWICE A DAY 180 tablet 2  . nitroGLYCERIN (NITROSTAT) 0.4 MG SL tablet Place 1 tablet (0.4 mg total) under the tongue every 5 (five) minutes as needed. 25 tablet 3  . NON FORMULARY CPAP Use as directed     . omega-3 acid ethyl esters (LOVAZA) 1 g capsule TAKE 2 CAPSULES TWICE A DAY 360 capsule 1  . zinc gluconate 50 MG tablet Take 50 mg by mouth 2 (two) times daily.     Marland Kitchen gabapentin (NEURONTIN) 100 MG capsule TAKE 3 CAPSULES AT BEDTIME (Patient taking differently: Take 300 mg by mouth. TAKE 3 CAPSULES AT 1915 and 2115.) 270 capsule 3  . Cholecalciferol (VITAMIN D) 2000 units CAPS Take 1 capsule by mouth daily. Reported on 06/13/2016    . Multiple Vitamin (MULTIVITAMIN) tablet Take 1 tablet by mouth daily. Reported on 06/13/2016    . Probiotic Product (PROBIOTIC DAILY PO) Take 1 Can by mouth daily. Reported on 06/13/2016    . Thiamine HCl (VITAMIN B-1) 250 MG tablet Take 250 mg by mouth daily. Reported on 06/13/2016    . vitamin B-12 (CYANOCOBALAMIN) 1000 MCG tablet Take 1,000 mcg by mouth daily. Reported on 06/13/2016     No facility-administered medications prior to visit.    PAST MEDICAL HISTORY: Past Medical History  Diagnosis Date  . Coronary atherosclerosis of native coronary artery     DES RCA 5/11, LVEF 55%  . Hiatal hernia   . Pulmonary nodules   . Obstructive sleep apnea   . Prostate cancer ([place])   . Essential hypertension, benign   . Mixed hyperlipidemia   . GERD (gastroesophageal reflux disease)   . Rheumatoid arthritis ([place])     PAST SURGICAL HISTORY: Past Surgical History  Procedure Laterality Date  . Appendectomy    . Hemorrhoid surgery    . Prostatectomy    .  Laparoscopic cholecystectomy    . Skin cancer resection      FAMILY HISTORY: Family History  Problem Relation Age of Onset  . Hypertension      SOCIAL HISTORY: Social History   Social History  . Marital Status: Married    Spouse Name: N/A  . Number of Children: N/A  . Years of Education: N/A   Occupational History  . Not on file.   Social History Main Topics  . Smoking status: Former Smoker -- 1.00 packs/day for 40 years    Types: Cigarettes    Start date: 12/30/1951    Quit date: 12/30/1991  . Smokeless tobacco: Never Used  . Alcohol Use: 0.0 oz/week    0 Standard drinks or equivalent per week  . Drug Use: No  . Sexual Activity: Not on file   Other Topics Concern  . Not on file   Social History Narrative     PHYSICAL EXAM  Filed Vitals:   06/13/16 0956  BP: 120/66  Pulse: 66  Height: 5\' 8"  (1.727 m)   There is no weight  on file to calculate BMI. Generalized: In no acute distress Neck: Supple, no carotid bruits  Musculoskeletal: No deformity  Neurological examination  Mentation: Alert oriented to time, place, history taking, and causual conversation Cranial nerve II-XII: Pupils were equal round reactive to light. Extraocular movements were full. Visual field were full on confrontational test. Bilateral fundi were sharp. Facial sensation and strength were normal. Hearing was intact to finger rubbing bilaterally. Uvula tongue midline. Head turning and shoulder shrug and were normal and symmetric.Tongue protrusion into cheek strength was normal. Motor: Normal tone, bulk and strength. Sensory: mildly length dependent decreased fine touch, pinprick to distal leg, preserved vibratory sensation, and proprioception at toes. Coordination: Normal finger to nose, heel-to-shin bilaterally  Gait: Rising up from seated position without assistance, normal stance, without trunk ataxia, moderate stride, good arm swing, smooth turning, able to perform tiptoe, and heel  walking without difficulty.  Deep tendon reflexes: Brachioradialis 2/2, biceps 2/2, triceps 2/2, patellar 2/2, Achilles 2/2, plantar responses were flexor bilaterally.   DIAGNOSTIC DATA (LABS, IMAGING, TESTING) - ASSESSMENT AND PLAN 80 y.o. year old male has a past medical history of paresthesias of the feet and length dependent small fiber peripheral neuropathy of unknown etiology  may be long-term use of colchicine. The patient is a current patient of Dr. Krista Blue  who is out of the office today . This note is sent to the work in doctor.     Take gabapentin 300mg  at 6 pm and 2 at bedtime(Dose increase) Given information  on gout patient currently having a flare he has acute medications to take F/U yearly Dennie Bible, [facility], [facility], [facility] [address], [place], [place] [address] [phone] sExamination she feels better on decide on that or related to the clinic in good weekend

## 2016-06-17 ENCOUNTER — Telehealth: Payer: Self-pay | Admitting: *Deleted

## 2016-06-17 NOTE — Telephone Encounter (Signed)
Pt was taking 600mg  total dose daily when seen and had 100mg  caps. His pain is worse and the dose was increased and he was instructed to take as directed.

## 2016-06-17 NOTE — Telephone Encounter (Signed)
Spoke to [employer], [facility] at [facility].  Clarified gabapentin instructions.

## 2016-06-17 NOTE — Telephone Encounter (Signed)
I spoke to [place] with [facility].  Attempted to clarify.  Will call her back.   300mg  capsule at 6pm, 600mg  at bedtime.  (this would correspond to #270 caps).

## 2016-06-17 NOTE — Telephone Encounter (Signed)
[facility] need clarification for pt. Ref # [id number] Please advise [phone]

## 2016-06-17 NOTE — Progress Notes (Signed)
I have reviewed and agreed above plan. 

## 2016-06-23 DIAGNOSIS — R197 Diarrhea, unspecified: Secondary | ICD-10-CM | POA: Diagnosis not present

## 2016-06-26 ENCOUNTER — Ambulatory Visit: Payer: Medicare Other | Admitting: Nurse Practitioner

## 2016-06-27 DIAGNOSIS — R197 Diarrhea, unspecified: Secondary | ICD-10-CM | POA: Diagnosis not present

## 2016-06-27 DIAGNOSIS — R634 Abnormal weight loss: Secondary | ICD-10-CM | POA: Diagnosis not present

## 2016-06-28 HISTORY — PX: ESOPHAGOGASTRODUODENOSCOPY: SHX1529

## 2016-06-28 HISTORY — PX: COLONOSCOPY: SHX174

## 2016-07-02 ENCOUNTER — Encounter: Payer: Self-pay | Admitting: Cardiology

## 2016-07-02 DIAGNOSIS — Z8546 Personal history of malignant neoplasm of prostate: Secondary | ICD-10-CM | POA: Diagnosis not present

## 2016-07-02 DIAGNOSIS — J449 Chronic obstructive pulmonary disease, unspecified: Secondary | ICD-10-CM | POA: Diagnosis not present

## 2016-07-02 DIAGNOSIS — I700 Atherosclerosis of aorta: Secondary | ICD-10-CM | POA: Diagnosis not present

## 2016-07-02 DIAGNOSIS — R197 Diarrhea, unspecified: Secondary | ICD-10-CM | POA: Diagnosis not present

## 2016-07-02 DIAGNOSIS — I2510 Atherosclerotic heart disease of native coronary artery without angina pectoris: Secondary | ICD-10-CM | POA: Diagnosis not present

## 2016-07-02 DIAGNOSIS — K449 Diaphragmatic hernia without obstruction or gangrene: Secondary | ICD-10-CM | POA: Diagnosis not present

## 2016-07-02 DIAGNOSIS — R634 Abnormal weight loss: Secondary | ICD-10-CM | POA: Diagnosis not present

## 2016-07-07 DIAGNOSIS — R634 Abnormal weight loss: Secondary | ICD-10-CM | POA: Diagnosis not present

## 2016-07-07 DIAGNOSIS — K529 Noninfective gastroenteritis and colitis, unspecified: Secondary | ICD-10-CM | POA: Diagnosis not present

## 2016-07-09 DIAGNOSIS — M069 Rheumatoid arthritis, unspecified: Secondary | ICD-10-CM | POA: Diagnosis not present

## 2016-07-09 DIAGNOSIS — Z6825 Body mass index (BMI) 25.0-25.9, adult: Secondary | ICD-10-CM | POA: Diagnosis not present

## 2016-07-09 DIAGNOSIS — R634 Abnormal weight loss: Secondary | ICD-10-CM | POA: Diagnosis not present

## 2016-07-09 DIAGNOSIS — R5383 Other fatigue: Secondary | ICD-10-CM | POA: Diagnosis not present

## 2016-07-09 DIAGNOSIS — R197 Diarrhea, unspecified: Secondary | ICD-10-CM | POA: Diagnosis not present

## 2016-07-09 DIAGNOSIS — I10 Essential (primary) hypertension: Secondary | ICD-10-CM | POA: Diagnosis not present

## 2016-07-09 DIAGNOSIS — C61 Malignant neoplasm of prostate: Secondary | ICD-10-CM | POA: Diagnosis not present

## 2016-07-09 DIAGNOSIS — R430 Anosmia: Secondary | ICD-10-CM | POA: Diagnosis not present

## 2016-07-09 DIAGNOSIS — G4733 Obstructive sleep apnea (adult) (pediatric): Secondary | ICD-10-CM | POA: Diagnosis not present

## 2016-07-10 ENCOUNTER — Telehealth: Payer: Self-pay | Admitting: *Deleted

## 2016-07-10 NOTE — Telephone Encounter (Signed)
-----   Message from Satira Sark, MD sent at 07/08/2016  4:09 PM EDT ----- Results reviewed. Sent to me from Dr. Britta Mccreedy I presume who ordered the test. The findings of coronary atherosclerosis and aortic atherosclerosis are not surprising based on his history. He has known CAD with prior RCA intervention. He was clinically stable as of the last visit and had a low risk myocardial perfusion study last year. Continue with observation. A copy of this test should be forwarded to [facility], MD.

## 2016-07-10 NOTE — Telephone Encounter (Signed)
Wife informed and copy sent to PCP 

## 2016-07-16 DIAGNOSIS — I10 Essential (primary) hypertension: Secondary | ICD-10-CM | POA: Diagnosis not present

## 2016-07-16 DIAGNOSIS — K449 Diaphragmatic hernia without obstruction or gangrene: Secondary | ICD-10-CM | POA: Diagnosis not present

## 2016-07-16 DIAGNOSIS — Z7982 Long term (current) use of aspirin: Secondary | ICD-10-CM | POA: Diagnosis not present

## 2016-07-16 DIAGNOSIS — K529 Noninfective gastroenteritis and colitis, unspecified: Secondary | ICD-10-CM | POA: Diagnosis not present

## 2016-07-16 DIAGNOSIS — E785 Hyperlipidemia, unspecified: Secondary | ICD-10-CM | POA: Diagnosis not present

## 2016-07-16 DIAGNOSIS — Z9049 Acquired absence of other specified parts of digestive tract: Secondary | ICD-10-CM | POA: Diagnosis not present

## 2016-07-16 DIAGNOSIS — K219 Gastro-esophageal reflux disease without esophagitis: Secondary | ICD-10-CM | POA: Diagnosis not present

## 2016-07-16 DIAGNOSIS — Z800 Family history of malignant neoplasm of digestive organs: Secondary | ICD-10-CM | POA: Diagnosis not present

## 2016-07-16 DIAGNOSIS — Z95820 Peripheral vascular angioplasty status with implants and grafts: Secondary | ICD-10-CM | POA: Diagnosis not present

## 2016-07-16 DIAGNOSIS — M109 Gout, unspecified: Secondary | ICD-10-CM | POA: Diagnosis not present

## 2016-07-16 DIAGNOSIS — Z8546 Personal history of malignant neoplasm of prostate: Secondary | ICD-10-CM | POA: Diagnosis not present

## 2016-07-16 DIAGNOSIS — Z881 Allergy status to other antibiotic agents status: Secondary | ICD-10-CM | POA: Diagnosis not present

## 2016-07-16 DIAGNOSIS — D124 Benign neoplasm of descending colon: Secondary | ICD-10-CM | POA: Diagnosis not present

## 2016-07-16 DIAGNOSIS — K2950 Unspecified chronic gastritis without bleeding: Secondary | ICD-10-CM | POA: Diagnosis not present

## 2016-07-16 DIAGNOSIS — G4733 Obstructive sleep apnea (adult) (pediatric): Secondary | ICD-10-CM | POA: Diagnosis not present

## 2016-07-16 DIAGNOSIS — K5730 Diverticulosis of large intestine without perforation or abscess without bleeding: Secondary | ICD-10-CM | POA: Diagnosis not present

## 2016-07-16 DIAGNOSIS — Z79899 Other long term (current) drug therapy: Secondary | ICD-10-CM | POA: Diagnosis not present

## 2016-07-16 DIAGNOSIS — Z87891 Personal history of nicotine dependence: Secondary | ICD-10-CM | POA: Diagnosis not present

## 2016-07-16 DIAGNOSIS — Z86010 Personal history of colonic polyps: Secondary | ICD-10-CM | POA: Diagnosis not present

## 2016-07-16 DIAGNOSIS — I2510 Atherosclerotic heart disease of native coronary artery without angina pectoris: Secondary | ICD-10-CM | POA: Diagnosis not present

## 2016-07-16 DIAGNOSIS — Z801 Family history of malignant neoplasm of trachea, bronchus and lung: Secondary | ICD-10-CM | POA: Diagnosis not present

## 2016-07-16 DIAGNOSIS — Z880 Allergy status to penicillin: Secondary | ICD-10-CM | POA: Diagnosis not present

## 2016-07-16 DIAGNOSIS — R634 Abnormal weight loss: Secondary | ICD-10-CM | POA: Diagnosis not present

## 2016-07-16 DIAGNOSIS — Z883 Allergy status to other anti-infective agents status: Secondary | ICD-10-CM | POA: Diagnosis not present

## 2016-08-10 ENCOUNTER — Other Ambulatory Visit: Payer: Self-pay | Admitting: Cardiology

## 2016-08-20 DIAGNOSIS — N529 Male erectile dysfunction, unspecified: Secondary | ICD-10-CM | POA: Diagnosis not present

## 2016-08-20 DIAGNOSIS — C61 Malignant neoplasm of prostate: Secondary | ICD-10-CM | POA: Diagnosis not present

## 2016-08-26 ENCOUNTER — Other Ambulatory Visit: Payer: Self-pay

## 2016-09-04 ENCOUNTER — Encounter: Payer: Self-pay | Admitting: Internal Medicine

## 2016-09-15 ENCOUNTER — Ambulatory Visit (INDEPENDENT_AMBULATORY_CARE_PROVIDER_SITE_OTHER): Payer: Medicare Other | Admitting: Gastroenterology

## 2016-09-15 ENCOUNTER — Encounter: Payer: Self-pay | Admitting: Gastroenterology

## 2016-09-15 DIAGNOSIS — R945 Abnormal results of liver function studies: Secondary | ICD-10-CM

## 2016-09-15 DIAGNOSIS — K52831 Collagenous colitis: Secondary | ICD-10-CM | POA: Diagnosis not present

## 2016-09-15 DIAGNOSIS — R7989 Other specified abnormal findings of blood chemistry: Secondary | ICD-10-CM | POA: Diagnosis not present

## 2016-09-15 DIAGNOSIS — I2510 Atherosclerotic heart disease of native coronary artery without angina pectoris: Secondary | ICD-10-CM

## 2016-09-15 NOTE — Progress Notes (Signed)
    Primary Care Physician:  SASSER,PAUL W, MD Primary Gastroenterologist:  Dr. Rourk   Chief Complaint  Patient presents with  . Diarrhea    since February    HPI:   Kevin Mathis is a 80 y.o. male presenting today at the request of Sasser secondary to diarrhea. He was previously established with Dr. Benson but desires to establish care here. History of tubular adenomas in 2015 and most recently found to have collagenous colitis on colonoscopy in July 2017. Started on Uceris July 2017. He returns today doing quite well.   States he has 1-2 stools a day. States for him it is very close to his baseline. Had lost weight. Used to be about 180. Today 154. Has been remaining stable for last few weeks. Weight loss has stalled, and he is pleased with this. Wife is with him today. No abdominal pain. No nausea or vomiting. Has had more gas lately. No rectal bleeding. Nexium controls reflux but will be changing to Protonix eventually due to insurance changes. EGD performed at time of colonoscopy with chronic gastritis.   Outside labs reviewed from July 2017 with AST 88 and ALT 75, Tbili 0.6, Alk Phos 108, Albumin 4.2, Platelets mildly decreased at 132, Hgb normal at 13. Wife states an ultrasound was done at the [facility], which will be requested. Patient reports drinking about 4 beers a day but is cutting back. No known liver disease.   Past Medical History:  Diagnosis Date  . Collagenous colitis   . Coronary atherosclerosis of native coronary artery    DES RCA 5/11, LVEF 55%  . Essential hypertension, benign   . GERD (gastroesophageal reflux disease)   . Hiatal hernia   . Mixed hyperlipidemia   . Obstructive sleep apnea   . Prostate cancer ([facility])   . Pulmonary nodules   . Rheumatoid arthritis ([facility])     Past Surgical History:  Procedure Laterality Date  . APPENDECTOMY    . COLONOSCOPY  06/2016   Dr. Benson: mild diverticulosis in left colon, sessile polyp 3-7 mm in distal  descending colon, biopsies of colon to assess for microscopic colitis. path: collagenous colitis. colon polyp was benign polypoid colorectal mucosa   . COLONOSCOPY  04/2014   Dr. Benson: office notes stated 3 tubular adenomas and diverticulosis of left colon   . CORONARY ANGIOPLASTY WITH STENT PLACEMENT  2011  . ESOPHAGOGASTRODUODENOSCOPY  06/2016   Dr. Benson: gastritis, path: chronic active gastritis, negative H.pylori   . HEMORRHOID SURGERY    . LAPAROSCOPIC CHOLECYSTECTOMY    . PROSTATECTOMY    . Skin cancer resection      Current Outpatient Prescriptions  Medication Sig Dispense Refill  . amLODipine (NORVASC) 5 MG tablet Take 1 tablet (5 mg total) by mouth daily. 90 tablet 3  . aspirin 81 MG tablet Take 1 tablet (81 mg total) by mouth every other day. (Patient taking differently: Take 81 mg by mouth daily. ) 30 tablet 3  . Budesonide (UCERIS) 9 MG TB24 Take 1 tablet by mouth daily.    . cevimeline (EVOXAC) 30 MG capsule Take 30 mg by mouth 2 (two) times daily. May add 1 tab at bedtime as needed.    . Cholecalciferol (VITAMIN D) 2000 units CAPS Take 1 capsule by mouth daily. Reported on 06/13/2016    . CINNAMON PO Take 3,000 mg by mouth daily. Reported on 06/13/2016    . clobetasol (TEMOVATE) 0.05 % GEL Apply topically as needed.    .   colchicine 0.6 MG tablet Take 0.6 mg by mouth 2 (two) times daily.      . esomeprazole (NEXIUM) 40 MG capsule Take 40 mg by mouth daily.     . gabapentin (NEURONTIN) 300 MG capsule Take 1 capsule (300 mg total) by mouth 2 (two) times daily. Take 1 around 6pm and 2 at bedtime 270 capsule 3  . metoprolol tartrate (LOPRESSOR) 25 MG tablet TAKE ONE-HALF (1/2) TABLET TWICE A DAY 180 tablet 2  . Multiple Vitamin (MULTIVITAMIN) tablet Take 1 tablet by mouth daily. Reported on 06/13/2016    . nitroGLYCERIN (NITROSTAT) 0.4 MG SL tablet Place 1 tablet (0.4 mg total) under the tongue every 5 (five) minutes as needed. 25 tablet 3  . NON FORMULARY CPAP Use as directed      . omega-3 acid ethyl esters (LOVAZA) 1 g capsule TAKE 2 CAPSULES TWICE A DAY 360 capsule 3  . Probiotic Product (PROBIOTIC DAILY PO) Take 1 Can by mouth daily. Reported on 06/13/2016    . Thiamine HCl (VITAMIN B-1) 250 MG tablet Take 250 mg by mouth daily. Reported on 06/13/2016    . vitamin B-12 (CYANOCOBALAMIN) 1000 MCG tablet Take 1,000 mcg by mouth daily. Reported on 06/13/2016    . zinc gluconate 50 MG tablet Take 50 mg by mouth 2 (two) times daily.      No current facility-administered medications for this visit.     Allergies as of 09/15/2016 - Review Complete 09/15/2016  Allergen Reaction Noted  . Cefprozil  04/17/2015  . Levofloxacin Rash   . Penicillins Rash   . Statins Other (See Comments) and Rash 11/05/2011    Family History  Problem Relation Age of Onset  . Colon cancer Mother 82  . Hypertension      Social History   Social History  . Marital status: Married    Spouse name: N/A  . Number of children: N/A  . Years of education: N/A   Occupational History  . Not on file.   Social History Main Topics  . Smoking status: Former Smoker    Packs/day: 1.00    Years: 40.00    Types: Cigarettes    Start date: 12/30/1951    Quit date: 12/30/1991  . Smokeless tobacco: Never Used  . Alcohol use 0.0 oz/week     Comment: 1 happy hour beer a day   . Drug use: No  . Sexual activity: Not on file   Other Topics Concern  . Not on file   Social History Narrative  . No narrative on file    Review of Systems: Gen: Denies any fever, chills, fatigue, weight loss, lack of appetite.  CV: Denies chest pain, heart palpitations, peripheral edema, syncope.  Resp: Denies shortness of breath at rest or with exertion. Denies wheezing or cough.  GI: see HPI  GU : Denies urinary burning, urinary frequency, urinary hesitancy MS: Denies joint pain, muscle weakness, cramps, or limitation of movement.  Derm: Denies rash, itching, dry skin Psych: Denies depression, anxiety, memory  loss, and confusion Heme: Denies bruising, bleeding, and enlarged lymph nodes.  Physical Exam: BP 114/74   Pulse (!) 56   Temp 98.1 F (36.7 C) (Oral)   Ht 5' 8" (1.727 m)   Wt 154 lb 12.8 oz (70.2 kg)   BMI 23.54 kg/m  General:   Alert and oriented. Pleasant and cooperative. Well-nourished and well-developed.  Head:  Normocephalic and atraumatic. Eyes:  Without icterus, sclera clear and conjunctiva pink.  Ears:    Normal auditory acuity. Nose:  No deformity, discharge,  or lesions. Lungs:  Clear to auscultation bilaterally. No wheezes, rales, or rhonchi. No distress.  Heart:  S1, S2 present with questionable systolic murmur  Abdomen:  +BS, soft, non-tender and non-distended. No HSM noted. Query umbilical hernia  Rectal:  Deferred  Msk:  Symmetrical without gross deformities. Normal posture. Extremities:  Without edema. Neurologic:  Alert and  oriented x4;  grossly normal neurologically. Psych:  Alert and cooperative. Normal mood and affect.   

## 2016-09-15 NOTE — Assessment & Plan Note (Signed)
Elevation in transaminases noted. Request ultrasound from [facility]. Discussed decreasing ETOH intake. Will recheck HFP in 3 months. Discussed dietary/behavior modification. Further recommendations to follow.

## 2016-09-15 NOTE — Patient Instructions (Signed)
Continue this second month of Uceris.  When you get your last prescription (the third month), take it every other day for 2 weeks then stop.  I will see you in 3 months!

## 2016-09-15 NOTE — Assessment & Plan Note (Addendum)
Doing well with Uceris. Continue 2nd month of treatment and then will taper off over a 2 week period. Discussed with pharmacy tapering dose. Return in 3 months. Discussed avoidance of NSAIDs.

## 2016-09-16 NOTE — Progress Notes (Signed)
cc'ed to pcp °

## 2016-09-25 ENCOUNTER — Other Ambulatory Visit: Payer: Self-pay | Admitting: Cardiology

## 2016-09-29 ENCOUNTER — Ambulatory Visit: Payer: Medicare Other | Admitting: Gastroenterology

## 2016-10-20 DIAGNOSIS — Z880 Allergy status to penicillin: Secondary | ICD-10-CM | POA: Diagnosis not present

## 2016-10-20 DIAGNOSIS — Z79899 Other long term (current) drug therapy: Secondary | ICD-10-CM | POA: Diagnosis not present

## 2016-10-20 DIAGNOSIS — M1A0411 Idiopathic chronic gout, right hand, with tophus (tophi): Secondary | ICD-10-CM | POA: Diagnosis not present

## 2016-10-20 DIAGNOSIS — L438 Other lichen planus: Secondary | ICD-10-CM | POA: Diagnosis not present

## 2016-10-20 DIAGNOSIS — Z5181 Encounter for therapeutic drug level monitoring: Secondary | ICD-10-CM | POA: Diagnosis not present

## 2016-10-20 DIAGNOSIS — Z888 Allergy status to other drugs, medicaments and biological substances status: Secondary | ICD-10-CM | POA: Diagnosis not present

## 2016-10-20 DIAGNOSIS — L820 Inflamed seborrheic keratosis: Secondary | ICD-10-CM | POA: Diagnosis not present

## 2016-10-20 DIAGNOSIS — Z881 Allergy status to other antibiotic agents status: Secondary | ICD-10-CM | POA: Diagnosis not present

## 2016-10-22 DIAGNOSIS — H04123 Dry eye syndrome of bilateral lacrimal glands: Secondary | ICD-10-CM | POA: Diagnosis not present

## 2016-10-22 DIAGNOSIS — Z961 Presence of intraocular lens: Secondary | ICD-10-CM | POA: Diagnosis not present

## 2016-10-22 DIAGNOSIS — H35373 Puckering of macula, bilateral: Secondary | ICD-10-CM | POA: Diagnosis not present

## 2016-10-27 DIAGNOSIS — E781 Pure hyperglyceridemia: Secondary | ICD-10-CM | POA: Diagnosis not present

## 2016-10-27 DIAGNOSIS — E782 Mixed hyperlipidemia: Secondary | ICD-10-CM | POA: Diagnosis not present

## 2016-10-27 DIAGNOSIS — E7800 Pure hypercholesterolemia, unspecified: Secondary | ICD-10-CM | POA: Diagnosis not present

## 2016-10-27 DIAGNOSIS — E8881 Metabolic syndrome: Secondary | ICD-10-CM | POA: Diagnosis not present

## 2016-10-27 DIAGNOSIS — R7301 Impaired fasting glucose: Secondary | ICD-10-CM | POA: Diagnosis not present

## 2016-10-29 DIAGNOSIS — Z6825 Body mass index (BMI) 25.0-25.9, adult: Secondary | ICD-10-CM | POA: Diagnosis not present

## 2016-10-29 DIAGNOSIS — Z23 Encounter for immunization: Secondary | ICD-10-CM | POA: Diagnosis not present

## 2016-10-29 DIAGNOSIS — K210 Gastro-esophageal reflux disease with esophagitis: Secondary | ICD-10-CM | POA: Diagnosis not present

## 2016-10-29 DIAGNOSIS — I10 Essential (primary) hypertension: Secondary | ICD-10-CM | POA: Diagnosis not present

## 2016-10-29 DIAGNOSIS — G4733 Obstructive sleep apnea (adult) (pediatric): Secondary | ICD-10-CM | POA: Diagnosis not present

## 2016-10-29 DIAGNOSIS — M109 Gout, unspecified: Secondary | ICD-10-CM | POA: Diagnosis not present

## 2016-10-29 DIAGNOSIS — M069 Rheumatoid arthritis, unspecified: Secondary | ICD-10-CM | POA: Diagnosis not present

## 2016-10-29 DIAGNOSIS — E782 Mixed hyperlipidemia: Secondary | ICD-10-CM | POA: Diagnosis not present

## 2016-10-29 NOTE — Progress Notes (Signed)
Cardiology Office Note  Date: 10/31/2016   ID: ALEXI HERRERO, DOB 06-09-1936, MRN [id number]  PCP: [name], MD  Primary Cardiologist: Rozann Lesches, MD   Chief Complaint  Patient presents with  . Coronary Artery Disease    History of Present Illness: Kevin Mathis is a 80 y.o. male last seen in April. He presents for a routine follow-up visit. Interval records reviewed. He tells me that he was experiencing substantial weight loss and loose stools, ultimately underwent GI evaluation by Dr. Britta Mccreedy in [place], and was diagnosed with collagenous colitis by biopsy. He has now transitioned care to Dr. Gala Romney in [place]. Doing better overall, but still has some trouble with loose stools. He has gained back about 10 pounds, but lost a total of 30.  From a cardiac perspective he reports occasional feeling of chest tightness, generally very brief and does not require nitroglycerin. I reviewed his medications, he continues on aspirin, Norvasc, Lopressor, Lovaza, and as needed nitroglycerin.  I reviewed his most recent lab work which is outlined below.  He did undergo follow-up ischemic testing last year.  Past Medical History:  Diagnosis Date  . Collagenous colitis   . Coronary atherosclerosis of native coronary artery    DES RCA 5/11, LVEF 55%  . Essential hypertension, benign   . GERD (gastroesophageal reflux disease)   . Hiatal hernia   . Mixed hyperlipidemia   . Obstructive sleep apnea   . Prostate cancer ([place])   . Pulmonary nodules   . Rheumatoid arthritis [facility])     Past Surgical History:  Procedure Laterality Date  . APPENDECTOMY    . COLONOSCOPY  06/2016   Dr. Britta Mccreedy: mild diverticulosis in left colon, sessile polyp 3-7 mm in distal descending colon, biopsies of colon to assess for microscopic colitis. path: collagenous colitis. colon polyp was benign polypoid colorectal mucosa   . COLONOSCOPY  04/2014   Dr. Britta Mccreedy: office notes stated 3 tubular adenomas and  diverticulosis of left colon   . CORONARY ANGIOPLASTY WITH STENT PLACEMENT  2011  . ESOPHAGOGASTRODUODENOSCOPY  06/2016   Dr. Britta Mccreedy: gastritis, path: chronic active gastritis, negative H.pylori   . HEMORRHOID SURGERY    . LAPAROSCOPIC CHOLECYSTECTOMY    . PROSTATECTOMY    . Skin cancer resection      Current Outpatient Prescriptions  Medication Sig Dispense Refill  . amLODipine (NORVASC) 5 MG tablet Take 1 tablet (5 mg total) by mouth daily. 90 tablet 3  . aspirin 81 MG tablet Take 1 tablet (81 mg total) by mouth every other day. (Patient taking differently: Take 81 mg by mouth daily. ) 30 tablet 3  . cevimeline (EVOXAC) 30 MG capsule Take 30 mg by mouth 2 (two) times daily. May add 1 tab at bedtime as needed.    Marland Kitchen CINNAMON PO Take 3,000 mg by mouth daily. Reported on 06/13/2016    . clobetasol (TEMOVATE) 0.05 % GEL Apply topically as needed.    . colchicine 0.6 MG tablet Take 0.6 mg by mouth 2 (two) times daily.      Marland Kitchen esomeprazole (NEXIUM) 40 MG capsule Take 40 mg by mouth daily.     Marland Kitchen gabapentin (NEURONTIN) 300 MG capsule Take 1 capsule (300 mg total) by mouth 2 (two) times daily. Take 1 around 6pm and 2 at bedtime 270 capsule 3  . metoprolol tartrate (LOPRESSOR) 25 MG tablet TAKE ONE-HALF (1/2) TABLET TWICE A DAY 90 tablet 3  . Multiple Vitamin (MULTIVITAMIN) tablet Take 1 tablet by  mouth daily. Reported on 06/13/2016    . nitroGLYCERIN (NITROSTAT) 0.4 MG SL tablet Place 1 tablet (0.4 mg total) under the tongue every 5 (five) minutes as needed. 25 tablet 3  . NON FORMULARY CPAP Use as directed     . omega-3 acid ethyl esters (LOVAZA) 1 g capsule TAKE 2 CAPSULES TWICE A DAY 360 capsule 3  . Probiotic Product (PROBIOTIC DAILY PO) Take 1 Can by mouth daily. Reported on 06/13/2016    . Thiamine HCl (VITAMIN B-1) 250 MG tablet Take 250 mg by mouth daily. Reported on 06/13/2016    . vitamin B-12 (CYANOCOBALAMIN) 1000 MCG tablet Take 1,000 mcg by mouth daily. Reported on 06/13/2016    . zinc  gluconate 50 MG tablet Take 50 mg by mouth 2 (two) times daily.     . Cholecalciferol (VITAMIN D) 2000 units CAPS Take 1 capsule by mouth daily. Reported on 06/13/2016     No current facility-administered medications for this visit.    Allergies:  Cefprozil; Levofloxacin; Penicillins; and Statins   Social History: The patient  reports that he quit smoking about 24 years ago. His smoking use included Cigarettes. He started smoking about 64 years ago. He has a 40.00 pack-year smoking history. He has never used smokeless tobacco. He reports that he drinks alcohol. He reports that he does not use drugs.   ROS:  Please see the history of present illness. Otherwise, complete review of systems is positive for relative anorexia and intermittent loose stools.  All other systems are reviewed and negative.   Physical Exam: VS:  BP 106/62   Pulse 66   Ht 5\' 8"  (1.727 m)   Wt 157 lb (71.2 kg)   SpO2 97%   BMI 23.87 kg/m , BMI Body mass index is 23.87 kg/m.  Wt Readings from Last 3 Encounters:  10/31/16 157 lb (71.2 kg)  09/15/16 154 lb 12.8 oz (70.2 kg)  04/07/16 166 lb (75.3 kg)    General: Chronically ill-appearing male, no distress. HEENT: Conjunctiva and lids normal, oropharynx clear. Neck: Supple, no elevated JVP or carotid bruits, no thyromegaly. Lungs: Clear to auscultation, nonlabored breathing at rest. Cardiac: Regular rate and rhythm, no S3 or significant systolic murmur, no pericardial rub. Abdomen: Soft, nontender, bowel sounds present, no guarding or rebound. Extremities: No pitting edema, distal pulses 2+. Skin: Warm and dry. Musculoskeletal: No kyphosis. Neuropsychiatric: Alert and oriented x3, affect grossly appropriate.  ECG: I personally reviewed the tracing from 04/07/2016 which showed sinus bradycardia.  Recent Labwork:  March 2017: Cholesterol 127, triglycerides 181, HDL 26, LDL 65 October 2017: Cholesterol 138, triglycerides 147, HDL 26, LDL 83, BUN 11, creatinine  0.9, potassium 4.5, AST 48, ALT 41, hemoglobin 11.9, platelets 172  Other Studies Reviewed Today:  Exercise Cardiolite 04/24/2015: FINDINGS: Exercise stress  Baseline EKG shows normal sinus rhythm. The patient was stressed according to the Bruce protocol for 7 minutes and 1 second achieving a work level of 8.30 Mets. The resting heart rate is 60 beats per minute rose to a maximum of 134 beats per minute, representing 93% of the maximal age predicted heart rate. The resting blood pressure of 148/87 increased to a maximum of 189/93. The test was stopped due to fatigue, the patient did not experience any chest pain. Stress EKG showed no specific ischemic changes and no significant arrhythmias.  Perfusion: There is a medium sized moderate intensity fixed inferior wall defect. The inferior wall has normal wall motion. Findings are most consistent with sub-  diaphragmatic attenuation, however cannot rule out prior inferior infarct.  Wall Motion: Normal left ventricular wall motion. No left ventricular dilation.  Left Ventricular Ejection Fraction: 76 %  End diastolic volume 64 ml  End systolic volume 15 ml  IMPRESSION: 1. Medium size moderate intensity inferior wall defect. Normal wall motion would support probable sub- diaphragmatic attenuation, however cannot rule out prior inferior infarct given his history of RCA disease. If true prior infarct there is no peri-infarct ischemia.  2. Normal left ventricular wall motion.  3. Left ventricular ejection fraction 76%  4. Low-risk stress test findings*. Duke treadmill score of 7 supports low risk for major cardiac events.  Assessment and Plan:  1. CAD status post DES to the RCA in 2011 with overall low risk Myoview study from last year. He does report intermittent angina symptoms, although no progression and generally brief. We will continue with observation for now. Refills provided for nitroglycerin and  Lopressor.  2. Essential hypertension, remains on Norvasc. Blood pressure is well controlled today.  3. Hyperlipidemia, recent LDL 83.  4. Interval diagnosis of collagenous colitis, now being followed by Dr. Gala Romney.  Current medicines were reviewed with the patient today.  Disposition: Follow-up in 6 months.  Signed, Satira Sark, MD, [facility] 10/31/2016 9:54 AM    [facility] [place], [place], [place] [address] Phone: [phone]; Fax: [phone]

## 2016-10-31 ENCOUNTER — Ambulatory Visit (INDEPENDENT_AMBULATORY_CARE_PROVIDER_SITE_OTHER): Payer: Medicare Other | Admitting: Cardiology

## 2016-10-31 ENCOUNTER — Encounter: Payer: Self-pay | Admitting: Cardiology

## 2016-10-31 VITALS — BP 106/62 | HR 66 | Ht 68.0 in | Wt 157.0 lb

## 2016-10-31 DIAGNOSIS — I25119 Atherosclerotic heart disease of native coronary artery with unspecified angina pectoris: Secondary | ICD-10-CM | POA: Diagnosis not present

## 2016-10-31 DIAGNOSIS — E782 Mixed hyperlipidemia: Secondary | ICD-10-CM

## 2016-10-31 DIAGNOSIS — K52831 Collagenous colitis: Secondary | ICD-10-CM

## 2016-10-31 DIAGNOSIS — I209 Angina pectoris, unspecified: Secondary | ICD-10-CM | POA: Diagnosis not present

## 2016-10-31 DIAGNOSIS — I10 Essential (primary) hypertension: Secondary | ICD-10-CM

## 2016-10-31 MED ORDER — NITROGLYCERIN 0.4 MG SL SUBL: 0.4000 mg | SUBLINGUAL_TABLET | SUBLINGUAL | 3 refills | Status: DC | PRN

## 2016-10-31 MED ORDER — METOPROLOL TARTRATE 25 MG PO TABS: ORAL_TABLET | ORAL | 3 refills | Status: DC

## 2016-10-31 NOTE — Patient Instructions (Signed)

## 2016-11-26 ENCOUNTER — Other Ambulatory Visit: Payer: Self-pay | Admitting: Cardiology

## 2016-12-15 ENCOUNTER — Encounter: Payer: Self-pay | Admitting: Gastroenterology

## 2016-12-15 ENCOUNTER — Ambulatory Visit (INDEPENDENT_AMBULATORY_CARE_PROVIDER_SITE_OTHER): Payer: Medicare Other | Admitting: Gastroenterology

## 2016-12-15 VITALS — BP 122/84 | HR 63 | Temp 97.6°F | Ht 67.0 in | Wt 155.2 lb

## 2016-12-15 DIAGNOSIS — K52831 Collagenous colitis: Secondary | ICD-10-CM | POA: Diagnosis not present

## 2016-12-15 DIAGNOSIS — K769 Liver disease, unspecified: Secondary | ICD-10-CM

## 2016-12-15 DIAGNOSIS — R634 Abnormal weight loss: Secondary | ICD-10-CM | POA: Diagnosis not present

## 2016-12-15 DIAGNOSIS — R945 Abnormal results of liver function studies: Principal | ICD-10-CM

## 2016-12-15 DIAGNOSIS — I25119 Atherosclerotic heart disease of native coronary artery with unspecified angina pectoris: Secondary | ICD-10-CM | POA: Diagnosis not present

## 2016-12-15 DIAGNOSIS — R7989 Other specified abnormal findings of blood chemistry: Secondary | ICD-10-CM

## 2016-12-15 NOTE — Progress Notes (Signed)
Referring Provider: [name], MD Primary Care Physician:  [name], MD  Primary GI: Dr. Gala Romney    Chief Complaint  Patient presents with  . Diarrhea    2 loose stool today, stopped Uceris and Cholestyramine    HPI:   Kevin Mathis is a 80 y.o. male presenting today with a history of collagenous colitis in July 2017, completing course of Uceris and doing well in Sept 2017 at visit. His baseline habits are 1-2 stools per day. Weight stable from last visit. Outside labs reviewed from July 2017 with AST 88 and ALT 75, Tbili 0.6, Alk Phos 108, Albumin 4.2, Platelets mildly decreased at 132, Hgb normal at 13. Wife states an [facility] at the [facility], which will be requested. Patient was drinking about 4 beers a day. US abdomen Aug 2017 with hepatomegaly, normal sized spleen. Hep C antibody negative on outside labs. Ceruloplasmin normal at 27, ANA negative.   Aug 2017: Alk Phos 129, AST 68, ALT 81 Aug 2017. Iron 123, sats 30%, ferritin 105. Hgb 14.4, platelets 131. Down to 1 beer a day.    Tapered off of Uceris. Had one bad flare up after tapering off but has resolved. Was taking Questran BID but then got constipated, so he has stopped that as well. None in 2 weeks. This morning had 2 loose stool but feels fine. Itching chronically for years, which he attributes to dry skin. Sees a dermatologist. Itching worse recently.   Outside CT by Dr. Britta Mccreedy dated July 02, 2016 notes subcapsular fat density lesion within the hepatic dome measuring 2.3 cm felt likely to be an angiomyolipoma, pancreas with mild atrophy, spleen with old granulomatous disease, hypo-attenuating splenic lesion well-circumscribed and favored to represent a cyst or minimally complex cyst   Past Medical History:  Diagnosis Date  . Collagenous colitis   . Coronary atherosclerosis of native coronary artery    DES RCA 5/11, LVEF 55%  . Essential hypertension, benign   . GERD (gastroesophageal reflux  disease)   . Hiatal hernia   . Mixed hyperlipidemia   . Obstructive sleep apnea   . Prostate cancer ([place])   . Pulmonary nodules   . Rheumatoid arthritis [facility])     Past Surgical History:  Procedure Laterality Date  . APPENDECTOMY    . COLONOSCOPY  06/2016   Dr. Britta Mccreedy: mild diverticulosis in left colon, sessile polyp 3-7 mm in distal descending colon, biopsies of colon to assess for microscopic colitis. path: collagenous colitis. colon polyp was benign polypoid colorectal mucosa   . COLONOSCOPY  04/2014   Dr. Britta Mccreedy: office notes stated 3 tubular adenomas and diverticulosis of left colon   . CORONARY ANGIOPLASTY WITH STENT PLACEMENT  2011  . ESOPHAGOGASTRODUODENOSCOPY  06/2016   Dr. Britta Mccreedy: gastritis, path: chronic active gastritis, negative H.pylori   . HEMORRHOID SURGERY    . LAPAROSCOPIC CHOLECYSTECTOMY    . PROSTATECTOMY    . Skin cancer resection      Current Outpatient Prescriptions  Medication Sig Dispense Refill  . amLODipine (NORVASC) 5 MG tablet TAKE 1 TABLET DAILY 90 tablet 3  . aspirin 81 MG tablet Take 1 tablet (81 mg total) by mouth every other day. (Patient taking differently: Take 81 mg by mouth daily. ) 30 tablet 3  . cevimeline (EVOXAC) 30 MG capsule Take 30 mg by mouth 2 (two) times daily. May add 1 tab at bedtime as needed.    . Cholecalciferol (VITAMIN D) 2000 units CAPS  Take 1 capsule by mouth daily. Reported on 06/13/2016    . Cholestyramine POWD Take by mouth as needed.    Marland Kitchen CINNAMON PO Take 3,000 mg by mouth daily. Reported on 06/13/2016    . clobetasol (TEMOVATE) 0.05 % GEL Apply topically as needed.    . colchicine 0.6 MG tablet Take 0.6 mg by mouth 2 (two) times daily.      Marland Kitchen esomeprazole (NEXIUM) 40 MG capsule Take 40 mg by mouth daily.     Marland Kitchen gabapentin (NEURONTIN) 300 MG capsule Take 1 capsule (300 mg total) by mouth 2 (two) times daily. Take 1 around 6pm and 2 at bedtime 270 capsule 3  . metoprolol tartrate (LOPRESSOR) 25 MG tablet TAKE ONE-HALF (1/2)  TABLET TWICE A DAY 90 tablet 3  . Multiple Vitamin (MULTIVITAMIN) tablet Take 1 tablet by mouth daily. Reported on 06/13/2016    . nitroGLYCERIN (NITROSTAT) 0.4 MG SL tablet Place 1 tablet (0.4 mg total) under the tongue every 5 (five) minutes as needed. 25 tablet 3  . NON FORMULARY CPAP Use as directed     . omega-3 acid ethyl esters (LOVAZA) 1 g capsule TAKE 2 CAPSULES TWICE A DAY 360 capsule 3  . Probiotic Product (PROBIOTIC DAILY PO) Take 1 Can by mouth daily. Reported on 06/13/2016    . Thiamine HCl (VITAMIN B-1) 250 MG tablet Take 250 mg by mouth daily. Reported on 06/13/2016    . vitamin B-12 (CYANOCOBALAMIN) 1000 MCG tablet Take 1,000 mcg by mouth daily. Reported on 06/13/2016    . zinc gluconate 50 MG tablet Take 50 mg by mouth 2 (two) times daily.      No current facility-administered medications for this visit.     Allergies as of 12/15/2016 - Review Complete 12/15/2016  Allergen Reaction Noted  . Cefprozil  04/17/2015  . Levofloxacin Rash   . Penicillins Rash   . Statins Other (See Comments) and Rash 11/05/2011    Family History  Problem Relation Age of Onset  . Colon cancer Mother 52  . Hypertension      Social History   Social History  . Marital status: Married    Spouse name: N/A  . Number of children: N/A  . Years of education: N/A   Social History Main Topics  . Smoking status: Former Smoker    Packs/day: 1.00    Years: 40.00    Types: Cigarettes    Start date: 12/30/1951    Quit date: 12/30/1991  . Smokeless tobacco: Never Used  . Alcohol use 0.0 oz/week     Comment: 1 happy hour beer a day   . Drug use: No  . Sexual activity: Not Asked   Other Topics Concern  . None   Social History Narrative  . None    Review of Systems: As mentioned in HPI   Physical Exam: BP 122/84   Pulse 63   Temp 97.6 F (36.4 C) (Oral)   Ht 5' 7"  (1.702 m)   Wt 155 lb 3.2 oz (70.4 kg)   BMI 24.31 kg/m  General:   Alert and oriented. No distress noted. Pleasant and  cooperative.  Head:  Normocephalic and atraumatic. Eyes:  Conjuctiva clear without scleral icterus. Mouth:  Oral mucosa pink and moist. Good dentition. No lesions. Abdomen:  +BS, soft, non-tender and non-distended. No rebound or guarding. Liver palpable below right costal margin, smooth. Umbilical hernia noted.  Msk:  Moderate kyphosis  Extremities:  Without edema. Neurologic:  Alert and  oriented  x4 Psych:  Alert and cooperative. Normal mood and affect.

## 2016-12-15 NOTE — Assessment & Plan Note (Signed)
Has stabilized since last visit. Query multifactorial. CT without obvious malignancy in July 2017. Close follow-up in 6-8 weeks.

## 2016-12-15 NOTE — Patient Instructions (Signed)
Please complete the labs today.   We have scheduled an elastography for you in the near future.  We will see you in 6-8 weeks!

## 2016-12-15 NOTE — Assessment & Plan Note (Signed)
Off of Uceris and back to baseline. Query if he may also have underlying pancreatic insufficiency playing a role. May need to trial pancreatic enzymes if persistent weight loss.

## 2016-12-15 NOTE — Assessment & Plan Note (Signed)
In setting or prior daily ETOH (several beers a day), now decreasing intake to approximately one. Thus far, serologies to include ceruloplasmin, ANA, Hep C negative. Needs more extensive testing due to persistent elevated transaminases, alk phos. With mild thrombocytopenia, concern for underlying liver disease remains but no evidence of advanced liver disease on EGD by Dr. Britta Mccreedy earlier this year. Extensive serologies ordered today, along with elastography. As of note, normal spleen on outside US abdomen; CT from July 02, 2016 also noted a likely angiomyolipoma of liver. May need further imaging.

## 2016-12-16 LAB — TSH: TSH: 0.58 m[IU]/L (ref 0.40–4.50)

## 2016-12-16 LAB — HEPATIC FUNCTION PANEL
ALT: 72 U/L — ABNORMAL HIGH (ref 9–46)
AST: 94 U/L — ABNORMAL HIGH (ref 10–35)
Albumin: 4.2 g/dL (ref 3.6–5.1)
Alkaline Phosphatase: 92 U/L (ref 40–115)
BILIRUBIN DIRECT: 0.2 mg/dL (ref <=0.2)
BILIRUBIN INDIRECT: 0.6 mg/dL (ref 0.2–1.2)
TOTAL PROTEIN: 6.8 g/dL (ref 6.1–8.1)
Total Bilirubin: 0.8 mg/dL (ref 0.2–1.2)

## 2016-12-16 LAB — IGG, IGA, IGM
IgA: 400 mg/dL (ref 81–463)
IgG (Immunoglobin G), Serum: 902 mg/dL (ref 694–1618)
IgM, Serum: 206 mg/dL (ref 48–271)

## 2016-12-16 LAB — PROTIME-INR
INR: 1
Prothrombin Time: 11.1 s (ref 9.0–11.5)

## 2016-12-16 LAB — TISSUE TRANSGLUTAMINASE, IGA: TISSUE TRANSGLUTAMINASE AB, IGA: 2 U/mL (ref <4)

## 2016-12-16 LAB — HEPATITIS B SURFACE ANTIGEN: HEP B S AG: NEGATIVE

## 2016-12-16 LAB — IGA: IgA: 400 mg/dL (ref 81–463)

## 2016-12-16 LAB — ANTI-SMOOTH MUSCLE ANTIBODY, IGG: Smooth Muscle Ab: <20 U (ref <20)

## 2016-12-16 NOTE — Progress Notes (Signed)
cc'ed to pcp °

## 2016-12-17 LAB — MITOCHONDRIAL ANTIBODIES

## 2016-12-18 ENCOUNTER — Telehealth: Payer: Self-pay | Admitting: Internal Medicine

## 2016-12-18 MED ORDER — CHOLESTYRAMINE 4 GM/DOSE PO POWD: 2.0000 g | Freq: Every day | ORAL | 12 refills | Status: DC

## 2016-12-18 NOTE — Telephone Encounter (Signed)
Pt left a voicemail- he said he would like to continue taking the cholestyramine and he is asking for an rx [facility] to [facility].

## 2016-12-18 NOTE — Telephone Encounter (Signed)
Done

## 2016-12-18 NOTE — Telephone Encounter (Signed)
Pt called asking to speak with AB. Patient was seen on Monday. I told him AB was seeing patients and I could take a message or transfer him to the nurse's VM. He asked to be switched to VM.

## 2016-12-20 LAB — ALPHA-1 ANTITRYPSIN PHENOTYPE: A-1 Antitrypsin: 170 mg/dL (ref 83–199)

## 2016-12-25 ENCOUNTER — Encounter: Payer: Self-pay | Admitting: Internal Medicine

## 2016-12-25 ENCOUNTER — Ambulatory Visit (HOSPITAL_COMMUNITY)
Admission: RE | Admit: 2016-12-25 | Discharge: 2016-12-25 | Disposition: A | Discharge status: Home / Self Care | Payer: Medicare Other | Source: Ambulatory Visit | Attending: Gastroenterology | Admitting: Gastroenterology

## 2016-12-25 DIAGNOSIS — Z9049 Acquired absence of other specified parts of digestive tract: Secondary | ICD-10-CM | POA: Insufficient documentation

## 2016-12-25 DIAGNOSIS — R945 Abnormal results of liver function studies: Secondary | ICD-10-CM

## 2016-12-25 DIAGNOSIS — R7989 Other specified abnormal findings of blood chemistry: Secondary | ICD-10-CM | POA: Diagnosis not present

## 2016-12-26 NOTE — Progress Notes (Signed)
Elastography with score of F2/F3. No evidence of cirrhosis but likely fibrosis. Applauded on efforts of cutting down ETOH but would eliminate completely. All of his labs are good except for AST/ALT 94 and 72 respectively. Work-up for liver disease thus far negative. Question ETOH-related. Needs to eliminate entirely and recheck in 3 months. Would also recommend more dedicated imaging of the liver lesion which appeared to be a fat density lesion. We could pursue MRI with [facility] to further characterize if he is willing. Return to see me in 3 months.

## 2017-01-01 DIAGNOSIS — J019 Acute sinusitis, unspecified: Secondary | ICD-10-CM | POA: Diagnosis not present

## 2017-01-01 DIAGNOSIS — Z6824 Body mass index (BMI) 24.0-24.9, adult: Secondary | ICD-10-CM | POA: Diagnosis not present

## 2017-01-02 ENCOUNTER — Other Ambulatory Visit: Payer: Self-pay | Admitting: Gastroenterology

## 2017-01-02 MED ORDER — CHOLESTYRAMINE POWD: 4.0000 g | Freq: Two times a day (BID) | 0 refills | Status: DC

## 2017-01-05 ENCOUNTER — Other Ambulatory Visit: Payer: Self-pay | Admitting: Gastroenterology

## 2017-01-05 ENCOUNTER — Other Ambulatory Visit: Payer: Self-pay

## 2017-01-05 DIAGNOSIS — R945 Abnormal results of liver function studies: Principal | ICD-10-CM

## 2017-01-05 DIAGNOSIS — R7989 Other specified abnormal findings of blood chemistry: Secondary | ICD-10-CM

## 2017-01-05 DIAGNOSIS — K769 Liver disease, unspecified: Secondary | ICD-10-CM

## 2017-01-12 ENCOUNTER — Ambulatory Visit (HOSPITAL_COMMUNITY)
Admission: RE | Admit: 2017-01-12 | Discharge: 2017-01-12 | Disposition: A | Discharge status: Home / Self Care | Payer: Medicare Other | Source: Ambulatory Visit | Attending: Gastroenterology | Admitting: Gastroenterology

## 2017-01-12 ENCOUNTER — Telehealth: Payer: Self-pay | Admitting: Internal Medicine

## 2017-01-12 ENCOUNTER — Encounter (INDEPENDENT_AMBULATORY_CARE_PROVIDER_SITE_OTHER): Payer: Self-pay

## 2017-01-12 DIAGNOSIS — K760 Fatty (change of) liver, not elsewhere classified: Secondary | ICD-10-CM | POA: Diagnosis not present

## 2017-01-12 DIAGNOSIS — I700 Atherosclerosis of aorta: Secondary | ICD-10-CM | POA: Insufficient documentation

## 2017-01-12 DIAGNOSIS — Z9049 Acquired absence of other specified parts of digestive tract: Secondary | ICD-10-CM | POA: Insufficient documentation

## 2017-01-12 DIAGNOSIS — R634 Abnormal weight loss: Secondary | ICD-10-CM | POA: Diagnosis not present

## 2017-01-12 DIAGNOSIS — K769 Liver disease, unspecified: Secondary | ICD-10-CM | POA: Insufficient documentation

## 2017-01-12 LAB — POCT I-STAT CREATININE: CREATININE: 1 mg/dL (ref 0.61–1.24)

## 2017-01-12 MED ORDER — GADOXETATE DISODIUM 0.25 MMOL/ML IV SOLN
7.0000 mL | Freq: Once | INTRAVENOUS | Status: AC | PRN
  Administered 2017-01-12: 7 mL via INTRAVENOUS

## 2017-01-12 MED ORDER — SODIUM CHLORIDE 0.9% FLUSH
INTRAVENOUS | Status: AC
  Filled 2017-01-12: qty 100

## 2017-01-12 NOTE — Telephone Encounter (Signed)
PATIENT HAS A PRESCRIPTION FOR BUDESONIDE THAT WAS GIVEN BY DR Britta Mccreedy AND HE WANTS TO KNOW IF ANNA CAN CALL IN A SCRIPT FOR HIM TO [facility] BEFORE HIS UPCOMING OFFICE VISIT SO HE WILL NOT RUN OUT

## 2017-01-12 NOTE — Telephone Encounter (Signed)
Tried to call pt to get more information. NA- LMOM 

## 2017-01-13 DIAGNOSIS — J320 Chronic maxillary sinusitis: Secondary | ICD-10-CM | POA: Diagnosis not present

## 2017-01-13 NOTE — Progress Notes (Signed)
Fat containing lesion appears benign. 3 month return to see me as planned.

## 2017-01-19 DIAGNOSIS — J328 Other chronic sinusitis: Secondary | ICD-10-CM | POA: Diagnosis not present

## 2017-01-19 DIAGNOSIS — J320 Chronic maxillary sinusitis: Secondary | ICD-10-CM | POA: Diagnosis not present

## 2017-01-20 NOTE — Telephone Encounter (Signed)
Pt has ov tomorrow with AB.

## 2017-01-21 ENCOUNTER — Ambulatory Visit (INDEPENDENT_AMBULATORY_CARE_PROVIDER_SITE_OTHER): Payer: Medicare Other | Admitting: Gastroenterology

## 2017-01-21 ENCOUNTER — Encounter: Payer: Self-pay | Admitting: Gastroenterology

## 2017-01-21 VITALS — BP 92/60 | HR 72 | Temp 98.0°F | Ht 68.0 in | Wt 150.4 lb

## 2017-01-21 DIAGNOSIS — R634 Abnormal weight loss: Secondary | ICD-10-CM | POA: Diagnosis not present

## 2017-01-21 NOTE — Progress Notes (Signed)
Referring Provider: [name], MD Primary Care Physician:  [name], MD Primary GI: Dr. Gala Romney   Chief Complaint  Patient presents with  . Diarrhea    HPI:   Kevin Mathis is a 81 y.o. male presenting today with a history of collagenous colitis in July 2017, completing course of Uceris. Last seen Dec 2017. Baseline stool habits are 1-2 loose stool per day. Outside CT by Dr. 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 MRI Jan 2018 to further assess liver lesion and noted benign appearance. US abdomen with elastography performed with F2/F3 score, moderately elevated transaminases with evaluation negative thus far and felt to be ETOH-related. Celiac serologies negative. TSH normal. He has lost an additional 5 lbs since last being seen. No anemia on last CBC in Aug 2017.   Still loose stool at times. Took powder twice a day for about 3 days and felt constipated. Will just take as needed and will be ok. Stool is soft serve. Stools about 2 a day.   Appetite is decreased. Can't taste as well. Hasn't been able to taste or smell since 1993. In past hasn't been able to tell if sweet or salty. Mouth feels dry. Eats a lot of ice cream.   Past Medical History:  Diagnosis Date  . Collagenous colitis   . Coronary atherosclerosis of native coronary artery    DES RCA 5/11, LVEF 55%  . Essential hypertension, benign   . GERD (gastroesophageal reflux disease)   . Hiatal hernia   . Mixed hyperlipidemia   . Obstructive sleep apnea   . Prostate cancer ([place])   . Pulmonary nodules   . Rheumatoid arthritis [facility])     Past Surgical History:  Procedure Laterality Date  . APPENDECTOMY    . COLONOSCOPY  06/2016   Dr. Britta Mccreedy:  mild diverticulosis in left colon, sessile polyp 3-7 mm in distal descending colon, biopsies of colon to assess for microscopic colitis. path: collagenous colitis. colon polyp was benign polypoid colorectal mucosa   . COLONOSCOPY  04/2014   Dr. Britta Mccreedy: office notes stated 3 tubular adenomas and diverticulosis of left colon   . CORONARY ANGIOPLASTY WITH STENT PLACEMENT  2011  . ESOPHAGOGASTRODUODENOSCOPY  06/2016   Dr. Britta Mccreedy: gastritis, path: chronic active gastritis, negative H.pylori   . HEMORRHOID SURGERY    . LAPAROSCOPIC CHOLECYSTECTOMY    . PROSTATECTOMY    . Skin cancer resection      Current Outpatient Prescriptions  Medication Sig Dispense Refill  . amLODipine (NORVASC) 5 MG tablet TAKE 1 TABLET DAILY 90 tablet 3  . aspirin 81 MG tablet Take 1 tablet (81 mg total) by mouth every other day. (Patient taking differently: Take 81 mg by mouth daily. ) 30 tablet 3  . cevimeline (EVOXAC) 30 MG capsule Take 30 mg by mouth 2 (two) times daily. May add 1 tab at bedtime as needed.    . Cholecalciferol (VITAMIN D) 2000 units CAPS Take 1 capsule by mouth daily. Reported on 06/13/2016    . cholestyramine (QUESTRAN) 4 GM/DOSE powder Take 0.5 packets (2 g total) by mouth daily. Do not take with other medications. Take other medications an hour before or 4 hours after 378 g 12  . CINNAMON PO Take 3,000 mg by  mouth daily. Reported on 06/13/2016    . clobetasol (TEMOVATE) 0.05 % GEL Apply topically as needed.    . colchicine 0.6 MG tablet Take 0.6 mg by mouth 2 (two) times daily.      Marland Kitchen esomeprazole (NEXIUM) 40 MG capsule Take 40 mg by mouth daily.     Marland Kitchen gabapentin (NEURONTIN) 300 MG capsule Take 1 capsule (300 mg total) by mouth 2 (two) times daily. Take 1 around 6pm and 2 at bedtime 270 capsule 3  . metoprolol tartrate (LOPRESSOR) 25 MG tablet TAKE ONE-HALF (1/2) TABLET TWICE A DAY 90 tablet 3  . Multiple Vitamin (MULTIVITAMIN) tablet Take 1 tablet by mouth daily. Reported on 06/13/2016    .  nitroGLYCERIN (NITROSTAT) 0.4 MG SL tablet Place 1 tablet (0.4 mg total) under the tongue every 5 (five) minutes as needed. 25 tablet 3  . NON FORMULARY CPAP Use as directed     . omega-3 acid ethyl esters (LOVAZA) 1 g capsule TAKE 2 CAPSULES TWICE A DAY 360 capsule 3  . Probiotic Product (PROBIOTIC DAILY PO) Take 1 Can by mouth daily. Reported on 06/13/2016    . Thiamine HCl (VITAMIN B-1) 250 MG tablet Take 250 mg by mouth daily. Reported on 06/13/2016    . vitamin B-12 (CYANOCOBALAMIN) 1000 MCG tablet Take 1,000 mcg by mouth daily. Reported on 06/13/2016    . zinc gluconate 50 MG tablet Take 50 mg by mouth 2 (two) times daily.     . Cholestyramine POWD Take by mouth as needed.    . Cholestyramine POWD 4 g by Does not apply route 2 (two) times daily. (Patient not taking: Reported on 01/21/2017) 1 Bottle 0   No current facility-administered medications for this visit.     Allergies as of 01/21/2017 - Review Complete 01/21/2017  Allergen Reaction Noted  . Cefprozil  04/17/2015  . Levofloxacin Rash   . Penicillins Rash   . Statins Other (See Comments) and Rash 11/05/2011    Family History  Problem Relation Age of Onset  . Colon cancer Mother 108  . Hypertension      Social History   Social History  . Marital status: Married    Spouse name: N/A  . Number of children: N/A  . Years of education: N/A   Social History Main Topics  . Smoking status: Former Smoker    Packs/day: 1.00    Years: 40.00    Types: Cigarettes    Start date: 12/30/1951    Quit date: 12/30/1991  . Smokeless tobacco: Never Used  . Alcohol use 0.0 oz/week     Comment: 1 happy hour beer a day   . Drug use: No  . Sexual activity: Not Asked   Other Topics Concern  . None   Social History Narrative  . None    Review of Systems: As mentioned in HPI.   Physical Exam: BP 92/60   Pulse 72   Temp 98 F (36.7 C) (Oral)   Ht 5\' 8"  (1.727 m)   Wt 150 lb 6.4 oz (68.2 kg)   BMI 22.87 kg/m  General:   Alert  and oriented. No distress noted. Pleasant and cooperative.  Head:  Normocephalic and atraumatic. Eyes:  Conjuctiva clear without scleral icterus. Mouth:  Oral mucosa pink and moist. Good dentition. No lesions. Abdomen:  +BS, soft, non-tender and non-distended. No rebound or guarding. Umbilical hernia Msk:  Mild to moderate kyphosis  Pulses:  2+ DP noted bilaterally Extremities:  Without edema. Neurologic:  Alert and  oriented x4;  grossly normal neurologically. Psych:  Alert and cooperative. Normal mood and affect.

## 2017-01-21 NOTE — Patient Instructions (Signed)
Please start taking Zenpep 1 capsule with food three times a day.   I would like to see you back in March 2018.  Start taking Boost as a snack mid morning and mid afternoon.   Please call if you continue to lose weight.

## 2017-01-27 ENCOUNTER — Ambulatory Visit: Payer: Medicare Other | Admitting: Gastroenterology

## 2017-01-27 NOTE — Assessment & Plan Note (Signed)
82 year old with slow, progressive weight loss noted. EGD/colonoscopy performed by Dr. Britta Mccreedy in July 2017. Treated for collagenous colitis and back to baseline bowel habits of 1-2 soft stools per day. Imaging (CT and MRI) without obvious source for weight loss. Celiac, TSH normal. Elevated transaminases evaluated thus far and felt to be possibly secondary to ETOH use. Metavir score F2/F3. Unclear why he continues to slowly lose weight. Trial of pancreatic enzymes, Zenpep 40,000 units with meals. Of course, this could be increased in the future, but I have provided samples for now. Add boost/ensure BID. Return in March 2018 for close follow-up. Keep appt with Rheumatology.

## 2017-01-29 NOTE — Progress Notes (Signed)
cc'ed to pcp °

## 2017-02-27 ENCOUNTER — Other Ambulatory Visit: Payer: Self-pay | Admitting: Gastroenterology

## 2017-02-27 DIAGNOSIS — N400 Enlarged prostate without lower urinary tract symptoms: Secondary | ICD-10-CM | POA: Diagnosis not present

## 2017-02-27 DIAGNOSIS — R7301 Impaired fasting glucose: Secondary | ICD-10-CM | POA: Diagnosis not present

## 2017-02-27 DIAGNOSIS — E782 Mixed hyperlipidemia: Secondary | ICD-10-CM | POA: Diagnosis not present

## 2017-02-27 DIAGNOSIS — I10 Essential (primary) hypertension: Secondary | ICD-10-CM | POA: Diagnosis not present

## 2017-02-27 DIAGNOSIS — D519 Vitamin B12 deficiency anemia, unspecified: Secondary | ICD-10-CM | POA: Diagnosis not present

## 2017-02-27 DIAGNOSIS — R7989 Other specified abnormal findings of blood chemistry: Secondary | ICD-10-CM | POA: Diagnosis not present

## 2017-03-02 DIAGNOSIS — E8881 Metabolic syndrome: Secondary | ICD-10-CM | POA: Diagnosis not present

## 2017-03-02 DIAGNOSIS — R7301 Impaired fasting glucose: Secondary | ICD-10-CM | POA: Diagnosis not present

## 2017-03-02 DIAGNOSIS — R197 Diarrhea, unspecified: Secondary | ICD-10-CM | POA: Diagnosis not present

## 2017-03-02 DIAGNOSIS — I10 Essential (primary) hypertension: Secondary | ICD-10-CM | POA: Diagnosis not present

## 2017-03-02 DIAGNOSIS — E782 Mixed hyperlipidemia: Secondary | ICD-10-CM | POA: Diagnosis not present

## 2017-03-02 DIAGNOSIS — G619 Inflammatory polyneuropathy, unspecified: Secondary | ICD-10-CM | POA: Diagnosis not present

## 2017-03-02 DIAGNOSIS — M109 Gout, unspecified: Secondary | ICD-10-CM | POA: Diagnosis not present

## 2017-03-02 DIAGNOSIS — K210 Gastro-esophageal reflux disease with esophagitis: Secondary | ICD-10-CM | POA: Diagnosis not present

## 2017-03-03 DIAGNOSIS — M150 Primary generalized (osteo)arthritis: Secondary | ICD-10-CM | POA: Diagnosis not present

## 2017-03-03 DIAGNOSIS — R768 Other specified abnormal immunological findings in serum: Secondary | ICD-10-CM | POA: Diagnosis not present

## 2017-03-03 DIAGNOSIS — Z6824 Body mass index (BMI) 24.0-24.9, adult: Secondary | ICD-10-CM | POA: Diagnosis not present

## 2017-03-03 DIAGNOSIS — M3500 Sicca syndrome, unspecified: Secondary | ICD-10-CM | POA: Diagnosis not present

## 2017-03-03 DIAGNOSIS — E790 Hyperuricemia without signs of inflammatory arthritis and tophaceous disease: Secondary | ICD-10-CM | POA: Diagnosis not present

## 2017-03-03 DIAGNOSIS — M2550 Pain in unspecified joint: Secondary | ICD-10-CM | POA: Diagnosis not present

## 2017-03-17 DIAGNOSIS — M150 Primary generalized (osteo)arthritis: Secondary | ICD-10-CM | POA: Diagnosis not present

## 2017-03-17 DIAGNOSIS — L299 Pruritus, unspecified: Secondary | ICD-10-CM | POA: Diagnosis not present

## 2017-03-17 DIAGNOSIS — Z6824 Body mass index (BMI) 24.0-24.9, adult: Secondary | ICD-10-CM | POA: Diagnosis not present

## 2017-03-17 DIAGNOSIS — K52831 Collagenous colitis: Secondary | ICD-10-CM | POA: Diagnosis not present

## 2017-03-17 DIAGNOSIS — M3500 Sicca syndrome, unspecified: Secondary | ICD-10-CM | POA: Diagnosis not present

## 2017-03-17 DIAGNOSIS — E790 Hyperuricemia without signs of inflammatory arthritis and tophaceous disease: Secondary | ICD-10-CM | POA: Diagnosis not present

## 2017-03-17 DIAGNOSIS — M0579 Rheumatoid arthritis with rheumatoid factor of multiple sites without organ or systems involvement: Secondary | ICD-10-CM | POA: Diagnosis not present

## 2017-03-20 ENCOUNTER — Ambulatory Visit (HOSPITAL_COMMUNITY)
Admission: RE | Admit: 2017-03-20 | Discharge: 2017-03-20 | Disposition: A | Discharge status: Home / Self Care | Payer: Medicare Other | Source: Ambulatory Visit | Attending: Gastroenterology | Admitting: Gastroenterology

## 2017-03-20 ENCOUNTER — Encounter: Payer: Self-pay | Admitting: Gastroenterology

## 2017-03-20 ENCOUNTER — Ambulatory Visit (INDEPENDENT_AMBULATORY_CARE_PROVIDER_SITE_OTHER): Payer: Medicare Other | Admitting: Gastroenterology

## 2017-03-20 VITALS — BP 97/67 | HR 62 | Temp 97.4°F | Ht 66.0 in | Wt 147.8 lb

## 2017-03-20 DIAGNOSIS — M8580 Other specified disorders of bone density and structure, unspecified site: Secondary | ICD-10-CM | POA: Diagnosis not present

## 2017-03-20 DIAGNOSIS — K52831 Collagenous colitis: Secondary | ICD-10-CM

## 2017-03-20 DIAGNOSIS — R634 Abnormal weight loss: Secondary | ICD-10-CM | POA: Diagnosis not present

## 2017-03-20 DIAGNOSIS — M47814 Spondylosis without myelopathy or radiculopathy, thoracic region: Secondary | ICD-10-CM | POA: Insufficient documentation

## 2017-03-20 NOTE — Assessment & Plan Note (Signed)
Persistent weight loss noted that is quite concerning. Thorough evaluation as noted in HPI. I am ordering a chest xray today to assess for any obvious concerning etiology for weight loss. CT chest will be ordered as appropriate.

## 2017-03-20 NOTE — Patient Instructions (Signed)
Please complete the stool test when you are able.   I spoke with radiology, and they advised to do a chest xray first, which will then let us know how to order the CT chest (if needed). You can actually go on over and get the chest xray done today!   I am referring you to physical therapy.   Further recommendations to follow!

## 2017-03-20 NOTE — Progress Notes (Signed)
Referring Provider: [name], MD Primary Care Physician:  [name], MD  Primary GI: Dr. Gala Romney   Chief Complaint  Patient presents with  . Diarrhea  . Emesis    x1 few days ago    HPI:   Kevin Mathis is a 81 y.o. male presenting today with a history of collagenous colitis in July 2017, completing course of Uceris. Last seen Jan 2018. Baseline stool habits are 1-2 loose stool per day. Outside CT by Dr. 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 MRI Jan 2018 to further assess liver lesion and noted benign appearance. US abdomen with elastography performed with F2/F3 score, moderately elevated transaminases with evaluation negative thus far and felt to be ETOH-related. Celiac serologies negative. TSH normal. He is continuing to lose weight. No anemia on last CBC in Aug 2017.   Taking Bactrim now. Took antibiotics in January. Didn't feel bad until 12-15 months ago. Feels fatigued. Still at baseline bowel habits of 1-2 loose per day, which has been lifelong. Occasionally will have one day of numerous loose stools. No abdominal pain. Appetite is fair. Colestid taken prn. He wonders if he needs another round of Uceris.   He states he took Zenpep but had to stop due to adverse side effects, BUT HE DOES NOT REMEMBER WHAT IT WAS.    Past Medical History:  Diagnosis Date  . Collagenous colitis   . Coronary atherosclerosis of native coronary artery    DES RCA 5/11, LVEF 55%  . Essential hypertension, benign   . GERD (gastroesophageal reflux disease)   . Hiatal hernia   . Mixed hyperlipidemia   . Obstructive sleep apnea   . Prostate cancer ([place])   . Pulmonary nodules   . Rheumatoid arthritis [facility])     Past  Surgical History:  Procedure Laterality Date  . APPENDECTOMY    . COLONOSCOPY  06/2016   Dr. Britta Mccreedy: mild diverticulosis in left colon, sessile polyp 3-7 mm in distal descending colon, biopsies of colon to assess for microscopic colitis. path: collagenous colitis. colon polyp was benign polypoid colorectal mucosa   . COLONOSCOPY  04/2014   Dr. Britta Mccreedy: office notes stated 3 tubular adenomas and diverticulosis of left colon   . CORONARY ANGIOPLASTY WITH STENT PLACEMENT  2011  . ESOPHAGOGASTRODUODENOSCOPY  06/2016   Dr. Britta Mccreedy: gastritis, path: chronic active gastritis, negative H.pylori   . HEMORRHOID SURGERY    . LAPAROSCOPIC CHOLECYSTECTOMY    . PROSTATECTOMY    . Skin cancer resection      Current Outpatient Prescriptions  Medication Sig Dispense Refill  . amLODipine (NORVASC) 5 MG tablet TAKE 1 TABLET DAILY 90 tablet 3  . aspirin 81 MG tablet Take 1 tablet (81 mg total) by mouth every other day. (Patient taking differently: Take 81 mg by mouth daily. ) 30 tablet 3  . cevimeline (EVOXAC) 30 MG capsule Take 30 mg by mouth as needed. May add 1 tab at bedtime as needed.    . Cholecalciferol (VITAMIN D) 2000 units CAPS Take 1 capsule by mouth daily. Reported on 06/13/2016    . cholestyramine (QUESTRAN) 4 GM/DOSE powder Take 0.5 packets (2 g total) by mouth daily. Do not take with other medications. Take other medications an hour before or 4  hours after (Patient taking differently: Take 2 g by mouth as needed. Do not take with other medications. Take other medications an hour before or 4 hours after) 378 g 12  . cholestyramine (QUESTRAN) 4 GM/DOSE powder Take 0.5 packets (2 g total) by mouth 2 (two) times daily as needed. Do not take within 4 hours of other medications. Hold for constipation. 378 g 0  . CINNAMON PO Take 3,000 mg by mouth daily. Reported on 06/13/2016    . clobetasol (TEMOVATE) 0.05 % GEL Apply topically as needed.    . colchicine 0.6 MG tablet Take 0.6 mg by mouth 2 (two) times  daily.      Marland Kitchen esomeprazole (NEXIUM) 40 MG capsule Take 40 mg by mouth daily.     Marland Kitchen gabapentin (NEURONTIN) 300 MG capsule Take 1 capsule (300 mg total) by mouth 2 (two) times daily. Take 1 around 6pm and 2 at bedtime 270 capsule 3  . metoprolol tartrate (LOPRESSOR) 25 MG tablet TAKE ONE-HALF (1/2) TABLET TWICE A DAY 90 tablet 3  . Multiple Vitamin (MULTIVITAMIN) tablet Take 1 tablet by mouth daily. Reported on 06/13/2016    . nitroGLYCERIN (NITROSTAT) 0.4 MG SL tablet Place 1 tablet (0.4 mg total) under the tongue every 5 (five) minutes as needed. 25 tablet 3  . NON FORMULARY CPAP Use as directed     . omega-3 acid ethyl esters (LOVAZA) 1 g capsule TAKE 2 CAPSULES TWICE A DAY 360 capsule 3  . Probiotic Product (PROBIOTIC DAILY PO) Take 1 Can by mouth daily. Reported on 06/13/2016    . sulfamethoxazole-trimethoprim (BACTRIM DS,SEPTRA DS) 800-160 MG tablet Take 1 tablet by mouth 2 (two) times daily.    . Thiamine HCl (VITAMIN B-1) 250 MG tablet Take 250 mg by mouth daily. Reported on 06/13/2016    . vitamin B-12 (CYANOCOBALAMIN) 1000 MCG tablet Take 1,000 mcg by mouth daily. Reported on 06/13/2016    . zinc gluconate 50 MG tablet Take 50 mg by mouth 2 (two) times daily.      No current facility-administered medications for this visit.     Allergies as of 03/20/2017 - Review Complete 03/20/2017  Allergen Reaction Noted  . Cefprozil  04/17/2015  . Doxycycline Hives and Itching 03/20/2017  . Zenpep [pancrelipase (lip-prot-amyl)]  03/20/2017  . Levofloxacin Rash   . Penicillins Rash   . Statins Other (See Comments) and Rash 11/05/2011    Family History  Problem Relation Age of Onset  . Colon cancer Mother 51  . Hypertension      Social History   Social History  . Marital status: Married    Spouse name: N/A  . Number of children: N/A  . Years of education: N/A   Social History Main Topics  . Smoking status: Former Smoker    Packs/day: 1.00    Years: 40.00    Types: Cigarettes     Start date: 12/30/1951    Quit date: 12/30/1991  . Smokeless tobacco: Never Used  . Alcohol use 0.0 oz/week     Comment: 1 happy hour beer a day   . Drug use: No  . Sexual activity: Not Asked   Other Topics Concern  . None   Social History Narrative  . None    Review of Systems: As mentioned in HPI   Physical Exam: BP 97/67   Pulse 62   Temp 97.4 F (36.3 C) (Oral)   Ht 5\' 6"  (1.676 m)   Wt 147 lb 12.8 oz (67 kg)  BMI 23.86 kg/m  General:   Alert and oriented. No distress noted. Pleasant and cooperative.  Head:  Normocephalic and atraumatic. Eyes:  Conjuctiva clear without scleral icterus. Mouth:  Oral mucosa pink and moist. Good dentition. No lesions. Abdomen:  +BS, soft, non-tender and non-distended. No rebound or guarding. No HSM or masses noted. Msk:  Kyphosis  Extremities:  Without edema. Neurologic:  Alert and  oriented x4;  grossly normal neurologically. Psych:  Alert and cooperative. Normal mood and affect.

## 2017-03-20 NOTE — Assessment & Plan Note (Signed)
Appears to clinically be at his baseline. However, he does note occasional multiple bouts of diarrhea. Difficult to discern if still dealing with microscopic colitis vs his baseline. He also has been exposed to antibiotics on several occasions. Check Cdiff now. Low threshold for additional course of Uceris. May ultimately need colonoscopy with our practice, as we have never performed one here. However, colonoscopy was done by Dr. Britta Mccreedy in July 2017, so the need for this remains to be seen.

## 2017-03-23 ENCOUNTER — Telehealth: Payer: Self-pay

## 2017-03-23 ENCOUNTER — Other Ambulatory Visit: Payer: Self-pay

## 2017-03-23 DIAGNOSIS — Z0189 Encounter for other specified special examinations: Secondary | ICD-10-CM | POA: Diagnosis not present

## 2017-03-23 DIAGNOSIS — J309 Allergic rhinitis, unspecified: Secondary | ICD-10-CM | POA: Diagnosis not present

## 2017-03-23 DIAGNOSIS — R634 Abnormal weight loss: Secondary | ICD-10-CM

## 2017-03-23 DIAGNOSIS — J342 Deviated nasal septum: Secondary | ICD-10-CM | POA: Diagnosis not present

## 2017-03-23 DIAGNOSIS — J320 Chronic maxillary sinusitis: Secondary | ICD-10-CM | POA: Diagnosis not present

## 2017-03-23 DIAGNOSIS — R945 Abnormal results of liver function studies: Principal | ICD-10-CM

## 2017-03-23 DIAGNOSIS — J322 Chronic ethmoidal sinusitis: Secondary | ICD-10-CM | POA: Diagnosis not present

## 2017-03-23 DIAGNOSIS — R7989 Other specified abnormal findings of blood chemistry: Secondary | ICD-10-CM

## 2017-03-23 NOTE — Telephone Encounter (Signed)
Completed.

## 2017-03-23 NOTE — Telephone Encounter (Signed)
PT order form faxed to [facility]. [phone].

## 2017-03-23 NOTE — Progress Notes (Signed)
CC'D TO PCP °

## 2017-03-23 NOTE — Telephone Encounter (Signed)
[place] this morning for PT referral. Pt requested PT in [place]. Butch Penny faxed form to our office to be completed.  Vicente Males, I placed therapy order form in your chair.

## 2017-03-25 ENCOUNTER — Other Ambulatory Visit: Payer: Self-pay | Admitting: Gastroenterology

## 2017-03-25 DIAGNOSIS — R634 Abnormal weight loss: Secondary | ICD-10-CM

## 2017-03-25 DIAGNOSIS — Z87891 Personal history of nicotine dependence: Secondary | ICD-10-CM

## 2017-03-25 NOTE — Progress Notes (Unsigned)
Chest xray reviewed without obvious abnormality. He will need a CT chest without contrast to ensure no occult malignancy. I have already placed the order. Please schedule for patient.  I also am awaiting his stool studies.

## 2017-03-25 NOTE — Progress Notes (Signed)
CT chest without contrast scheduled for 04/03/17 at 8:00am, arrive at 7:45am. Called and informed pt's wife. Letter mailed.

## 2017-04-01 ENCOUNTER — Telehealth: Payer: Self-pay | Admitting: Internal Medicine

## 2017-04-01 MED ORDER — BUDESONIDE 9 MG PO TB24: 1.0000 | ORAL_TABLET | Freq: Every day | ORAL | 1 refills | Status: DC

## 2017-04-01 NOTE — Telephone Encounter (Signed)
I sent in 90 tablets BUT HE ONLY NEEDS TO TAKE 4 weeks, then call us with an update. I did 90 tablets for insurance purposes and hopefully more cost-effective. If not, let me know. Did he ever complete the Cdiff? I didn't realize he had restarted the uceris.

## 2017-04-01 NOTE — Telephone Encounter (Signed)
[phone] patient  taking uceris and it is working great, he is going to run out because his last prescription was from dr. Britta Mccreedy.  Kevin Mathis, can you [facility]  He states you discussed this with him at his last office visit

## 2017-04-01 NOTE — Telephone Encounter (Signed)
Routing to AB.  

## 2017-04-02 NOTE — Telephone Encounter (Signed)
Spoke with Butch Penny at [facility] PT services to f/u on referral. She said she had scheduled the pt for 04/02/17 and when she called him he told her he was having surgery and he would call to reschedule when he could.

## 2017-04-02 NOTE — Telephone Encounter (Signed)
Ok. I sent in #90

## 2017-04-02 NOTE — Telephone Encounter (Signed)
pts wife- Danne [place] is aware. She said he has been taking some that he already had for about a week and is feeling better now. She will have him call after he has been on it for 4 weeks. She said it is more cost effective to get #90. They will have to pay the same price for #90 as they did for #30. He has not done his stool sample yet. He recently had sinus surgery and was going to take the stool sample back next week when he has his labs done. I advised her that if it is formed stool, the lab will not take it. She said she understood and will let him know.

## 2017-04-03 ENCOUNTER — Ambulatory Visit (HOSPITAL_COMMUNITY)
Admission: RE | Admit: 2017-04-03 | Discharge: 2017-04-03 | Disposition: A | Discharge status: Home / Self Care | Payer: Medicare Other | Source: Ambulatory Visit | Attending: Gastroenterology | Admitting: Gastroenterology

## 2017-04-03 DIAGNOSIS — R634 Abnormal weight loss: Secondary | ICD-10-CM | POA: Insufficient documentation

## 2017-04-03 DIAGNOSIS — J432 Centrilobular emphysema: Secondary | ICD-10-CM | POA: Insufficient documentation

## 2017-04-03 DIAGNOSIS — R7989 Other specified abnormal findings of blood chemistry: Secondary | ICD-10-CM | POA: Diagnosis not present

## 2017-04-03 DIAGNOSIS — I700 Atherosclerosis of aorta: Secondary | ICD-10-CM | POA: Insufficient documentation

## 2017-04-03 DIAGNOSIS — Z87891 Personal history of nicotine dependence: Secondary | ICD-10-CM | POA: Insufficient documentation

## 2017-04-03 DIAGNOSIS — I2510 Atherosclerotic heart disease of native coronary artery without angina pectoris: Secondary | ICD-10-CM | POA: Insufficient documentation

## 2017-04-03 LAB — HEPATIC FUNCTION PANEL
ALBUMIN: 3.6 g/dL (ref 3.6–5.1)
ALT: 51 U/L — ABNORMAL HIGH (ref 9–46)
AST: 63 U/L — ABNORMAL HIGH (ref 10–35)
Alkaline Phosphatase: 101 U/L (ref 40–115)
BILIRUBIN TOTAL: 0.6 mg/dL (ref 0.2–1.2)
Bilirubin, Direct: 0.2 mg/dL (ref <=0.2)
Indirect Bilirubin: 0.4 mg/dL (ref 0.2–1.2)
Total Protein: 6.5 g/dL (ref 6.1–8.1)

## 2017-04-06 DIAGNOSIS — J320 Chronic maxillary sinusitis: Secondary | ICD-10-CM | POA: Diagnosis not present

## 2017-04-06 DIAGNOSIS — J343 Hypertrophy of nasal turbinates: Secondary | ICD-10-CM | POA: Diagnosis not present

## 2017-04-06 DIAGNOSIS — Z7289 Other problems related to lifestyle: Secondary | ICD-10-CM | POA: Diagnosis not present

## 2017-04-06 DIAGNOSIS — Z87891 Personal history of nicotine dependence: Secondary | ICD-10-CM | POA: Diagnosis not present

## 2017-04-08 NOTE — Progress Notes (Signed)
LFTs better. Repeat in 3 months.

## 2017-04-08 NOTE — Progress Notes (Signed)
CT chest negative. Please send to PCP so they can have on file, along with this result note comment. Reason we ordered: unintentional weight loss. Have him return to see me in 4-6 weeks at most.

## 2017-04-09 ENCOUNTER — Other Ambulatory Visit: Payer: Self-pay | Admitting: Gastroenterology

## 2017-04-09 ENCOUNTER — Encounter: Payer: Self-pay | Admitting: Gastroenterology

## 2017-04-09 DIAGNOSIS — R7989 Other specified abnormal findings of blood chemistry: Secondary | ICD-10-CM

## 2017-04-09 DIAGNOSIS — R945 Abnormal results of liver function studies: Principal | ICD-10-CM

## 2017-04-09 NOTE — Progress Notes (Signed)
APPOINTMENT MADE AND LETTER SENT °

## 2017-04-21 ENCOUNTER — Telehealth: Payer: Self-pay | Admitting: Nurse Practitioner

## 2017-04-21 DIAGNOSIS — Z7289 Other problems related to lifestyle: Secondary | ICD-10-CM | POA: Diagnosis not present

## 2017-04-21 DIAGNOSIS — E790 Hyperuricemia without signs of inflammatory arthritis and tophaceous disease: Secondary | ICD-10-CM | POA: Diagnosis not present

## 2017-04-21 DIAGNOSIS — J343 Hypertrophy of nasal turbinates: Secondary | ICD-10-CM | POA: Diagnosis not present

## 2017-04-21 DIAGNOSIS — Z87891 Personal history of nicotine dependence: Secondary | ICD-10-CM | POA: Diagnosis not present

## 2017-04-21 DIAGNOSIS — J320 Chronic maxillary sinusitis: Secondary | ICD-10-CM | POA: Diagnosis not present

## 2017-04-21 MED ORDER — GABAPENTIN 300 MG PO CAPS: 300.0000 mg | ORAL_CAPSULE | Freq: Two times a day (BID) | ORAL | 0 refills | Status: DC

## 2017-04-21 NOTE — Telephone Encounter (Signed)
Pt request refill for gabapentin (NEURONTIN) 300 MG capsule 90 day supply sent to [facility]

## 2017-04-22 DIAGNOSIS — M6281 Muscle weakness (generalized): Secondary | ICD-10-CM | POA: Diagnosis not present

## 2017-04-27 MED ORDER — GABAPENTIN 300 MG PO CAPS: 600.0000 mg | ORAL_CAPSULE | Freq: Two times a day (BID) | ORAL | 3 refills | Status: DC

## 2017-04-27 NOTE — Telephone Encounter (Signed)
LMVM for pt at home.  Gabapentin 600mg  po BID, (may take 2 caps at 6p and then 2 caps at bedtime).

## 2017-04-27 NOTE — Telephone Encounter (Signed)
Spoke to wife.  He is taking gabapentin 381m po qid ( 300mg  6p, 300mg  8p, 600mg  9p).  Wife states still having pain and restlessness.  Has appt 6-18/18).  Has about 6 days left until med runs out.  Can dose be increased?  Other therapy discussed  When in for appt?

## 2017-04-27 NOTE — Addendum Note (Signed)
Addended by: Evlyn Courier C on: 04/27/2017 01:50 PM   Modules accepted: Orders

## 2017-04-27 NOTE — Telephone Encounter (Signed)
Pt called said [facility] needs more information before it can be shipped. He will run out in 5 days. He said he is taking 4/day for about 1 mth said his feet will not be still and feels like pins sticking in them if he doesn't. He will run out sooner than expected. Needs new directions and qty

## 2017-04-27 NOTE — Telephone Encounter (Signed)
Patient can increase to 600 mg twice a day gabapentin.

## 2017-04-27 NOTE — Telephone Encounter (Signed)
Does the pt need this called to local pharmacy. [facility]

## 2017-04-27 NOTE — Telephone Encounter (Signed)
No I asked and will call if needed but for now, [facility].

## 2017-04-28 DIAGNOSIS — M6281 Muscle weakness (generalized): Secondary | ICD-10-CM | POA: Diagnosis not present

## 2017-04-28 NOTE — Progress Notes (Signed)
Cardiology Office Note  Date: 04/29/2017   ID: JONPAUL LUMM, DOB 10-27-1936, MRN [id number]  PCP: [name], MD  Primary Cardiologist: Rozann Lesches, MD   Chief Complaint  Patient presents with  . Coronary Artery Disease    History of Present Illness: LORRAINE TERRIQUEZ is a 81 y.o. male last seen in November 2017. He presents for a routine follow-up visit. Complains of lack of energy and dyspnea on exertion which has been chronic over the last year in association with his weight loss and intermittent bowel changes. He continues to follow with GI, history of collagenous colitis. He does not report frank angina symptoms or nitroglycerin use. Stress testing from within the last 2 years was overall low risk.  He did have a recent screening chest CT to assess for any occult malignancy, no obvious abnormalities noted in this regard.  He also had recent lab work as outlined below. LFTs are mildly elevated but otherwise no major abnormalities.  Past Medical History:  Diagnosis Date  . Collagenous colitis   . Coronary atherosclerosis of native coronary artery    DES RCA 5/11, LVEF 55%  . Essential hypertension, benign   . GERD (gastroesophageal reflux disease)   . Hiatal hernia   . Mixed hyperlipidemia   . Obstructive sleep apnea   . Prostate cancer ([place])   . Pulmonary nodules   . Rheumatoid arthritis [facility])     Past Surgical History:  Procedure Laterality Date  . APPENDECTOMY    . COLONOSCOPY  06/2016   Dr. Britta Mccreedy: mild diverticulosis in left colon, sessile polyp 3-7 mm in distal descending colon, biopsies of colon to assess for microscopic colitis. path: collagenous colitis. colon polyp was benign polypoid colorectal mucosa   . COLONOSCOPY  04/2014   Dr. Britta Mccreedy: office notes stated 3 tubular adenomas and diverticulosis of left colon   . CORONARY ANGIOPLASTY WITH STENT PLACEMENT  2011  . ESOPHAGOGASTRODUODENOSCOPY  06/2016   Dr. Britta Mccreedy: gastritis, path: chronic active  gastritis, negative H.pylori   . HEMORRHOID SURGERY    . LAPAROSCOPIC CHOLECYSTECTOMY    . PROSTATECTOMY    . Skin cancer resection      Current Outpatient Prescriptions  Medication Sig Dispense Refill  . amLODipine (NORVASC) 5 MG tablet TAKE 1 TABLET DAILY 90 tablet 3  . aspirin 81 MG tablet Take 1 tablet (81 mg total) by mouth every other day. (Patient taking differently: Take 81 mg by mouth daily. ) 30 tablet 3  . Budesonide 9 MG TB24 Take 1 tablet by mouth daily. 90 tablet 1  . cevimeline (EVOXAC) 30 MG capsule Take 30 mg by mouth as needed. May add 1 tab at bedtime as needed.    . Cholecalciferol (VITAMIN D) 2000 units CAPS Take 1 capsule by mouth daily. Reported on 06/13/2016    . cholestyramine (QUESTRAN) 4 GM/DOSE powder Take 0.5 packets (2 g total) by mouth 2 (two) times daily as needed. Do not take within 4 hours of other medications. Hold for constipation. 378 g 0  . CINNAMON PO Take 3,000 mg by mouth daily. Reported on 06/13/2016    . clobetasol (TEMOVATE) 0.05 % GEL Apply topically as needed.    . colchicine 0.6 MG tablet Take 0.6 mg by mouth 2 (two) times daily.      Marland Kitchen esomeprazole (NEXIUM) 40 MG capsule Take 40 mg by mouth daily.     Marland Kitchen gabapentin (NEURONTIN) 300 MG capsule Take 2 capsules (600 mg total) by mouth 2 (two)  times daily. Take 2 around 6pm and 2 at bedtime 120 capsule 3  . metoprolol tartrate (LOPRESSOR) 25 MG tablet TAKE ONE-HALF (1/2) TABLET TWICE A DAY 90 tablet 3  . Multiple Vitamin (MULTIVITAMIN) tablet Take 1 tablet by mouth daily. Reported on 06/13/2016    . nitroGLYCERIN (NITROSTAT) 0.4 MG SL tablet Place 1 tablet (0.4 mg total) under the tongue every 5 (five) minutes as needed. 25 tablet 3  . NON FORMULARY CPAP Use as directed     . omega-3 acid ethyl esters (LOVAZA) 1 g capsule TAKE 2 CAPSULES TWICE A DAY 360 capsule 3  . Probiotic Product (PROBIOTIC DAILY PO) Take 1 Can by mouth daily. Reported on 06/13/2016    . Thiamine HCl (VITAMIN B-1) 250 MG tablet  Take 250 mg by mouth daily. Reported on 06/13/2016    . vitamin B-12 (CYANOCOBALAMIN) 1000 MCG tablet Take 1,000 mcg by mouth daily. Reported on 06/13/2016    . zinc gluconate 50 MG tablet Take 50 mg by mouth 2 (two) times daily.      No current facility-administered medications for this visit.    Allergies:  Cefprozil; Doxycycline; Zenpep [pancrelipase (lip-prot-amyl)]; Levofloxacin; Penicillins; and Statins   Social History: The patient  reports that he quit smoking about 25 years ago. His smoking use included Cigarettes. He started smoking about 65 years ago. He has a 40.00 pack-year smoking history. He has never used smokeless tobacco. He reports that he drinks alcohol. He reports that he does not use drugs.   ROS:  Please see the history of present illness. Otherwise, complete review of systems is positive for stable appetite.  All other systems are reviewed and negative.   Physical Exam: VS:  BP 108/69   Pulse 66   Ht 5\' 6"  (1.676 m)   Wt 148 lb (67.1 kg)   BMI 23.89 kg/m , BMI Body mass index is 23.89 kg/m.  Wt Readings from Last 3 Encounters:  04/29/17 148 lb (67.1 kg)  03/20/17 147 lb 12.8 oz (67 kg)  01/21/17 150 lb 6.4 oz (68.2 kg)    General: Chronically ill-appearing male, no distress. HEENT: Conjunctiva and lids normal, oropharynx clear. Neck: Supple, no elevated JVP or carotid bruits, no thyromegaly. Lungs: Clear to auscultation, nonlabored breathing at rest. Cardiac: Regular rate and rhythm, no S3 or significant systolic murmur, no pericardial rub. Abdomen: Soft, nontender, bowel sounds present. Extremities: No pitting edema, distal pulses 2+. Skin: Warm and dry. Musculoskeletal: No kyphosis. Neuropsychiatric: Alert and oriented x3, affect grossly appropriate.  ECG: I personally reviewed the tracing from 04/07/2016 which showed sinus bradycardia.  Recent Labwork: 12/15/2016: TSH 0.58 01/12/2017: Creatinine, Ser 1.00 04/03/2017: ALT 51; AST 63  March 2018:  Cholesterol 123, triglycerides 127, HDL 25, LDL 73, BUN 10, creatinine 0.9, potassium 4.2, AST 58, ALT 46, hemoglobin 12.6, platelets 143, hemoglobin A1c 5.1  Other Studies Reviewed Today:  Exercise Cardiolite 04/24/2015: FINDINGS: Exercise stress  Baseline EKG shows normal sinus rhythm. The patient was stressed according to the Bruce protocol for 7 minutes and 1 second achieving a work level of 8.30 Mets. The resting heart rate is 60 beats per minute rose to a maximum of 134 beats per minute, representing 93% of the maximal age predicted heart rate. The resting blood pressure of 148/87 increased to a maximum of 189/93. The test was stopped due to fatigue, the patient did not experience any chest pain. Stress EKG showed no specific ischemic changes and no significant arrhythmias.  Perfusion: There is a  medium sized moderate intensity fixed inferior wall defect. The inferior wall has normal wall motion. Findings are most consistent with sub- diaphragmatic attenuation, however cannot rule out prior inferior infarct.  Wall Motion: Normal left ventricular wall motion. No left ventricular dilation.  Left Ventricular Ejection Fraction: 76 %  End diastolic volume 64 ml  End systolic volume 15 ml  IMPRESSION: 1. Medium size moderate intensity inferior wall defect. Normal wall motion would support probable sub- diaphragmatic attenuation, however cannot rule out prior inferior infarct given his history of RCA disease. If true prior infarct there is no peri-infarct ischemia.  2. Normal left ventricular wall motion.  3. Left ventricular ejection fraction 76%  4. Low-risk stress test findings*. Duke treadmill score of 7 supports low risk for major cardiac events.  Chest CT 04/03/2017: IMPRESSION: Underlying centrilobular emphysematous change. No edema or consolidation. Stable small nodular opacities in the lung ; the stability since 2011 is indicative of benign  etiology.  No adenopathy. Multifocal areas of atherosclerotic calcification including multiple foci of coronary artery calcification.  Gallbladder absent.  An etiology for the unintentional weight loss has not been established with this study.  Assessment and Plan:  1. CAD status post DES to the RCA in 2011. Ischemic testing from within the last 2 years was overall low risk. I would like to update his echocardiogram to make sure that he has had no decline in LVEF in association with his generalized decline over the last year.  2. Essential hypertension, blood pressure is well controlled today. He is on Norvasc and Lopressor.  3. Hyperlipidemia, on Lovaza with history of statin intolerance. Recent LDL 73.  4. Remote tobacco use history. Recent chest CT did not reveal any suspicious appearing nodules or lung masses. He did have centrilobular emphysematous changes.  Current medicines were reviewed with the patient today.   Orders Placed This Encounter  Procedures  . ECHOCARDIOGRAM COMPLETE    Disposition: Follow-up in 6 months.  Signed, Satira Sark, MD, [facility] 04/29/2017 10:19 AM    [facility] at [place], [place],  [address] Phone: [phone]; Fax: [phone]

## 2017-04-29 ENCOUNTER — Telehealth: Payer: Self-pay | Admitting: Adult Health

## 2017-04-29 ENCOUNTER — Encounter: Payer: Self-pay | Admitting: *Deleted

## 2017-04-29 ENCOUNTER — Ambulatory Visit (INDEPENDENT_AMBULATORY_CARE_PROVIDER_SITE_OTHER): Payer: Medicare Other | Admitting: Cardiology

## 2017-04-29 VITALS — BP 108/69 | HR 66 | Ht 66.0 in | Wt 148.0 lb

## 2017-04-29 DIAGNOSIS — I25119 Atherosclerotic heart disease of native coronary artery with unspecified angina pectoris: Secondary | ICD-10-CM

## 2017-04-29 DIAGNOSIS — I209 Angina pectoris, unspecified: Secondary | ICD-10-CM | POA: Diagnosis not present

## 2017-04-29 DIAGNOSIS — F17201 Nicotine dependence, unspecified, in remission: Secondary | ICD-10-CM | POA: Diagnosis not present

## 2017-04-29 DIAGNOSIS — E782 Mixed hyperlipidemia: Secondary | ICD-10-CM | POA: Diagnosis not present

## 2017-04-29 DIAGNOSIS — I10 Essential (primary) hypertension: Secondary | ICD-10-CM | POA: Diagnosis not present

## 2017-04-29 NOTE — Telephone Encounter (Signed)
Pre-cert Verification for the following procedure   Echo scheduled for 05/28/17 at [place]

## 2017-04-29 NOTE — Patient Instructions (Signed)

## 2017-04-30 DIAGNOSIS — M6281 Muscle weakness (generalized): Secondary | ICD-10-CM | POA: Diagnosis not present

## 2017-04-30 MED ORDER — GABAPENTIN 300 MG PO CAPS: 600.0000 mg | ORAL_CAPSULE | Freq: Two times a day (BID) | ORAL | 1 refills | Status: DC

## 2017-04-30 NOTE — Addendum Note (Signed)
Addended byOliver Hum on: 04/30/2017 09:45 AM   Modules accepted: Orders

## 2017-04-30 NOTE — Telephone Encounter (Addendum)
[facility] [phone] REF# [id number] called request approval to ship 90 day supply. She request a call back before Monday 5/7 or RX will go out as 30 day supply. Please call

## 2017-04-30 NOTE — Telephone Encounter (Signed)
Spoke to [place], pharmacist.  Kevin Mathis her ok for gabapentin 90 day supply #360 and 1 refill.   Pt has appt 06-15-17.

## 2017-05-05 DIAGNOSIS — M6281 Muscle weakness (generalized): Secondary | ICD-10-CM | POA: Diagnosis not present

## 2017-05-07 DIAGNOSIS — M6281 Muscle weakness (generalized): Secondary | ICD-10-CM | POA: Diagnosis not present

## 2017-05-11 ENCOUNTER — Ambulatory Visit: Payer: Medicare Other | Admitting: Gastroenterology

## 2017-05-19 DIAGNOSIS — E790 Hyperuricemia without signs of inflammatory arthritis and tophaceous disease: Secondary | ICD-10-CM | POA: Diagnosis not present

## 2017-05-19 DIAGNOSIS — L299 Pruritus, unspecified: Secondary | ICD-10-CM | POA: Diagnosis not present

## 2017-05-19 DIAGNOSIS — M150 Primary generalized (osteo)arthritis: Secondary | ICD-10-CM | POA: Diagnosis not present

## 2017-05-19 DIAGNOSIS — M3500 Sicca syndrome, unspecified: Secondary | ICD-10-CM | POA: Diagnosis not present

## 2017-05-19 DIAGNOSIS — M0579 Rheumatoid arthritis with rheumatoid factor of multiple sites without organ or systems involvement: Secondary | ICD-10-CM | POA: Diagnosis not present

## 2017-05-19 DIAGNOSIS — K52831 Collagenous colitis: Secondary | ICD-10-CM | POA: Diagnosis not present

## 2017-05-19 DIAGNOSIS — R5383 Other fatigue: Secondary | ICD-10-CM | POA: Diagnosis not present

## 2017-05-19 DIAGNOSIS — Z6824 Body mass index (BMI) 24.0-24.9, adult: Secondary | ICD-10-CM | POA: Diagnosis not present

## 2017-05-19 DIAGNOSIS — L439 Lichen planus, unspecified: Secondary | ICD-10-CM | POA: Diagnosis not present

## 2017-05-19 DIAGNOSIS — Z79899 Other long term (current) drug therapy: Secondary | ICD-10-CM | POA: Diagnosis not present

## 2017-05-19 DIAGNOSIS — R7989 Other specified abnormal findings of blood chemistry: Secondary | ICD-10-CM | POA: Diagnosis not present

## 2017-05-20 ENCOUNTER — Encounter: Payer: Self-pay | Admitting: Gastroenterology

## 2017-05-20 ENCOUNTER — Ambulatory Visit (INDEPENDENT_AMBULATORY_CARE_PROVIDER_SITE_OTHER): Payer: Medicare Other | Admitting: Gastroenterology

## 2017-05-20 VITALS — BP 93/64 | HR 68 | Temp 97.3°F | Ht 66.0 in | Wt 146.6 lb

## 2017-05-20 DIAGNOSIS — R634 Abnormal weight loss: Secondary | ICD-10-CM

## 2017-05-20 DIAGNOSIS — I25119 Atherosclerotic heart disease of native coronary artery with unspecified angina pectoris: Secondary | ICD-10-CM

## 2017-05-20 DIAGNOSIS — K52831 Collagenous colitis: Secondary | ICD-10-CM

## 2017-05-20 NOTE — Patient Instructions (Signed)
Continue your Uceris until May 31st. This will be the last dose.  Let me know how you are doing after you stop this.   I am talking with Dr. Gala Romney about other tests that may be needed.

## 2017-05-20 NOTE — Progress Notes (Signed)
Referring Provider: [name], MD Primary Care Physician:  [name], MD Primary GI: Dr. Gala Romney   Chief Complaint  Patient presents with  . Weight Loss    f/u    HPI:   Kevin Mathis is a 81 y.o. male presenting today with a history of collagenous colitis in July 2017, completing course of Uceris. Last seen March 2018. Baseline stool habits are 1-2 loose stool per day. Outside CT by Dr. 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 MRI Jan 2018 to further assess liver lesion and noted benign appearance. US abdomen with elastography performed with F2/F3 score, moderately elevated transaminases with evaluation negative thus far and felt to be ETOH-related. Celiac serologies negative. TSH normal. No anemia on last CBC in Aug 2017. Zenpep stopped due to "adverse effects" but he can't remember what this was. CT chest negative.   Started taking another round of Uceris in early April, unbeknownst to me; however, he noted improvement in his bowel habits.   He has lost another pound from last visit. However, his weight appears to be slowing. Last April, he weighed 166. Sept 2017 154, Jan 150.   Sometimes 1-2 bowel movements a day, sometimes 4-5, soft-serve. If gets too runny, will take Questran, but this doesn't do well as just taking anti-diarrheal agents. States he has been weighing every day at home, but it is hovering right around 144 without clothes. Good appetite and states he is eating "more and better".   Past Medical History:  Diagnosis Date  . Collagenous colitis   . Coronary atherosclerosis of native coronary artery    DES RCA 5/11, LVEF 55%  . Essential hypertension, benign   . GERD (gastroesophageal  reflux disease)   . Hiatal hernia   . Mixed hyperlipidemia   . Obstructive sleep apnea   . Prostate cancer ([place])   . Pulmonary nodules   . Rheumatoid arthritis [facility])     Past Surgical History:  Procedure Laterality Date  . APPENDECTOMY    . COLONOSCOPY  06/2016   Dr. Britta Mccreedy: mild diverticulosis in left colon, sessile polyp 3-7 mm in distal descending colon, biopsies of colon to assess for microscopic colitis. path: collagenous colitis. colon polyp was benign polypoid colorectal mucosa   . COLONOSCOPY  04/2014   Dr. Britta Mccreedy: office notes stated 3 tubular adenomas and diverticulosis of left colon   . CORONARY ANGIOPLASTY WITH STENT PLACEMENT  2011  . ESOPHAGOGASTRODUODENOSCOPY  06/2016   Dr. Britta Mccreedy: gastritis, path: chronic active gastritis, negative H.pylori   . HEMORRHOID SURGERY    . LAPAROSCOPIC CHOLECYSTECTOMY    . PROSTATECTOMY    . Skin cancer resection      Current Outpatient Prescriptions  Medication Sig Dispense Refill  . amLODipine (NORVASC) 5 MG tablet TAKE 1 TABLET DAILY 90 tablet 3  . aspirin 81 MG tablet Take 1 tablet (81 mg total) by mouth every other day. (Patient taking differently: Take 81 mg by mouth daily. ) 30 tablet 3  . Budesonide 9 MG TB24 Take 1 tablet by mouth daily. 90 tablet 1  . cevimeline (EVOXAC) 30 MG capsule Take 30 mg by mouth as needed. May add 1 tab at bedtime as needed.    . Cholecalciferol (VITAMIN D) 2000 units CAPS  Take 1 capsule by mouth daily. Reported on 06/13/2016    . cholestyramine (QUESTRAN) 4 GM/DOSE powder Take 0.5 packets (2 g total) by mouth 2 (two) times daily as needed. Do not take within 4 hours of other medications. Hold for constipation. 378 g 0  . CINNAMON PO Take 3,000 mg by mouth daily. Reported on 06/13/2016    . clobetasol (TEMOVATE) 0.05 % GEL Apply topically as needed.    . colchicine 0.6 MG tablet Take 0.6 mg by mouth 2 (two) times daily.      Marland Kitchen esomeprazole (NEXIUM) 40 MG capsule Take 40 mg by mouth daily.     Marland Kitchen  gabapentin (NEURONTIN) 300 MG capsule Take 2 capsules (600 mg total) by mouth 2 (two) times daily. Take 2 around 6pm and 2 at bedtime 360 capsule 1  . metoprolol tartrate (LOPRESSOR) 25 MG tablet TAKE ONE-HALF (1/2) TABLET TWICE A DAY 90 tablet 3  . Multiple Vitamin (MULTIVITAMIN) tablet Take 1 tablet by mouth daily. Reported on 06/13/2016    . nitroGLYCERIN (NITROSTAT) 0.4 MG SL tablet Place 1 tablet (0.4 mg total) under the tongue every 5 (five) minutes as needed. 25 tablet 3  . NON FORMULARY CPAP Use as directed     . omega-3 acid ethyl esters (LOVAZA) 1 g capsule TAKE 2 CAPSULES TWICE A DAY 360 capsule 3  . Probiotic Product (PROBIOTIC DAILY PO) Take 1 Can by mouth daily. Reported on 06/13/2016    . Thiamine HCl (VITAMIN B-1) 250 MG tablet Take 250 mg by mouth daily. Reported on 06/13/2016    . vitamin B-12 (CYANOCOBALAMIN) 1000 MCG tablet Take 1,000 mcg by mouth daily. Reported on 06/13/2016    . zinc gluconate 50 MG tablet Take 50 mg by mouth 2 (two) times daily.      No current facility-administered medications for this visit.     Allergies as of 05/20/2017 - Review Complete 05/20/2017  Allergen Reaction Noted  . Cefprozil  04/17/2015  . Doxycycline Hives and Itching 03/20/2017  . Zenpep [pancrelipase (lip-prot-amyl)]  03/20/2017  . Levofloxacin Rash   . Penicillins Rash   . Statins Other (See Comments) and Rash 11/05/2011    Family History  Problem Relation Age of Onset  . Colon cancer Mother 13  . Hypertension Unknown     Social History   Social History  . Marital status: Married    Spouse name: N/A  . Number of children: N/A  . Years of education: N/A   Social History Main Topics  . Smoking status: Former Smoker    Packs/day: 1.00    Years: 40.00    Types: Cigarettes    Start date: 12/30/1951    Quit date: 12/30/1991  . Smokeless tobacco: Never Used  . Alcohol use 0.0 oz/week     Comment: 1 happy hour beer a day   . Drug use: No  . Sexual activity: Not Asked    Other Topics Concern  . None   Social History Narrative  . None    Review of Systems: As mentioned in HPI   Physical Exam: BP 93/64   Pulse 68   Temp 97.3 F (36.3 C) (Oral)   Ht 5\' 6"  (1.676 m)   Wt 146 lb 9.6 oz (66.5 kg)   BMI 23.66 kg/m  General:   Alert and oriented. No distress noted. Pleasant and cooperative.  Head:  Normocephalic and atraumatic. Eyes:  Conjuctiva clear without scleral icterus. Mouth:  Oral mucosa pink and moist. Good dentition.  No lesions. Abdomen:  +BS, soft, non-tender and non-distended. No rebound or guarding. Umbilical hernia noted.  Msk:  Kyphosis noted  Extremities:  Without edema. Neurologic:  Alert and  oriented x4;  grossly normal neurologically. Psych:  Alert and cooperative. Normal mood and affect.

## 2017-05-21 DIAGNOSIS — M6281 Muscle weakness (generalized): Secondary | ICD-10-CM | POA: Diagnosis not present

## 2017-05-24 NOTE — Assessment & Plan Note (Signed)
81 year old male with persistent weight loss although seems to have slowed now. No abdominal pain noted. TSH normal, celiac serologies negative, CT/MRI on file, CT chest negative for occult malignancy. Last colonoscopy/EGD in July 2017 by Dr. Britta Mccreedy. Recent labs completed and will obtain these. Thus far, evaluation for weight loss has been inconclusive. Although he has a history of collagenous colitis, he was doing well and at baseline for bowel habits although it appears he has had a slight relapse recently and is completing second course of Uceris. Steady weight loss still concerning. Does not appear to have symptoms consistent with mesenteric ischemia. Will need to discuss further with Dr. Gala Romney. Patient is to complete this course of Uceris and let me know how he is doing after a week off of therapy. We have never performed a colonoscopy, so repeating this with our practice remains to be seen. Further recommendations to follow.

## 2017-05-26 DIAGNOSIS — M6281 Muscle weakness (generalized): Secondary | ICD-10-CM | POA: Diagnosis not present

## 2017-05-26 NOTE — Progress Notes (Signed)
No pcp per patient 

## 2017-05-27 ENCOUNTER — Telehealth: Payer: Self-pay | Admitting: Gastroenterology

## 2017-05-27 NOTE — Telephone Encounter (Signed)
Kevin Mathis:  Please tell patient I discussed with Dr. Gala Romney. Let's have him return in 6 weeks. I can send in a prescription for a medication that will help stimulate his appetite, if he would like. It appears he may be coming to a plateau with weight loss. No need for colonoscopy/EGD, as this was done less than a year ago by Dr. Britta Mccreedy.

## 2017-05-28 ENCOUNTER — Ambulatory Visit (INDEPENDENT_AMBULATORY_CARE_PROVIDER_SITE_OTHER): Payer: Medicare Other

## 2017-05-28 ENCOUNTER — Other Ambulatory Visit: Payer: Self-pay

## 2017-05-28 DIAGNOSIS — I25119 Atherosclerotic heart disease of native coronary artery with unspecified angina pectoris: Secondary | ICD-10-CM

## 2017-05-28 NOTE — Telephone Encounter (Signed)
Pt is aware. He wants to stop the uceris first and see how he does and he will call back.  Please schedule ov.

## 2017-05-29 ENCOUNTER — Encounter: Payer: Self-pay | Admitting: Gastroenterology

## 2017-05-29 ENCOUNTER — Telehealth: Payer: Self-pay

## 2017-05-29 DIAGNOSIS — M6281 Muscle weakness (generalized): Secondary | ICD-10-CM | POA: Diagnosis not present

## 2017-05-29 NOTE — Telephone Encounter (Signed)
APPT MADE AND LETTER SENT  °

## 2017-05-29 NOTE — Telephone Encounter (Signed)
Patient notified. No PCP  

## 2017-05-29 NOTE — Telephone Encounter (Signed)
-----   Message from Merlene Laughter, LPN sent at 0/01/1114  7:19 AM EDT -----   ----- Message ----- From: Satira Sark, MD Sent: 05/28/2017   4:49 PM To: Merlene Laughter, LPN  Results reviewed. LV function remains normal at 55-60% which was main reason for checking this study. He does have mild aortic stenosis, although this is not a symptomatic issue at this point we can follow him on examination for now. A copy of this test should be forwarded to Patient, No Pcp Per.

## 2017-05-29 NOTE — Telephone Encounter (Signed)
Great!

## 2017-06-01 DIAGNOSIS — Z7289 Other problems related to lifestyle: Secondary | ICD-10-CM | POA: Diagnosis not present

## 2017-06-01 DIAGNOSIS — H9192 Unspecified hearing loss, left ear: Secondary | ICD-10-CM | POA: Diagnosis not present

## 2017-06-01 DIAGNOSIS — Z87891 Personal history of nicotine dependence: Secondary | ICD-10-CM | POA: Diagnosis not present

## 2017-06-01 DIAGNOSIS — J320 Chronic maxillary sinusitis: Secondary | ICD-10-CM | POA: Diagnosis not present

## 2017-06-01 DIAGNOSIS — J343 Hypertrophy of nasal turbinates: Secondary | ICD-10-CM | POA: Diagnosis not present

## 2017-06-02 DIAGNOSIS — J320 Chronic maxillary sinusitis: Secondary | ICD-10-CM | POA: Diagnosis not present

## 2017-06-03 DIAGNOSIS — M6281 Muscle weakness (generalized): Secondary | ICD-10-CM | POA: Diagnosis not present

## 2017-06-04 ENCOUNTER — Other Ambulatory Visit: Payer: Self-pay

## 2017-06-04 DIAGNOSIS — R945 Abnormal results of liver function studies: Principal | ICD-10-CM

## 2017-06-04 DIAGNOSIS — R7989 Other specified abnormal findings of blood chemistry: Secondary | ICD-10-CM

## 2017-06-05 DIAGNOSIS — K52831 Collagenous colitis: Secondary | ICD-10-CM | POA: Diagnosis not present

## 2017-06-08 ENCOUNTER — Telehealth: Payer: Self-pay | Admitting: Internal Medicine

## 2017-06-08 DIAGNOSIS — M7042 Prepatellar bursitis, left knee: Secondary | ICD-10-CM | POA: Diagnosis not present

## 2017-06-08 DIAGNOSIS — J0101 Acute recurrent maxillary sinusitis: Secondary | ICD-10-CM | POA: Diagnosis not present

## 2017-06-08 DIAGNOSIS — Z6823 Body mass index (BMI) 23.0-23.9, adult: Secondary | ICD-10-CM | POA: Diagnosis not present

## 2017-06-08 NOTE — Telephone Encounter (Signed)
Routing to AB.  Kevin Mathis, can you get the stool results please. There are no results in epic.

## 2017-06-08 NOTE — Telephone Encounter (Signed)
Pt said that he stopped taking his uceris on the 1st of June and did fine for the first 4-5 days. Ever since he has been having watery stools and keeps going to the bathroom. He said that he took a stool sample to the lab in [place] on Friday. He would like for AB or the nurse to call him back. [phone]

## 2017-06-08 NOTE — Telephone Encounter (Signed)
Tried to call pt- LMOM to return call 

## 2017-06-08 NOTE — Telephone Encounter (Signed)
Can we find out what his questions are?

## 2017-06-08 NOTE — Telephone Encounter (Signed)
I sent a request to [facility] to get stool results

## 2017-06-09 NOTE — Telephone Encounter (Signed)
Spoke with the pt, he was doing well for 3-4 days after stopping uceris, then the watery diarrhea started again. He will have anywhere from 3-8 bms daily. He has no appetite, no pain, no N/V, no energy. He had sinus surgery 2 months ago and cant seem to get over it, he went back for a nasal bx and they told him they found ecoli in the bx. He didn't know if it had anything to do with his bowel issues or not. He is drinking fluids ok. He is wanting to know what he should do.

## 2017-06-09 NOTE — Telephone Encounter (Signed)
Tried to call pt- NA- LMOM 

## 2017-06-09 NOTE — Telephone Encounter (Signed)
I would like to try him on pancreatic enzyme therapy. He tells me he had some type of reaction with Zenpep, but he is not sure what this was. If he can't remember, then I would like to do Creon instead. He has evidence of pancreatic atrophy on imaging. Would not hurt to add enzyme therapy with meals. Is he willing to try this?

## 2017-06-09 NOTE — Telephone Encounter (Signed)
Spoke with the pt, he said he doesn't remember having a reaction to zenpep but he said he does a medication for gout and would want something that doesn't interact with it. He would like to try some samples if he can.

## 2017-06-10 DIAGNOSIS — M6281 Muscle weakness (generalized): Secondary | ICD-10-CM | POA: Diagnosis not present

## 2017-06-10 NOTE — Telephone Encounter (Signed)
Ok. Let's try Creon 36,000 unit capsules, 1 with each meal. We have samples here. We could even do 2 with meals and 1 with snacks if needed, but start with just one with each meal.

## 2017-06-10 NOTE — Telephone Encounter (Signed)
Tried to call pt- NA-LMOM that samples and instructions were at the front desk.

## 2017-06-12 DIAGNOSIS — M6281 Muscle weakness (generalized): Secondary | ICD-10-CM | POA: Diagnosis not present

## 2017-06-15 ENCOUNTER — Encounter: Payer: Self-pay | Admitting: Nurse Practitioner

## 2017-06-15 ENCOUNTER — Ambulatory Visit (INDEPENDENT_AMBULATORY_CARE_PROVIDER_SITE_OTHER): Payer: Medicare Other | Admitting: Nurse Practitioner

## 2017-06-15 VITALS — BP 96/60 | HR 68 | Ht 66.0 in | Wt 147.4 lb

## 2017-06-15 DIAGNOSIS — G629 Polyneuropathy, unspecified: Secondary | ICD-10-CM

## 2017-06-15 DIAGNOSIS — I25119 Atherosclerotic heart disease of native coronary artery with unspecified angina pectoris: Secondary | ICD-10-CM | POA: Diagnosis not present

## 2017-06-15 MED ORDER — GABAPENTIN 300 MG PO CAPS: ORAL_CAPSULE | ORAL | 1 refills | Status: DC

## 2017-06-15 NOTE — Progress Notes (Signed)
[facility]  PATIENT: Kevin Mathis DOB: January 21, 1936   REASON FOR VISIT follow-up for small fiber neuropathy, bilateral feet paresthesias HISTORY FROM: Patient    HISTORY OF PRESENT ILLNESS:HISTORY: 06/28/14 [name] is a 81 years old right-handed Caucasian male, accompanied by his wife, I saw him previously March 2014, for bilateral feet paresthesia, his primary care physician is Dr. Salome Spotted He had a past medical history of coronary artery disease, hypertension, connective tissue disease of unknown type, he also has chronic whole-body itching, has been on long-term colchicine, 0.6 mg twice a day  Previous laboratory evaluation showed a positive ANA, SSA, but the rest of the laboratory evaluation including TSH, B12, CMP, p-ANCA, CMP, protein electrophoresis, Lyme titer, ACE was normal Previous electrodiagnostic study also showed Kevin evidence of large fiber peripheral neuropathy. This has led to a skin biopsy, which showed patterns consistent with small fiber neuropathy, with decreased epidural nerve fibers at his feet, and distal leg He has been taking gabapentin 100 mg one tablet every night as needed along with Tylenol PM 2 tablets every night for his sleep, and bilateral feet paresthesia, his bilateral feet paresthesia overall has been fairly stable, he complains of numbness tingling burning cold sensation at his distal toes, plantar surface, difficulty sleeping sometimes But over the past 2 months, he seems to have increased night time bilateral feet discomfort, especially after being active during the daytime, his feet was so bothersome, sometime has difficulty sleeping, extra dose of gabapentin was helpful, but he has such sound sleep, sometimes to the point of wetting his bed,  He denies gait difficulty, denies significant low back pain, Kevin bilateral upper extremity paresthesia UPDATE 06/29/15 [name], 81 year old male returns for follow-up. He has a history of  bilateral paresthesias of the feet and length dependent small fiber peripheral neuropathy etiology unclear but could be due to his long-term use of colchicine for gout. His symptoms are fairly well controlled with gabapentin. In addition he claims he has been to the chiropractor several times for manipulation and feels that his symptoms are better because of TENS and laser therapy to feet. He returns for reevaluation UPDATE 06/16/2017CM [name], 81 year old male returns for follow-up. He has history of bilateral paresthesias of the feet and small fiber peripheral neuropathy etiology unclear. He has been on colchicine long-term for gout. He is currently taking 600 mg of gabapentin at night with continued burning and tingling sensations in his feet. He returns for reevaluation UPDATE 06/18/2018CM [name], 81 year old male returns for follow-up. He has a several year history of bilateral paresthesias of the feet and small fiber peripheral neuropathy etiology is unclear however one of Dr. Rhea Belton notes mention long-term use: Colchine for his gout. Patient tells me he has had problems with his colitis and has lost about 30 pounds in the last year. He has more weakness. Blood pressure noted to be 96/over 60. He has poor fluid intake. He claims he stays fairly active during the day but when he goes to sit down about 5:00 he starts having burning and tingling sensations in his feet. He is currently taking 600 mg of gabapentin twice a day. He returns for reevaluation   REVIEW OF SYSTEMS: Full 14 system review of systems performed and notable only for those listed, all others are neg:  Constitutional: Fatigue Cardiovascular: neg Ear/Nose/Throat: Hearing loss  Skin: neg Eyes: neg Respiratory: neg Gastroitestinal: Diarrhea  Hematology/Lymphatic: Easy bruising  Endocrine: Intolerance to cold Musculoskeletal: Walking difficulty joint pain  Allergy/Immunology: neg Neurological: Paresthesias of the feet,  weakness Psychiatric: neg Sleep : neg   ALLERGIES: Allergies  Allergen Reactions  . Cefprozil   . Doxycycline Hives and Itching  . Zenpep [Pancrelipase (Lip-Prot-Amyl)]     Patient does not recall reaction but states he had to stop because it was "bad". I have listed in allergies, as we are not able to determine what this was.   . Levofloxacin Rash    Unknown REACTION: rash  . Penicillins Rash    REACTION: hives  . Statins Other (See Comments) and Rash    aching aching    HOME MEDICATIONS: Outpatient Medications Prior to Visit  Medication Sig Dispense Refill  . amLODipine (NORVASC) 5 MG tablet TAKE 1 TABLET DAILY 90 tablet 3  . aspirin 81 MG tablet Take 1 tablet (81 mg total) by mouth every other day. (Patient taking differently: Take 81 mg by mouth daily. ) 30 tablet 3  . cevimeline (EVOXAC) 30 MG capsule Take 30 mg by mouth as needed. May add 1 tab at bedtime as needed.    . Cholecalciferol (VITAMIN D) 2000 units CAPS Take 1 capsule by mouth daily. Reported on 06/13/2016    . cholestyramine (QUESTRAN) 4 GM/DOSE powder Take 0.5 packets (2 g total) by mouth 2 (two) times daily as needed. Do not take within 4 hours of other medications. Hold for constipation. 378 g 0  . CINNAMON PO Take 3,000 mg by mouth daily. Reported on 06/13/2016    . clobetasol (TEMOVATE) 0.05 % GEL Apply topically as needed.    . colchicine 0.6 MG tablet Take 0.6 mg by mouth 2 (two) times daily.      Marland Kitchen esomeprazole (NEXIUM) 40 MG capsule Take 40 mg by mouth daily.     Marland Kitchen gabapentin (NEURONTIN) 300 MG capsule Take 2 capsules (600 mg total) by mouth 2 (two) times daily. Take 2 around 6pm and 2 at bedtime 360 capsule 1  . metoprolol tartrate (LOPRESSOR) 25 MG tablet TAKE ONE-HALF (1/2) TABLET TWICE A DAY 90 tablet 3  . Multiple Vitamin (MULTIVITAMIN) tablet Take 1 tablet by mouth daily. Reported on 06/13/2016    . nitroGLYCERIN (NITROSTAT) 0.4 MG SL tablet Place 1 tablet (0.4 mg total) under the tongue every 5  (five) minutes as needed. 25 tablet 3  . NON FORMULARY CPAP Use as directed     . omega-3 acid ethyl esters (LOVAZA) 1 g capsule TAKE 2 CAPSULES TWICE A DAY 360 capsule 3  . Probiotic Product (PROBIOTIC DAILY PO) Take 1 Can by mouth daily. Reported on 06/13/2016    . Thiamine HCl (VITAMIN B-1) 250 MG tablet Take 250 mg by mouth daily. Reported on 06/13/2016    . zinc gluconate 50 MG tablet Take 50 mg by mouth 2 (two) times daily.     . Budesonide 9 MG TB24 Take 1 tablet by mouth daily. (Patient not taking: Reported on 06/15/2017) 90 tablet 1  . vitamin B-12 (CYANOCOBALAMIN) 1000 MCG tablet Take 1,000 mcg by mouth daily. Reported on 06/13/2016     Kevin facility-administered medications prior to visit.     PAST MEDICAL HISTORY: Past Medical History:  Diagnosis Date  . Collagenous colitis   . Coronary atherosclerosis of native coronary artery    DES RCA 5/11, LVEF 55%  . Essential hypertension, benign   . GERD (gastroesophageal reflux disease)   . Hiatal hernia   . Mixed hyperlipidemia   . Obstructive sleep apnea   . Prostate cancer ([place])   .  Pulmonary nodules   . Rheumatoid arthritis ([place])     PAST SURGICAL HISTORY: Past Surgical History:  Procedure Laterality Date  . APPENDECTOMY    . COLONOSCOPY  06/2016   Dr. Britta Mccreedy: mild diverticulosis in left colon, sessile polyp 3-7 mm in distal descending colon, biopsies of colon to assess for microscopic colitis. path: collagenous colitis. colon polyp was benign polypoid colorectal mucosa   . COLONOSCOPY  04/2014   Dr. Britta Mccreedy: office notes stated 3 tubular adenomas and diverticulosis of left colon   . CORONARY ANGIOPLASTY WITH STENT PLACEMENT  2011  . ESOPHAGOGASTRODUODENOSCOPY  06/2016   Dr. Britta Mccreedy: gastritis, path: chronic active gastritis, negative H.pylori   . HEMORRHOID SURGERY    . LAPAROSCOPIC CHOLECYSTECTOMY    . PROSTATECTOMY    . Skin cancer resection      FAMILY HISTORY: Family History  Problem Relation Age of Onset  .  Colon cancer Mother 15  . Hypertension Unknown     SOCIAL HISTORY: Social History   Social History  . Marital status: Married    Spouse name: N/A  . Number of children: N/A  . Years of education: N/A   Occupational History  . Not on file.   Social History Main Topics  . Smoking status: Former Smoker    Packs/day: 1.00    Years: 40.00    Types: Cigarettes    Start date: 12/30/1951    Quit date: 12/30/1991  . Smokeless tobacco: Never Used  . Alcohol use 0.0 oz/week     Comment: 1 happy hour beer a day   . Drug use: Kevin  . Sexual activity: Not on file   Other Topics Concern  . Not on file   Social History Narrative  . Kevin narrative on file     PHYSICAL EXAM  Vitals:   06/15/17 1029  BP: 96/60  Pulse: 68  Weight: 147 lb 6.4 oz (66.9 kg)  Height: 5\' 6"  (1.676 m)   Body mass index is 23.79 kg/m. Generalized: In Kevin acute distress, frail in appearance Neck: Supple, Kevin carotid bruits  Musculoskeletal: Kevin deformity  Neurological examination  Mentation: Alert oriented to time, place, history taking, and causual conversation Cranial nerve II-XII: Pupils were equal round reactive to light. Extraocular movements were full. Visual field were full on confrontational test. Bilateral fundi were sharp. Facial sensation and strength were normal. Hearing was intact to finger rubbing bilaterally. Uvula tongue midline. Head turning and shoulder shrug and were normal and symmetric.Tongue protrusion into cheek strength was normal. Motor: Normal tone, bulk and strength. Sensory: mildly length dependent decreased fine touch, pinprick to distal leg, preserved vibratory sensation, and proprioception at toes. Coordination: Normal finger to nose, heel-to-shin bilaterally  Gait: Rising up from seated position without assistance, normal stance,  moderate stride, good arm swing, smooth turning, able to perform tiptoe, and heel walking without difficulty.  Deep tendon reflexes:  Brachioradialis 2/2, biceps 2/2, triceps 2/2, patellar 2/2, Achilles 2/2, plantar responses were flexor bilaterally.   DIAGNOSTIC DATA (LABS, IMAGING, TESTING) - ASSESSMENT AND PLAN 81 y.o. year old male has a past medical history of paresthesias of the feet and length dependent small fiber peripheral neuropathy of unknown etiology  may be long-term use of colchicine.      PLAN Increase gabapentin  3 around 4pm and 3 about 8 to 9 pm , may take additional cap if necessary Given information  on neuropathy and reviewed this with him Increase fluid intake to prevent dehydration Be careful with ambulation  to prevent falls F/U  In 6 months with Dr. Krista Blue I spent 25 minutes in total face to face time with the patient more than 50% of which was spent counseling and coordination of care, reviewing test results reviewing medications and discussing and reviewing the diagnosis of peripheral neuropathy and further treatment options such as Lyrica or Cymbalta.Rayburn Ma, [facility], APRN  [facility] [address], [place], [place] [address] [phone]

## 2017-06-15 NOTE — Progress Notes (Signed)
I have reviewed and agreed above plan. 

## 2017-06-15 NOTE — Patient Instructions (Addendum)
Take gabapentin  3 around 4pm and 3 about 8 to 9 pm , may take additional cap if necessary Given information  on neuropathy  F/U  In 6 months with Dr. Krista Kevin Mathis  Peripheral Neuropathy Peripheral neuropathy is a type of nerve damage. It affects nerves that carry signals between the spinal cord and other parts of the body. These are called peripheral nerves. With peripheral neuropathy, one nerve or a group of nerves may be damaged. What are the causes? Many things can damage peripheral nerves. For some people with peripheral neuropathy, the cause is unknown. Some causes include:  Diabetes. This is the most common cause of peripheral neuropathy.  Injury to a nerve.  Pressure or stress on a nerve that lasts a long time.  Too little vitamin B. Alcoholism can lead to this.  Infections.  Autoimmune diseases, such as multiple sclerosis and systemic lupus erythematosus.  Inherited nerve diseases.  Some medicines, such as cancer drugs.  Toxic substances, such as lead and mercury.  Too little blood flowing to the legs.  Kidney disease.  Thyroid disease.  What are the signs or symptoms? Different people have different symptoms. The symptoms you have will depend on which of your nerves is damaged. Common symptoms include:  Loss of feeling (numbness) in the feet and hands.  Tingling in the feet and hands.  Pain that burns.  Very sensitive skin.  Weakness.  Not being able to move a part of the body (paralysis).  Muscle twitching.  Clumsiness or poor coordination.  Loss of balance.  Not being able to control your bladder.  Feeling dizzy.  Sexual problems.  How is this diagnosed? Peripheral neuropathy is a symptom, not a disease. Finding the cause of peripheral neuropathy can be hard. To figure that out, your health care provider will take a medical history and do a physical exam. A neurological exam will also be done. This involves checking things affected by your brain, spinal  cord, and nerves (nervous system). For example, your health care provider will check your reflexes, how you move, and what you can feel. Other types of tests may also be ordered, such as:  Blood tests.  A test of the fluid in your spinal cord.  Imaging tests, such as CT scans or an MRI.  Electromyography (EMG). This test checks the nerves that control muscles.  Nerve conduction velocity tests. These tests check how fast messages pass through your nerves.  Nerve biopsy. A small piece of nerve is removed. It is then checked under a microscope.  How is this treated?  Medicine is often used to treat peripheral neuropathy. Medicines may include: ? Pain-relieving medicines. Prescription or over-the-counter medicine may be suggested. ? Antiseizure medicine. This may be used for pain. ? Antidepressants. These also may help ease pain from neuropathy. ? Lidocaine. This is a numbing medicine. You might wear a patch or be given a shot. ? Mexiletine. This medicine is typically used to help control irregular heart rhythms.  Surgery. Surgery may be needed to relieve pressure on a nerve or to destroy a nerve that is causing pain.  Physical therapy to help movement.  Assistive devices to help movement. Follow these instructions at home:  Only take over-the-counter or prescription medicines as directed by your health care provider. Follow the instructions carefully for any given medicines. Do not take any other medicines without first getting approval from your health care provider.  If you have diabetes, work closely with your health care provider to keep  your blood sugar under control.  If you have numbness in your feet: ? Check every day for signs of injury or infection. Watch for redness, warmth, and swelling. ? Wear padded socks and comfortable shoes. These help protect your feet.  Do not do things that put pressure on your damaged nerve.  Do not smoke. Smoking keeps blood from getting to  damaged nerves.  Avoid or limit alcohol. Too much alcohol can cause a lack of B vitamins. These vitamins are needed for healthy nerves.  Develop a good support system. Coping with peripheral neuropathy can be stressful. Talk to a mental health specialist or join a support group if you are struggling.  Follow up with your health care provider as directed. Contact a health care provider if:  You have new signs or symptoms of peripheral neuropathy.  You are struggling emotionally from dealing with peripheral neuropathy.  You have a fever. Get help right away if:  You have an injury or infection that is not healing.  You feel very dizzy or begin vomiting.  You have chest pain.  You have trouble breathing. This information is not intended to replace advice given to you by your health care provider. Make sure you discuss any questions you have with your health care provider. Document Released: 12/05/2002 Document Revised: 05/22/2016 Document Reviewed: 08/22/2013 Elsevier Interactive Patient Education  2017 [employer].

## 2017-06-18 DIAGNOSIS — M6281 Muscle weakness (generalized): Secondary | ICD-10-CM | POA: Diagnosis not present

## 2017-06-24 DIAGNOSIS — M6281 Muscle weakness (generalized): Secondary | ICD-10-CM | POA: Diagnosis not present

## 2017-06-26 DIAGNOSIS — M6281 Muscle weakness (generalized): Secondary | ICD-10-CM | POA: Diagnosis not present

## 2017-06-26 NOTE — Telephone Encounter (Signed)
Pt called- he said the samples were not working. He said he has already had 3 bms this morning. He is not loosing weight but he has no appetite and no energy. He said he felt better and was doing better when he was taking the uceris.

## 2017-06-29 DIAGNOSIS — M1990 Unspecified osteoarthritis, unspecified site: Secondary | ICD-10-CM | POA: Diagnosis not present

## 2017-06-29 DIAGNOSIS — Z6824 Body mass index (BMI) 24.0-24.9, adult: Secondary | ICD-10-CM | POA: Diagnosis not present

## 2017-06-29 MED ORDER — BUDESONIDE 9 MG PO TB24: 1.0000 | ORAL_TABLET | Freq: Every day | ORAL | 0 refills | Status: DC

## 2017-06-29 NOTE — Telephone Encounter (Signed)
Pt is aware. I have left him 2 boxes of creon at the front desk. He would like [facility]

## 2017-06-29 NOTE — Telephone Encounter (Signed)
Let's complete a full 3 month course. I sent it in . It is not entirely clear how long he was on the course previously. I want him to continue the enzymes though as well. Keep appt at end of July.

## 2017-06-29 NOTE — Addendum Note (Signed)
Addended by: Annitta Needs on: 06/29/2017 02:23 PM   Modules accepted: Orders

## 2017-06-29 NOTE — Telephone Encounter (Signed)
Completed prescription

## 2017-07-07 DIAGNOSIS — M6281 Muscle weakness (generalized): Secondary | ICD-10-CM | POA: Diagnosis not present

## 2017-07-09 DIAGNOSIS — R7989 Other specified abnormal findings of blood chemistry: Secondary | ICD-10-CM | POA: Diagnosis not present

## 2017-07-09 LAB — HEPATIC FUNCTION PANEL
ALBUMIN: 3.5 g/dL — AB (ref 3.6–5.1)
ALT: 63 U/L — ABNORMAL HIGH (ref 9–46)
AST: 76 U/L — ABNORMAL HIGH (ref 10–35)
Alkaline Phosphatase: 108 U/L (ref 40–115)
BILIRUBIN DIRECT: 0.3 mg/dL — AB (ref <=0.2)
Indirect Bilirubin: 0.6 mg/dL (ref 0.2–1.2)
Total Bilirubin: 0.9 mg/dL (ref 0.2–1.2)
Total Protein: 6.1 g/dL (ref 6.1–8.1)

## 2017-07-10 DIAGNOSIS — M6281 Muscle weakness (generalized): Secondary | ICD-10-CM | POA: Diagnosis not present

## 2017-07-13 ENCOUNTER — Telehealth: Payer: Self-pay | Admitting: Nurse Practitioner

## 2017-07-13 DIAGNOSIS — M6281 Muscle weakness (generalized): Secondary | ICD-10-CM | POA: Diagnosis not present

## 2017-07-13 NOTE — Telephone Encounter (Signed)
Spoke with patient and informed him that Daun Peacock, NP  called in refill to [facility] on 06/15/17 for 90 day supply with additional refill. Patient stated he would check with [facility]. He inquired about what his dosing is, and this RN explained. This RN advised him to call back with further needs. He verbalized understanding, appreciation.

## 2017-07-13 NOTE — Telephone Encounter (Signed)
Patient calling to get a new Rx for gabapentin (NEURONTIN) 300 MG capsule #90  with new dosage called to [facility].

## 2017-07-16 DIAGNOSIS — M6281 Muscle weakness (generalized): Secondary | ICD-10-CM | POA: Diagnosis not present

## 2017-07-20 DIAGNOSIS — M6281 Muscle weakness (generalized): Secondary | ICD-10-CM | POA: Diagnosis not present

## 2017-07-22 DIAGNOSIS — C61 Malignant neoplasm of prostate: Secondary | ICD-10-CM | POA: Diagnosis not present

## 2017-07-23 ENCOUNTER — Telehealth: Payer: Self-pay

## 2017-07-23 ENCOUNTER — Encounter: Payer: Self-pay | Admitting: Gastroenterology

## 2017-07-23 ENCOUNTER — Ambulatory Visit (INDEPENDENT_AMBULATORY_CARE_PROVIDER_SITE_OTHER): Payer: Medicare Other | Admitting: Gastroenterology

## 2017-07-23 ENCOUNTER — Other Ambulatory Visit: Payer: Self-pay

## 2017-07-23 VITALS — BP 113/73 | HR 72 | Temp 97.6°F | Ht 66.0 in | Wt 142.8 lb

## 2017-07-23 DIAGNOSIS — K529 Noninfective gastroenteritis and colitis, unspecified: Secondary | ICD-10-CM

## 2017-07-23 DIAGNOSIS — I25119 Atherosclerotic heart disease of native coronary artery with unspecified angina pectoris: Secondary | ICD-10-CM

## 2017-07-23 DIAGNOSIS — R634 Abnormal weight loss: Secondary | ICD-10-CM | POA: Diagnosis not present

## 2017-07-23 LAB — CBC WITH DIFFERENTIAL/PLATELET
BASOS PCT: 0 %
Basophils Absolute: 0 cells/uL (ref 0–200)
EOS ABS: 96 {cells}/uL (ref 15–500)
Eosinophils Relative: 1 %
HEMATOCRIT: 41.3 % (ref 38.5–50.0)
Hemoglobin: 13.8 g/dL (ref 13.2–17.1)
LYMPHS PCT: 18 %
Lymphs Abs: 1728 cells/uL (ref 850–3900)
MCH: 32.9 pg (ref 27.0–33.0)
MCHC: 33.4 g/dL (ref 32.0–36.0)
MCV: 98.6 fL (ref 80.0–100.0)
MONO ABS: 576 {cells}/uL (ref 200–950)
MONOS PCT: 6 %
NEUTROS PCT: 75 %
Neutro Abs: 7200 cells/uL (ref 1500–7800)
Platelets: 103 10*3/uL — ABNORMAL LOW (ref 140–400)
RBC: 4.19 MIL/uL — ABNORMAL LOW (ref 4.20–5.80)
RDW: 18.8 % — AB (ref 11.0–15.0)
WBC: 9.6 10*3/uL (ref 3.8–10.8)

## 2017-07-23 NOTE — Patient Instructions (Signed)
I have ordered a blood test for you.  I have referred you to [facility] for further evaluation of weight loss and diarrhea.   I would like to see you back in 8 weeks.

## 2017-07-23 NOTE — Progress Notes (Signed)
CC'D TO PCP °

## 2017-07-23 NOTE — Assessment & Plan Note (Signed)
81 year old male with persistent weight loss that is concerning. TSH normal, celiac serologies negative, CT/MRI on file, CT chest negative for occult malignancy. Colonoscopy/EGD in July 2017 at outside facility by Dr. Britta Mccreedy. Known history of collagenous colitis and on another course of Uceris currently. Seems to have baseline bowel habits of 1-2 loose/soft stools a day. No improvement with pancreatic enzymes. Some days will skip having any bowel movements. Nocturnal loose stool is concerning. Presentation does not appear consistent with mesenteric ischemia. With his persistent weight loss, chronic, intermittent diarrhea (although known collagenous colitis), I discussed tertiary care referral for further evaluation. His weight loss is notable. EGD on file from last year with Dr. Britta Mccreedy. Doubt repeating a colonoscopy would be entirely beneficial at this point. I am checking a CBC today to see if any evidence of anemia. If so, we would pursue an updated colonoscopy here. Otherwise, referral in process for tertiary evaluation due to failure to thrive.

## 2017-07-23 NOTE — Progress Notes (Signed)
Referring Provider: No ref. provider found Primary Care Physician:  [name], MD Primary GI: Dr. Gala Romney   Chief Complaint  Patient presents with  . Weight Loss    HPI:   Kevin Mathis is a 81 y.o. male presenting today with a history of  collagenous colitis in July 2017, completing course of Uceris. Last seen May 2018. Baseline stool habits are 1-2 loose stool per day. Outside CT by Dr. 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 MRI Jan 2018 to further assess liver lesion and noted benign appearance. US abdomen with elastography performed with F2/F3 score, moderately elevated transaminases with evaluation negative thus far and felt to be ETOH-related. Celiac serologies negative. TSH normal. CT chest negative.   On repeated course of Uceris now, starting June 2018. Does not feel this is as helpful as last time. Sometimes with nocturnal diarrhea.  Negative CDI. Hard to tell if Lucrezia Starch is helping but will take BID at times. No improvement with Creon.   Has continued to lose weight. April 2017 was 166. Sept 2017 154, Jan 2018 150. May 2018 146 and July 2018 142.   States his memory is not as good. Wife states he is forgetful when walking into a room but then remembers when he stops and thinks about it. Will sometimes ask 2-3 times about an upcoming appt, easily forgetting simple times.   Past Medical History:  Diagnosis Date  . Collagenous colitis   . Coronary atherosclerosis of native coronary artery    DES RCA 5/11, LVEF 55%  . Essential hypertension, benign   . GERD (gastroesophageal reflux disease)   . Hiatal hernia   . Mixed hyperlipidemia   . Obstructive sleep apnea   . Prostate cancer ([place])   .  Pulmonary nodules   . Rheumatoid arthritis [facility])     Past Surgical History:  Procedure Laterality Date  . APPENDECTOMY    . COLONOSCOPY  06/2016   Dr. Britta Mccreedy: mild diverticulosis in left colon, sessile polyp 3-7 mm in distal descending colon, biopsies of colon to assess for microscopic colitis. path: collagenous colitis. colon polyp was benign polypoid colorectal mucosa   . COLONOSCOPY  04/2014   Dr. Britta Mccreedy: office notes stated 3 tubular adenomas and diverticulosis of left colon   . CORONARY ANGIOPLASTY WITH STENT PLACEMENT  2011  . ESOPHAGOGASTRODUODENOSCOPY  06/2016   Dr. Britta Mccreedy: gastritis, path: chronic active gastritis, negative H.pylori   . HEMORRHOID SURGERY    . LAPAROSCOPIC CHOLECYSTECTOMY    . PROSTATECTOMY    . Skin cancer resection      Current Outpatient Prescriptions  Medication Sig Dispense Refill  . allopurinol (ZYLOPRIM) 100 MG tablet     . amLODipine (NORVASC) 5 MG tablet TAKE 1 TABLET DAILY 90 tablet 3  . aspirin 81 MG tablet Take 1 tablet (81 mg total) by mouth every other day. (Patient taking differently: Take 81 mg by mouth daily. ) 30 tablet 3  . Budesonide (UCERIS) 9 MG TB24 Take 1 tablet by mouth daily. 90 tablet 0  . cevimeline (EVOXAC) 30 MG capsule Take 30 mg by mouth as needed. May add 1 tab at bedtime as needed.    . Cholecalciferol (VITAMIN D) 2000 units CAPS Take 1 capsule by mouth daily. Reported  on 06/13/2016    . cholestyramine (QUESTRAN) 4 GM/DOSE powder Take 0.5 packets (2 g total) by mouth 2 (two) times daily as needed. Do not take within 4 hours of other medications. Hold for constipation. 378 g 0  . CINNAMON PO Take 3,000 mg by mouth daily. Reported on 06/13/2016    . clobetasol (TEMOVATE) 0.05 % GEL Apply topically as needed.    . colchicine 0.6 MG tablet Take 0.6 mg by mouth 2 (two) times daily.      Marland Kitchen esomeprazole (NEXIUM) 40 MG capsule Take 40 mg by mouth daily.     Marland Kitchen gabapentin (NEURONTIN) 300 MG capsule Take 3 around 4pm and 3 about 8 to 9  pm , may take additional cap if necessary 630 capsule 1  . metoprolol tartrate (LOPRESSOR) 25 MG tablet TAKE ONE-HALF (1/2) TABLET TWICE A DAY 90 tablet 3  . Multiple Vitamin (MULTIVITAMIN) tablet Take 1 tablet by mouth daily. Reported on 06/13/2016    . nitroGLYCERIN (NITROSTAT) 0.4 MG SL tablet Place 1 tablet (0.4 mg total) under the tongue every 5 (five) minutes as needed. 25 tablet 3  . NON FORMULARY CPAP Use as directed     . omega-3 acid ethyl esters (LOVAZA) 1 g capsule TAKE 2 CAPSULES TWICE A DAY 360 capsule 3  . Probiotic Product (PROBIOTIC DAILY PO) Take 1 Can by mouth daily. Reported on 06/13/2016    . Thiamine HCl (VITAMIN B-1) 250 MG tablet Take 250 mg by mouth daily. Reported on 06/13/2016    . zinc gluconate 50 MG tablet Take 50 mg by mouth 2 (two) times daily.     . Pancrelipase, Lip-Prot-Amyl, (CREON PO) Take by mouth. 1 tab TID with meals.     No current facility-administered medications for this visit.     Allergies as of 07/23/2017 - Review Complete 07/23/2017  Allergen Reaction Noted  . Cefprozil  04/17/2015  . Doxycycline Hives and Itching 03/20/2017  . Zenpep [pancrelipase (lip-prot-amyl)]  03/20/2017  . Levofloxacin Rash   . Penicillins Rash   . Statins Other (See Comments) and Rash 11/05/2011    Family History  Problem Relation Age of Onset  . Colon cancer Mother 21  . Hypertension Unknown     Social History   Social History  . Marital status: Married    Spouse name: N/A  . Number of children: N/A  . Years of education: N/A   Social History Main Topics  . Smoking status: Former Smoker    Packs/day: 1.00    Years: 40.00    Types: Cigarettes    Start date: 12/30/1951    Quit date: 12/30/1991  . Smokeless tobacco: Never Used  . Alcohol use 0.0 oz/week     Comment: 1 happy hour beer a day   . Drug use: No  . Sexual activity: Not Asked   Other Topics Concern  . None   Social History Narrative  . None    Review of Systems: As mentioned in HPI    Physical Exam: BP 113/73   Pulse 72   Temp 97.6 F (36.4 C) (Oral)   Ht 5\' 6"  (1.676 m)   Wt 142 lb 12.8 oz (64.8 kg)   BMI 23.05 kg/m  General:   Alert and oriented. No distress noted. Pleasant and cooperative.  Head:  Normocephalic and atraumatic. Eyes:  Conjuctiva clear without scleral icterus. Mouth:  Oral mucosa pink and moist. Good dentition. No lesions. Abdomen:  +BS, soft, non-tender and non-distended. No rebound or  guarding. No HSM or masses noted. Msk:  Symmetrical without gross deformities. Normal posture. Extremities:  Without edema. Neurologic:  Alert and  oriented x4;  grossly normal neurologically. Psych:  Alert and cooperative. Normal mood and affect.

## 2017-07-23 NOTE — Telephone Encounter (Signed)
Called [facility] for referral for chronic diarrhea/weight loss. Appt scheduled for 08/10/17 at 10:00am. They will be mailing pt a packet of information. Called and informed pt of appt.

## 2017-07-24 DIAGNOSIS — M6281 Muscle weakness (generalized): Secondary | ICD-10-CM | POA: Diagnosis not present

## 2017-07-28 ENCOUNTER — Encounter: Payer: Self-pay | Admitting: Gastroenterology

## 2017-07-30 ENCOUNTER — Telehealth: Payer: Self-pay | Admitting: Gastroenterology

## 2017-07-30 ENCOUNTER — Other Ambulatory Visit: Payer: Self-pay

## 2017-07-30 ENCOUNTER — Other Ambulatory Visit: Payer: Self-pay | Admitting: Gastroenterology

## 2017-07-30 DIAGNOSIS — D696 Thrombocytopenia, unspecified: Secondary | ICD-10-CM

## 2017-07-30 DIAGNOSIS — G629 Polyneuropathy, unspecified: Secondary | ICD-10-CM

## 2017-07-30 NOTE — Telephone Encounter (Signed)
Clinical Pool:  Patient desires to be referred to [facility] Immunology specialty. History of RA, chronic skin issues, neuropathy, overall decline in health with failure to thrive, unexplained weight loss.   Kevin Mathis: Also, needs repeat CBC in 4 weeks. Reason: thrombocytopenia.

## 2017-07-30 NOTE — Telephone Encounter (Signed)
Referral has been faxed.

## 2017-07-30 NOTE — Telephone Encounter (Signed)
Lab order on file. 

## 2017-08-07 ENCOUNTER — Other Ambulatory Visit: Payer: Self-pay | Admitting: Cardiology

## 2017-08-08 ENCOUNTER — Emergency Department (HOSPITAL_COMMUNITY)
Admission: EM | Admit: 2017-08-08 | Discharge: 2017-08-08 | Disposition: A | Discharge status: Home / Self Care | Payer: Medicare Other | Attending: Emergency Medicine | Admitting: Emergency Medicine

## 2017-08-08 ENCOUNTER — Emergency Department (HOSPITAL_COMMUNITY): Payer: Medicare Other

## 2017-08-08 DIAGNOSIS — Z8546 Personal history of malignant neoplasm of prostate: Secondary | ICD-10-CM | POA: Insufficient documentation

## 2017-08-08 DIAGNOSIS — Z79899 Other long term (current) drug therapy: Secondary | ICD-10-CM | POA: Insufficient documentation

## 2017-08-08 DIAGNOSIS — K52831 Collagenous colitis: Secondary | ICD-10-CM | POA: Diagnosis not present

## 2017-08-08 DIAGNOSIS — K529 Noninfective gastroenteritis and colitis, unspecified: Secondary | ICD-10-CM | POA: Diagnosis not present

## 2017-08-08 DIAGNOSIS — I2510 Atherosclerotic heart disease of native coronary artery without angina pectoris: Secondary | ICD-10-CM | POA: Insufficient documentation

## 2017-08-08 DIAGNOSIS — R7989 Other specified abnormal findings of blood chemistry: Secondary | ICD-10-CM | POA: Insufficient documentation

## 2017-08-08 DIAGNOSIS — R634 Abnormal weight loss: Secondary | ICD-10-CM | POA: Insufficient documentation

## 2017-08-08 DIAGNOSIS — R531 Weakness: Secondary | ICD-10-CM | POA: Diagnosis not present

## 2017-08-08 DIAGNOSIS — R197 Diarrhea, unspecified: Secondary | ICD-10-CM | POA: Diagnosis present

## 2017-08-08 DIAGNOSIS — Z87891 Personal history of nicotine dependence: Secondary | ICD-10-CM | POA: Insufficient documentation

## 2017-08-08 DIAGNOSIS — Z85828 Personal history of other malignant neoplasm of skin: Secondary | ICD-10-CM | POA: Diagnosis not present

## 2017-08-08 DIAGNOSIS — Z7982 Long term (current) use of aspirin: Secondary | ICD-10-CM | POA: Diagnosis not present

## 2017-08-08 DIAGNOSIS — M7989 Other specified soft tissue disorders: Secondary | ICD-10-CM | POA: Diagnosis not present

## 2017-08-08 DIAGNOSIS — I10 Essential (primary) hypertension: Secondary | ICD-10-CM | POA: Diagnosis not present

## 2017-08-08 DIAGNOSIS — R945 Abnormal results of liver function studies: Secondary | ICD-10-CM

## 2017-08-08 LAB — CBC WITH DIFFERENTIAL/PLATELET
BASOS ABS: 0 10*3/uL (ref 0.0–0.1)
Basophils Relative: 0 %
EOS PCT: 0 %
Eosinophils Absolute: 0 10*3/uL (ref 0.0–0.7)
HCT: 40.7 % (ref 39.0–52.0)
Hemoglobin: 13.6 g/dL (ref 13.0–17.0)
LYMPHS ABS: 1.5 10*3/uL (ref 0.7–4.0)
Lymphocytes Relative: 23 %
MCH: 32.6 pg (ref 26.0–34.0)
MCHC: 33.4 g/dL (ref 30.0–36.0)
MCV: 97.6 fL (ref 78.0–100.0)
MONO ABS: 0.7 10*3/uL (ref 0.1–1.0)
MONOS PCT: 10 %
Neutro Abs: 4.5 10*3/uL (ref 1.7–7.7)
Neutrophils Relative %: 67 %
PLATELETS: 127 10*3/uL — AB (ref 150–400)
RBC: 4.17 MIL/uL — ABNORMAL LOW (ref 4.22–5.81)
RDW: 18.5 % — AB (ref 11.5–15.5)
WBC: 6.7 10*3/uL (ref 4.0–10.5)

## 2017-08-08 LAB — URINALYSIS, ROUTINE W REFLEX MICROSCOPIC
BILIRUBIN URINE: NEGATIVE
Glucose, UA: NEGATIVE mg/dL
HGB URINE DIPSTICK: NEGATIVE
KETONES UR: NEGATIVE mg/dL
LEUKOCYTES UA: NEGATIVE
Nitrite: NEGATIVE
PROTEIN: NEGATIVE mg/dL
Specific Gravity, Urine: 1.021 (ref 1.005–1.030)
pH: 5 (ref 5.0–8.0)

## 2017-08-08 LAB — COMPREHENSIVE METABOLIC PANEL
ALBUMIN: 2.6 g/dL — AB (ref 3.5–5.0)
ALT: 125 U/L — ABNORMAL HIGH (ref 17–63)
ANION GAP: 8 (ref 5–15)
AST: 113 U/L — AB (ref 15–41)
Alkaline Phosphatase: 128 U/L — ABNORMAL HIGH (ref 38–126)
BILIRUBIN TOTAL: 1.4 mg/dL — AB (ref 0.3–1.2)
BUN: 17 mg/dL (ref 6–20)
CHLORIDE: 105 mmol/L (ref 101–111)
CO2: 25 mmol/L (ref 22–32)
Calcium: 8.3 mg/dL — ABNORMAL LOW (ref 8.9–10.3)
Creatinine, Ser: 0.85 mg/dL (ref 0.61–1.24)
GFR calc Af Amer: >60 mL/min (ref >60)
GFR calc non Af Amer: >60 mL/min (ref >60)
GLUCOSE: 80 mg/dL (ref 65–99)
POTASSIUM: 3.8 mmol/L (ref 3.5–5.1)
SODIUM: 138 mmol/L (ref 135–145)
Total Protein: 6.3 g/dL — ABNORMAL LOW (ref 6.5–8.1)

## 2017-08-08 LAB — I-STAT CG4 LACTIC ACID, ED
LACTIC ACID, VENOUS: 1.94 mmol/L — AB (ref 0.5–1.9)
Lactic Acid, Venous: 2.48 mmol/L (ref 0.5–1.9)

## 2017-08-08 MED ORDER — SODIUM CHLORIDE 0.9 % IV BOLUS (SEPSIS)
1000.0000 mL | Freq: Once | INTRAVENOUS | Status: AC
  Administered 2017-08-08: 1000 mL via INTRAVENOUS

## 2017-08-08 NOTE — ED Notes (Signed)
Dr.Steinl Informed of Lactic Acid results. ED-Lab

## 2017-08-08 NOTE — ED Notes (Signed)
Patient left at this time with all belongings. 

## 2017-08-08 NOTE — ED Notes (Signed)
The pt is  C/o weakness and fatigue for 1-2 weeks with weight loss.  He is not having pain

## 2017-08-08 NOTE — Discharge Instructions (Addendum)
It was our pleasure to provide your ER care today - we hope that you feel better.  Rest. Drink plenty of fluids.  Consider supplementing nutrition with shakes such as Ensure or Boost.   Follow up with your doctor/gi doctor, and doctors at [facility] this Monday as planned.  From today's lab tests, your liver function tests continue to be elevated (AST 113, ALT 125, bilirubin 1.4) - several of your medications can be associated with increased liver function tests - for now, hold your nexium and allopurinol, and follow up with your doctors this week. Discuss possible medication changes with them.   Return to ER if worse, new symptoms, fevers, new or severe abdominal pain, trouble breathing, fainting, other concern.

## 2017-08-08 NOTE — ED Provider Notes (Signed)
[facility] DEPT Provider Note   CSN: [id number] Arrival date & time: 08/08/17  1327     History   Chief Complaint Chief Complaint  Patient presents with  . collagenous collitis    HPI Kevin Mathis is a 81 y.o. male.  Patient c/o chronic diarrhea, poor appetite, wt loss, over the past several months.  Symptoms chronic, moderate, persistent.   States was dx with collagenous colitis 15 months ago. Pt states sees gi doctor for same and now has been referred to [facility].  States 40-50 lb wt loss in past 1.5 yrs.   Denies headache. No chest pain or sob. No abd pain.  No vomiting. No gu c/o.   States some days has 0 bms, other days 4-5 loose stools.    The history is provided by the patient.    Past Medical History:  Diagnosis Date  . Collagenous colitis   . Coronary atherosclerosis of native coronary artery    DES RCA 5/11, LVEF 55%  . Essential hypertension, benign   . GERD (gastroesophageal reflux disease)   . Hiatal hernia   . Mixed hyperlipidemia   . Obstructive sleep apnea   . Prostate cancer ([place])   . Pulmonary nodules   . Rheumatoid arthritis [facility])     Patient Active Problem List   Diagnosis Date Noted  . Loss of weight 12/15/2016  . Collagenous colitis 09/15/2016  . Elevated LFTs 09/15/2016  . Small fiber neuropathy 06/28/2014  . Mixed hyperlipidemia 05/24/2010  . Essential hypertension, benign 05/24/2010  . CORONARY ATHEROSCLEROSIS NATIVE CORONARY ARTERY 05/24/2010    Past Surgical History:  Procedure Laterality Date  . APPENDECTOMY    . COLONOSCOPY  06/2016   Dr. Britta Mccreedy: mild diverticulosis in left colon, sessile polyp 3-7 mm in distal descending colon, biopsies of colon to assess for microscopic colitis. path: collagenous colitis. colon polyp was benign polypoid colorectal mucosa   . COLONOSCOPY  04/2014   Dr. Britta Mccreedy: office notes stated 3 tubular adenomas and diverticulosis of left colon   . CORONARY ANGIOPLASTY WITH STENT PLACEMENT  2011  .  ESOPHAGOGASTRODUODENOSCOPY  06/2016   Dr. Britta Mccreedy: gastritis, path: chronic active gastritis, negative H.pylori   . HEMORRHOID SURGERY    . LAPAROSCOPIC CHOLECYSTECTOMY    . PROSTATECTOMY    . Skin cancer resection         Home Medications    Prior to Admission medications   Medication Sig Start Date End Date Taking? Authorizing Provider  allopurinol (ZYLOPRIM) 100 MG tablet  05/28/17   [provider]  amLODipine (NORVASC) 5 MG tablet TAKE 1 TABLET DAILY 11/26/16   Satira Sark, MD  aspirin 81 MG tablet Take 1 tablet (81 mg total) by mouth every other day. Patient taking differently: Take 81 mg by mouth daily.  04/17/15   Satira Sark, MD  Budesonide (UCERIS) 9 MG TB24 Take 1 tablet by mouth daily. 06/29/17   Annitta Needs, NP  cevimeline [facility]) 30 MG capsule Take 30 mg by mouth as needed. May add 1 tab at bedtime as needed.    [provider]  Cholecalciferol (VITAMIN D) 2000 units CAPS Take 1 capsule by mouth daily. Reported on 06/13/2016    [provider]  cholestyramine Lucrezia Starch) 4 GM/DOSE powder Take 0.5 packets (2 g total) by mouth 2 (two) times daily as needed. Do not take within 4 hours of other medications. Hold for constipation. 02/27/17   Mahala Menghini, PA-C  CINNAMON PO Take 3,000 mg  by mouth daily. Reported on 06/13/2016    [provider]  clobetasol (TEMOVATE) 0.05 % GEL Apply topically as needed.    [provider]  colchicine 0.6 MG tablet Take 0.6 mg by mouth 2 (two) times daily.      [provider]  esomeprazole (NEXIUM) 40 MG capsule Take 40 mg by mouth daily.     [provider]  gabapentin (NEURONTIN) 300 MG capsule Take 3 around 4pm and 3 about 8 to 9 pm , may take additional cap if necessary 06/15/17   Dennie Bible, NP  metoprolol tartrate (LOPRESSOR) 25 MG tablet TAKE ONE-HALF (1/2) TABLET TWICE A DAY 10/31/16   Satira Sark, MD  Multiple Vitamin (MULTIVITAMIN) tablet Take 1  tablet by mouth daily. Reported on 06/13/2016    [provider]  nitroGLYCERIN (NITROSTAT) 0.4 MG SL tablet Place 1 tablet (0.4 mg total) under the tongue every 5 (five) minutes as needed. 10/31/16   Satira Sark, MD  NON FORMULARY CPAP Use as directed     [provider]  omega-3 acid ethyl esters (LOVAZA) 1 g capsule TAKE 2 CAPSULES TWICE A DAY 08/07/17   Satira Sark, MD  Probiotic Product (PROBIOTIC DAILY PO) Take 1 Can by mouth daily. Reported on 06/13/2016    [provider]  Thiamine HCl (VITAMIN B-1) 250 MG tablet Take 250 mg by mouth daily. Reported on 06/13/2016    [provider]  zinc gluconate 50 MG tablet Take 50 mg by mouth 2 (two) times daily.     [provider]    Family History Family History  Problem Relation Age of Onset  . Colon cancer Mother 8  . Hypertension Unknown     Social History Social History  Substance Use Topics  . Smoking status: Former Smoker    Packs/day: 1.00    Years: 40.00    Types: Cigarettes    Start date: 12/30/1951    Quit date: 12/30/1991  . Smokeless tobacco: Never Used  . Alcohol use 0.0 oz/week     Comment: 1 happy hour beer a day      Allergies   Cefprozil; Doxycycline; Zenpep [pancrelipase (lip-prot-amyl)]; Levofloxacin; Penicillins; and Statins   Review of Systems Review of Systems  Constitutional: Negative for fever.  HENT: Negative for sore throat.   Eyes: Negative for visual disturbance.  Respiratory: Negative for shortness of breath.   Cardiovascular: Negative for chest pain.  Gastrointestinal: Negative for abdominal pain and vomiting.  Endocrine: Negative for polyuria.  Genitourinary: Negative for dysuria and flank pain.  Musculoskeletal: Negative for back pain and neck pain.  Skin: Negative for rash.  Neurological: Negative for headaches.  Hematological: Does not bruise/bleed easily.  Psychiatric/Behavioral: Negative for confusion.     Physical Exam Updated  Vital Signs BP 98/73 (BP Location: Right Arm)   Pulse 70   Temp (!) 97.3 F (36.3 C) (Oral)   Resp 18   SpO2 99%   Physical Exam  Constitutional: No distress.  Frail, thin appearing.   HENT:  Head: Atraumatic.  Mouth/Throat: Oropharynx is clear and moist.  Eyes: Pupils are equal, round, and reactive to light. Conjunctivae are normal.  Neck: Neck supple. No tracheal deviation present. No thyromegaly present.  No bruits.   Cardiovascular: Normal rate, regular rhythm, normal heart sounds and intact distal pulses.  Exam reveals no gallop and no friction rub.   No murmur heard. Pulmonary/Chest: Effort normal and breath sounds normal. No accessory muscle usage.  No respiratory distress.  Abdominal: Soft. Bowel sounds are normal. He exhibits no distension and no mass. There is no tenderness. There is no rebound and no guarding. No hernia.  Genitourinary:  Genitourinary Comments: No cva tenderness  Musculoskeletal: He exhibits no edema.  Neurological: He is alert.  Skin: Skin is warm and dry. No rash noted. He is not diaphoretic.  Psychiatric: He has a normal mood and affect.  Nursing note and vitals reviewed.    ED Treatments / Results  Labs (all labs ordered are listed, but only abnormal results are displayed)   Results for orders placed or performed during the hospital encounter of 08/08/17  Comprehensive metabolic panel  Result Value Ref Range   Sodium 138 135 - 145 mmol/L   Potassium 3.8 3.5 - 5.1 mmol/L   Chloride 105 101 - 111 mmol/L   CO2 25 22 - 32 mmol/L   Glucose, Bld 80 65 - 99 mg/dL   BUN 17 6 - 20 mg/dL   Creatinine, Ser 0.85 0.61 - 1.24 mg/dL   Calcium 8.3 (L) 8.9 - 10.3 mg/dL   Total Protein 6.3 (L) 6.5 - 8.1 g/dL   Albumin 2.6 (L) 3.5 - 5.0 g/dL   AST 113 (H) 15 - 41 U/L   ALT 125 (H) 17 - 63 U/L   Alkaline Phosphatase 128 (H) 38 - 126 U/L   Total Bilirubin 1.4 (H) 0.3 - 1.2 mg/dL   GFR calc non Af Amer >60 >60 mL/min   GFR calc Af Amer >60 >60 mL/min    Anion gap 8 5 - 15  CBC with Differential  Result Value Ref Range   WBC 6.7 4.0 - 10.5 K/uL   RBC 4.17 (L) 4.22 - 5.81 MIL/uL   Hemoglobin 13.6 13.0 - 17.0 g/dL   HCT 40.7 39.0 - 52.0 %   MCV 97.6 78.0 - 100.0 fL   MCH 32.6 26.0 - 34.0 pg   MCHC 33.4 30.0 - 36.0 g/dL   RDW 18.5 (H) 11.5 - 15.5 %   Platelets 127 (L) 150 - 400 K/uL   Neutrophils Relative % 67 %   Neutro Abs 4.5 1.7 - 7.7 K/uL   Lymphocytes Relative 23 %   Lymphs Abs 1.5 0.7 - 4.0 K/uL   Monocytes Relative 10 %   Monocytes Absolute 0.7 0.1 - 1.0 K/uL   Eosinophils Relative 0 %   Eosinophils Absolute 0.0 0.0 - 0.7 K/uL   Basophils Relative 0 %   Basophils Absolute 0.0 0.0 - 0.1 K/uL  Urinalysis, Routine w reflex microscopic  Result Value Ref Range   Color, Urine AMBER (A) YELLOW   APPearance HAZY (A) CLEAR   Specific Gravity, Urine 1.021 1.005 - 1.030   pH 5.0 5.0 - 8.0   Glucose, UA NEGATIVE NEGATIVE mg/dL   Hgb urine dipstick NEGATIVE NEGATIVE   Bilirubin Urine NEGATIVE NEGATIVE   Ketones, ur NEGATIVE NEGATIVE mg/dL   Protein, ur NEGATIVE NEGATIVE mg/dL   Nitrite NEGATIVE NEGATIVE   Leukocytes, UA NEGATIVE NEGATIVE  I-Stat CG4 Lactic Acid, ED  Result Value Ref Range   Lactic Acid, Venous 1.94 (H) 0.5 - 1.9 mmol/L     EKG  EKG Interpretation None       Radiology No results found.  Procedures Procedures (including critical care time)  Medications Ordered in ED Medications - No data to display   Initial Impression / Assessment and Plan / ED Course  I have reviewed the triage vital signs and the nursing notes.  Pertinent labs & imaging results that were available during my care of the patient were reviewed by me and considered in my medical decision making (see chart for details).  Iv ns bolus.   Labs.  Reviewed nursing notes and prior charts for additional history.   From labs, pt with chronic elev lfts, pt states has had mri/gi eval for same - no specific dx. Pt is noted to be on  several meds that can be associated w inc lfts - discussed w pt, and need to work w pcp/gi doctor for close f/u.   Recheck, no nvd in ED. abd soft nt.   Pt afeb.  Pt appears stable for d/c.  Has f/u arranged this Monday at [facility].    Final Clinical Impressions(s) / ED Diagnoses   Final diagnoses:  None    New Prescriptions New Prescriptions   No medications on file     Lajean Saver, MD 08/08/17 1902

## 2017-08-08 NOTE — ED Triage Notes (Signed)
Pt reports dx of collagenous collitis. Taking Uceris 9mg  a day. Pt reports increase in fatigue, decrease in appetite, improvement in diarrhea but states he feels weak. He was supposed to see a GI specialist at [facility] on Monday. Pt wife thinks he has cancer that has not been found yet. Pt states he is increasingly weak for months.

## 2017-08-10 ENCOUNTER — Ambulatory Visit (INDEPENDENT_AMBULATORY_CARE_PROVIDER_SITE_OTHER): Payer: Medicare Other | Admitting: Neurology

## 2017-08-10 ENCOUNTER — Encounter: Payer: Self-pay | Admitting: Neurology

## 2017-08-10 VITALS — BP 106/70 | HR 80 | Ht 66.0 in | Wt 137.8 lb

## 2017-08-10 DIAGNOSIS — R7989 Other specified abnormal findings of blood chemistry: Secondary | ICD-10-CM | POA: Diagnosis not present

## 2017-08-10 DIAGNOSIS — R945 Abnormal results of liver function studies: Secondary | ICD-10-CM

## 2017-08-10 DIAGNOSIS — G629 Polyneuropathy, unspecified: Secondary | ICD-10-CM | POA: Diagnosis not present

## 2017-08-10 DIAGNOSIS — I25119 Atherosclerotic heart disease of native coronary artery with unspecified angina pectoris: Secondary | ICD-10-CM | POA: Diagnosis not present

## 2017-08-10 DIAGNOSIS — R634 Abnormal weight loss: Secondary | ICD-10-CM | POA: Diagnosis not present

## 2017-08-10 DIAGNOSIS — K529 Noninfective gastroenteritis and colitis, unspecified: Secondary | ICD-10-CM | POA: Diagnosis not present

## 2017-08-10 DIAGNOSIS — K52831 Collagenous colitis: Secondary | ICD-10-CM | POA: Diagnosis not present

## 2017-08-10 MED ORDER — GABAPENTIN 300 MG PO CAPS: ORAL_CAPSULE | ORAL | 4 refills | Status: DC

## 2017-08-10 NOTE — Progress Notes (Signed)
[facility]  PATIENT: Kevin Mathis DOB: August 26, 1936   HISTORY OF PRESENT ILLNESS:HISTORY:  Kevin Mathis is a 81 years old right-handed Caucasian male, accompanied by his wife, I saw him previously March 2014, for bilateral feet paresthesia, his primary care physician is Dr. Salome Spotted He had a past medical history of coronary artery disease, hypertension, connective tissue disease of unknown type, he also has chronic whole-body itching, has been on long-term colchicine, 0.6 mg twice a day  Previous laboratory evaluation showed a positive ANA, SSA, but the rest of the laboratory evaluation including TSH, B12, CMP, p-ANCA, CMP, protein electrophoresis, Lyme titer, ACE was normal  Previous electrodiagnostic study also showed no evidence of large fiber peripheral neuropathy. This has led to a skin biopsy, which showed patterns consistent with small fiber neuropathy, with decreased epidural nerve fibers at his feet, and distal leg He has been taking gabapentin 100 mg one tablet every night as needed along with Tylenol PM 2 tablets every night for his sleep, and bilateral feet paresthesia, his bilateral feet paresthesia overall has been fairly stable, he complains of numbness tingling burning cold sensation at his distal toes, plantar surface, difficulty sleeping sometimes But over the past 2 months, he seems to have increased night time bilateral feet discomfort, especially after being active during the daytime, his feet was so bothersome, sometime has difficulty sleeping, extra dose of gabapentin was helpful, but he has such sound sleep, sometimes to the point of wetting his bed,  He denies gait difficulty, denies significant low back pain, no bilateral upper extremity paresthesia  The etiology of his small fiber peripheral neuropathy is unclear but could be due to his long-term use of colchicine for gout. His symptoms are fairly well controlled with gabapentin. In addition he  claims he has been to the chiropractor several times for manipulation and feels that his symptoms are better because of TENS and laser therapy to feet. He returns for reevaluation  Update August 10 2017: He complains of achy all over since August 2018,  He complains of worsening hearing loss  CT chest in April 2018: emphysematous change,stable small nodule opacity in the long, it has been stable since 2011, multifocal atherosclerosis,  CT head without contrast in August 2018:generalized atrophy, mild small vessel disease,  Laboratory evaluation showed mild elevated lactic acid,CBC showed hemoglobin of 13.6, elevated RDW, platelet count was mildly decreased 127,CMP showed elevated AST, ALT, alkaline phosphatase, calcium was mildly decreased 8.3, total protein was decreased 6.3,  Normal TSH December 2017, CT abdomen in July 2017, fluid filled colon, Consistent with history of diarrhea, fat density hepatic dome lesion, likely angiomyolipoma,coronary artery atherosclerosis, small hiatal hernia  MRI of abdomen in January 2018, peripheral fat containing lesions over the liver, that is stable,  REVIEW OF SYSTEMS: Full 14 system review of systems performed and notable only for those listed, all others are neg:   Appetite change, fatigue, hearing loss, runny nose, blurry vision, cough, choking, diarrhea, restless leg, urine decrease, joint pain, back pain, achy muscles, walking difficulty, neck pain  ALLERGIES: Allergies  Allergen Reactions  . Cefprozil   . Doxycycline Hives and Itching  . Zenpep [Pancrelipase (Lip-Prot-Amyl)]     Patient does not recall reaction but states he had to stop because it was "bad". I have listed in allergies, as we are not able to determine what this was.   . Levofloxacin Rash    Unknown REACTION: rash  . Penicillins Rash    REACTION: hives  .  Statins Other (See Comments) and Rash    aching aching    HOME MEDICATIONS: Outpatient Medications Prior to Visit    Medication Sig Dispense Refill  . allopurinol (ZYLOPRIM) 100 MG tablet     . amLODipine (NORVASC) 5 MG tablet TAKE 1 TABLET DAILY 90 tablet 3  . aspirin 81 MG tablet Take 1 tablet (81 mg total) by mouth every other day. (Patient taking differently: Take 81 mg by mouth daily. ) 30 tablet 3  . Budesonide (UCERIS) 9 MG TB24 Take 1 tablet by mouth daily. 90 tablet 0  . cevimeline (EVOXAC) 30 MG capsule Take 30 mg by mouth as needed. May add 1 tab at bedtime as needed.    . Cholecalciferol (VITAMIN D) 2000 units CAPS Take 1 capsule by mouth daily. Reported on 06/13/2016    . cholestyramine (QUESTRAN) 4 GM/DOSE powder Take 0.5 packets (2 g total) by mouth 2 (two) times daily as needed. Do not take within 4 hours of other medications. Hold for constipation. 378 g 0  . CINNAMON PO Take 3,000 mg by mouth daily. Reported on 06/13/2016    . clobetasol (TEMOVATE) 0.05 % GEL Apply topically as needed.    . colchicine 0.6 MG tablet Take 0.6 mg by mouth 2 (two) times daily.      Marland Kitchen gabapentin (NEURONTIN) 300 MG capsule Take 3 around 4pm and 3 about 8 to 9 pm , may take additional cap if necessary 630 capsule 1  . metoprolol tartrate (LOPRESSOR) 25 MG tablet TAKE ONE-HALF (1/2) TABLET TWICE A DAY 90 tablet 3  . Multiple Vitamin (MULTIVITAMIN) tablet Take 1 tablet by mouth daily. Reported on 06/13/2016    . nitroGLYCERIN (NITROSTAT) 0.4 MG SL tablet Place 1 tablet (0.4 mg total) under the tongue every 5 (five) minutes as needed. 25 tablet 3  . NON FORMULARY CPAP Use as directed     . omega-3 acid ethyl esters (LOVAZA) 1 g capsule TAKE 2 CAPSULES TWICE A DAY 360 capsule 3  . Probiotic Product (PROBIOTIC DAILY PO) Take 1 Can by mouth daily. Reported on 06/13/2016    . Thiamine HCl (VITAMIN B-1) 250 MG tablet Take 250 mg by mouth daily. Reported on 06/13/2016    . zinc gluconate 50 MG tablet Take 50 mg by mouth 2 (two) times daily.     Marland Kitchen esomeprazole (NEXIUM) 40 MG capsule Take 40 mg by mouth daily.      No  facility-administered medications prior to visit.     PAST MEDICAL HISTORY: Past Medical History:  Diagnosis Date  . Collagenous colitis   . Coronary atherosclerosis of native coronary artery    DES RCA 5/11, LVEF 55%  . Essential hypertension, benign   . GERD (gastroesophageal reflux disease)   . Hiatal hernia   . Mixed hyperlipidemia   . Obstructive sleep apnea   . Prostate cancer ([place])   . Pulmonary nodules   . Rheumatoid arthritis ([place])     PAST SURGICAL HISTORY: Past Surgical History:  Procedure Laterality Date  . APPENDECTOMY    . COLONOSCOPY  06/2016   Dr. Britta Mccreedy: mild diverticulosis in left colon, sessile polyp 3-7 mm in distal descending colon, biopsies of colon to assess for microscopic colitis. path: collagenous colitis. colon polyp was benign polypoid colorectal mucosa   . COLONOSCOPY  04/2014   Dr. Britta Mccreedy: office notes stated 3 tubular adenomas and diverticulosis of left colon   . CORONARY ANGIOPLASTY WITH STENT PLACEMENT  2011  . ESOPHAGOGASTRODUODENOSCOPY  06/2016  Dr. Britta Mccreedy: gastritis, path: chronic active gastritis, negative H.pylori   . HEMORRHOID SURGERY    . LAPAROSCOPIC CHOLECYSTECTOMY    . PROSTATECTOMY    . Skin cancer resection      FAMILY HISTORY: Family History  Problem Relation Age of Onset  . Colon cancer Mother 66  . Hypertension Unknown     SOCIAL HISTORY: Social History   Social History  . Marital status: Married    Spouse name: N/A  . Number of children: N/A  . Years of education: N/A   Occupational History  . Not on file.   Social History Main Topics  . Smoking status: Former Smoker    Packs/day: 1.00    Years: 40.00    Types: Cigarettes    Start date: 12/30/1951    Quit date: 12/30/1991  . Smokeless tobacco: Never Used  . Alcohol use 0.0 oz/week     Comment: 1 happy hour beer a day   . Drug use: No  . Sexual activity: Not on file   Other Topics Concern  . Not on file   Social History Narrative  . No narrative on  file     PHYSICAL EXAM  Vitals:   08/10/17 1515  BP: 106/70  Pulse: 80  Weight: 137 lb 12 oz (62.5 kg)  Height: 5\' 6"  (1.676 m)   Body mass index is 22.23 kg/m. Generalized: In no acute distress, frail in appearance Neck: Supple, no carotid bruits  Musculoskeletal: No deformity  Neurological examination  Mentation: Alert oriented to time, place, history taking, and causual conversation Cranial nerve II-XII: Pupils were equal round reactive to light. Extraocular movements were full. Visual field were full on confrontational test. Bilateral fundi were sharp. Facial sensation and strength were normal. Hearing was intact to finger rubbing bilaterally. Uvula tongue midline. Head turning and shoulder shrug and were normal and symmetric.Tongue protrusion into cheek strength was normal. Motor: Normal tone, bulk and strength. Sensory: mildly length dependent decreased fine touch, pinprick to distal leg.decreased vibratory sensation to knee level Coordination: Normal finger to nose, heel-to-shin bilaterally  Gait: Rising up from seated position without assistance, normal stance,  moderate stride, good arm swing, smooth turning, able to perform tiptoe, and heel walking without difficulty.  Deep tendon reflexes: Brachioradialis 2/2, biceps 2/2, triceps 2/2, patellar 2/2, Achilles 2/2, plantar responses were flexor bilaterally.  DIAGNOSTIC DATA (LABS, IMAGING, TESTING) - ASSESSMENT AND PLAN 81 y.o. year old male Small fiber peripheral neuropathy  Continue gabapentin 300 mg 3 tablets twice a day  Rapid weight loss, fatigue, abnormal liver functional test, colitis  Continue GI follow-up   Marcial Pacas, M.D. Ph.D.  [facility] [place], [place] [address] Phone: [phone] Fax:      [phone]

## 2017-08-11 ENCOUNTER — Encounter: Payer: Self-pay | Admitting: Gastroenterology

## 2017-08-11 DIAGNOSIS — G619 Inflammatory polyneuropathy, unspecified: Secondary | ICD-10-CM | POA: Diagnosis not present

## 2017-08-11 DIAGNOSIS — I2510 Atherosclerotic heart disease of native coronary artery without angina pectoris: Secondary | ICD-10-CM | POA: Diagnosis not present

## 2017-08-11 DIAGNOSIS — Z6822 Body mass index (BMI) 22.0-22.9, adult: Secondary | ICD-10-CM | POA: Diagnosis not present

## 2017-08-11 DIAGNOSIS — I10 Essential (primary) hypertension: Secondary | ICD-10-CM | POA: Diagnosis not present

## 2017-08-11 DIAGNOSIS — B370 Candidal stomatitis: Secondary | ICD-10-CM | POA: Diagnosis not present

## 2017-08-11 DIAGNOSIS — C61 Malignant neoplasm of prostate: Secondary | ICD-10-CM | POA: Diagnosis not present

## 2017-08-11 DIAGNOSIS — L298 Other pruritus: Secondary | ICD-10-CM | POA: Diagnosis not present

## 2017-08-11 DIAGNOSIS — D696 Thrombocytopenia, unspecified: Secondary | ICD-10-CM | POA: Diagnosis not present

## 2017-08-12 DIAGNOSIS — Z6823 Body mass index (BMI) 23.0-23.9, adult: Secondary | ICD-10-CM | POA: Diagnosis not present

## 2017-08-12 DIAGNOSIS — R531 Weakness: Secondary | ICD-10-CM | POA: Diagnosis not present

## 2017-08-12 DIAGNOSIS — M3500 Sicca syndrome, unspecified: Secondary | ICD-10-CM | POA: Diagnosis not present

## 2017-08-12 DIAGNOSIS — E790 Hyperuricemia without signs of inflammatory arthritis and tophaceous disease: Secondary | ICD-10-CM | POA: Diagnosis not present

## 2017-08-12 DIAGNOSIS — R5383 Other fatigue: Secondary | ICD-10-CM | POA: Diagnosis not present

## 2017-08-12 DIAGNOSIS — M150 Primary generalized (osteo)arthritis: Secondary | ICD-10-CM | POA: Diagnosis not present

## 2017-08-12 DIAGNOSIS — K52831 Collagenous colitis: Secondary | ICD-10-CM | POA: Diagnosis not present

## 2017-08-12 DIAGNOSIS — L439 Lichen planus, unspecified: Secondary | ICD-10-CM | POA: Diagnosis not present

## 2017-08-12 DIAGNOSIS — L299 Pruritus, unspecified: Secondary | ICD-10-CM | POA: Diagnosis not present

## 2017-08-12 DIAGNOSIS — R7989 Other specified abnormal findings of blood chemistry: Secondary | ICD-10-CM | POA: Diagnosis not present

## 2017-08-12 DIAGNOSIS — M0579 Rheumatoid arthritis with rheumatoid factor of multiple sites without organ or systems involvement: Secondary | ICD-10-CM | POA: Diagnosis not present

## 2017-08-13 ENCOUNTER — Other Ambulatory Visit: Payer: Self-pay

## 2017-08-13 ENCOUNTER — Telehealth: Payer: Self-pay

## 2017-08-13 DIAGNOSIS — D696 Thrombocytopenia, unspecified: Secondary | ICD-10-CM

## 2017-08-13 NOTE — Telephone Encounter (Signed)
Pt's wife called because he is not any better. She said that all the test are coming back fine but he is so weak he can barely walk 10 steps. She took him to the ER and they told that everything was fine. He had blood work done yesterday. She is worried about him and is not eating very much. He has a cough also. He is drink water. Urine was amber color in the in the ER and is still dark. She is worried that he is going to fall or something worst. I advise her that if anything changes to take him to the ER. She understood. Please advise

## 2017-08-14 NOTE — Telephone Encounter (Addendum)
Wife is asking what good is going to the ER since his blood work is fine. She does not understand why no one will help her with her husband. She said that everyone is wanting to tell that he is fine. He had no energy and all he can do is walk to the bathroom and come back to the Living Room. He is drinking water and some coffee.He got on the phone and said that his muscles are going away, he sounds very weak and can blearily talk. He is having trouble swallowing medications.She is very upset.  Please advise,If you can please call her and talk with her.

## 2017-08-14 NOTE — Telephone Encounter (Signed)
Spoke with wife:  For about 2 weeks, has gone downhill dramatically. Feels weak. On Uceris, anti-diarrheal agents over the counter. Diarrhea is not constant. He will have days of 6-8 loose stools, the next day nothing. Went to ED 8/11. [facility] 8/13 with recommendations to continue Uceris and follow-up in 2 months. Saw Neurology 8/13. Tuesday afternoon went to PCP. Prescribed Diflucan X 3 days. Wife states thrush is better. Saw Rheumatology Wednesday due to multiple sites of joint pain. Wife states physician thought he may have a thyroid nodule. Further blood work drawn.  Given steroid shot.  Not eating much at all. Wife is trying to encourage to eat.   Feels hoarse. Has a weak cough. No energy to eat. Some pill dysphagia noted.   Please get last office note from [facility].   I have recommended seeking medical attention if any further decline, dehydration.

## 2017-08-14 NOTE — Telephone Encounter (Signed)
I agree ED evaluation. I anticipate he would be admitted for observation. He is likely dehydrated. He was seen at [facility] on Monday. He has had an extensive evaluation here. I want to make sure he is using protein supplementation twice a day in the interim.

## 2017-08-15 ENCOUNTER — Emergency Department (HOSPITAL_COMMUNITY): Payer: Medicare Other

## 2017-08-15 ENCOUNTER — Emergency Department (HOSPITAL_COMMUNITY)
Admission: EM | Admit: 2017-08-15 | Discharge: 2017-08-15 | Disposition: A | Discharge status: Home / Self Care | Payer: Medicare Other | Attending: Emergency Medicine | Admitting: Emergency Medicine

## 2017-08-15 ENCOUNTER — Encounter (HOSPITAL_COMMUNITY): Payer: Self-pay | Admitting: *Deleted

## 2017-08-15 DIAGNOSIS — R5383 Other fatigue: Secondary | ICD-10-CM | POA: Diagnosis not present

## 2017-08-15 DIAGNOSIS — Z8546 Personal history of malignant neoplasm of prostate: Secondary | ICD-10-CM | POA: Diagnosis not present

## 2017-08-15 DIAGNOSIS — M791 Myalgia: Secondary | ICD-10-CM | POA: Diagnosis not present

## 2017-08-15 DIAGNOSIS — Z8709 Personal history of other diseases of the respiratory system: Secondary | ICD-10-CM | POA: Diagnosis not present

## 2017-08-15 DIAGNOSIS — K909 Intestinal malabsorption, unspecified: Secondary | ICD-10-CM | POA: Diagnosis not present

## 2017-08-15 DIAGNOSIS — Z87891 Personal history of nicotine dependence: Secondary | ICD-10-CM | POA: Diagnosis not present

## 2017-08-15 DIAGNOSIS — I2510 Atherosclerotic heart disease of native coronary artery without angina pectoris: Secondary | ICD-10-CM | POA: Insufficient documentation

## 2017-08-15 DIAGNOSIS — R531 Weakness: Secondary | ICD-10-CM | POA: Diagnosis not present

## 2017-08-15 DIAGNOSIS — R197 Diarrhea, unspecified: Secondary | ICD-10-CM | POA: Diagnosis not present

## 2017-08-15 DIAGNOSIS — Z79899 Other long term (current) drug therapy: Secondary | ICD-10-CM | POA: Diagnosis not present

## 2017-08-15 DIAGNOSIS — R634 Abnormal weight loss: Secondary | ICD-10-CM | POA: Diagnosis not present

## 2017-08-15 DIAGNOSIS — Z7982 Long term (current) use of aspirin: Secondary | ICD-10-CM | POA: Insufficient documentation

## 2017-08-15 DIAGNOSIS — R911 Solitary pulmonary nodule: Secondary | ICD-10-CM | POA: Diagnosis not present

## 2017-08-15 DIAGNOSIS — R1084 Generalized abdominal pain: Secondary | ICD-10-CM | POA: Diagnosis not present

## 2017-08-15 DIAGNOSIS — R109 Unspecified abdominal pain: Secondary | ICD-10-CM | POA: Diagnosis not present

## 2017-08-15 LAB — CBC
HEMATOCRIT: 40.2 % (ref 39.0–52.0)
Hemoglobin: 13.7 g/dL (ref 13.0–17.0)
MCH: 34 pg (ref 26.0–34.0)
MCHC: 34.1 g/dL (ref 30.0–36.0)
MCV: 99.8 fL (ref 78.0–100.0)
Platelets: 128 10*3/uL — ABNORMAL LOW (ref 150–400)
RBC: 4.03 MIL/uL — AB (ref 4.22–5.81)
RDW: 18.8 % — ABNORMAL HIGH (ref 11.5–15.5)
WBC: 8.5 10*3/uL (ref 4.0–10.5)

## 2017-08-15 LAB — DIFFERENTIAL
BASOS PCT: 0 %
Basophils Absolute: 0 10*3/uL (ref 0.0–0.1)
Eosinophils Absolute: 0 10*3/uL (ref 0.0–0.7)
Eosinophils Relative: 0 %
LYMPHS PCT: 15 %
Lymphs Abs: 1.3 10*3/uL (ref 0.7–4.0)
MONO ABS: 0.4 10*3/uL (ref 0.1–1.0)
MONOS PCT: 5 %
Neutro Abs: 6.8 10*3/uL (ref 1.7–7.7)
Neutrophils Relative %: 80 %

## 2017-08-15 LAB — URINALYSIS, ROUTINE W REFLEX MICROSCOPIC
Bilirubin Urine: NEGATIVE
GLUCOSE, UA: NEGATIVE mg/dL
HGB URINE DIPSTICK: NEGATIVE
Ketones, ur: NEGATIVE mg/dL
LEUKOCYTES UA: NEGATIVE
Nitrite: NEGATIVE
Protein, ur: NEGATIVE mg/dL
SPECIFIC GRAVITY, URINE: 1.01 (ref 1.005–1.030)
pH: 5 (ref 5.0–8.0)

## 2017-08-15 LAB — BASIC METABOLIC PANEL
ANION GAP: 8 (ref 5–15)
BUN: 19 mg/dL (ref 6–20)
CHLORIDE: 103 mmol/L (ref 101–111)
CO2: 26 mmol/L (ref 22–32)
Calcium: 8.3 mg/dL — ABNORMAL LOW (ref 8.9–10.3)
Creatinine, Ser: 0.78 mg/dL (ref 0.61–1.24)
GFR calc non Af Amer: >60 mL/min (ref >60)
Glucose, Bld: 127 mg/dL — ABNORMAL HIGH (ref 65–99)
POTASSIUM: 3.9 mmol/L (ref 3.5–5.1)
Sodium: 137 mmol/L (ref 135–145)

## 2017-08-15 LAB — LIPASE, BLOOD: Lipase: 31 U/L (ref 11–51)

## 2017-08-15 LAB — TSH: TSH: 0.48 u[IU]/mL (ref 0.350–4.500)

## 2017-08-15 LAB — BRAIN NATRIURETIC PEPTIDE: B NATRIURETIC PEPTIDE 5: 117 pg/mL — AB (ref 0.0–100.0)

## 2017-08-15 LAB — LACTIC ACID, PLASMA: Lactic Acid, Venous: 1.8 mmol/L (ref 0.5–1.9)

## 2017-08-15 MED ORDER — ONDANSETRON HCL 4 MG/2ML IJ SOLN
4.0000 mg | Freq: Once | INTRAMUSCULAR | Status: AC
  Administered 2017-08-15: 4 mg via INTRAVENOUS
  Filled 2017-08-15: qty 2

## 2017-08-15 MED ORDER — IOPAMIDOL (ISOVUE-300) INJECTION 61%
100.0000 mL | Freq: Once | INTRAVENOUS | Status: AC | PRN
  Administered 2017-08-15: 100 mL via INTRAVENOUS

## 2017-08-15 MED ORDER — SODIUM CHLORIDE 0.9 % IV BOLUS (SEPSIS)
1000.0000 mL | Freq: Once | INTRAVENOUS | Status: AC
  Administered 2017-08-15: 1000 mL via INTRAVENOUS

## 2017-08-15 MED ORDER — SODIUM CHLORIDE 0.9 % IV SOLN: INTRAVENOUS | Status: DC

## 2017-08-15 NOTE — ED Notes (Signed)
Pt back from CT

## 2017-08-15 NOTE — ED Notes (Signed)
Pt walked to bathroom with minimal assistance and steady gait. Pt stated that he did not want any assistance while using the bathroom. Helped pt cut depend off before sitting on toilet. Pt used call light in the bathroom a few minutes later and was found by staff on knees in the bathroom floor. Water on floor, pt stated that he slipped on the water and was unable to get off the floor. Pt was assisted to wheelchair and places back on cardiac monitor and VS. Denied hitting head, neck pain, or back pain. Doctor Rogene Houston and Susa Day charge RN notified.

## 2017-08-15 NOTE — ED Triage Notes (Signed)
Seen by PCP on Tues Dxd with yeast of the mouth Pt reports multiple complaints but weak for one week Seen recently at [facility] as well as other venues

## 2017-08-15 NOTE — ED Triage Notes (Signed)
Pt c/o generalized bodyaches and weakness x 2 weeks. Pt's wife reports pt was seen at [facility] ED last week but no reason has been found for his weakness.

## 2017-08-15 NOTE — ED Provider Notes (Signed)
[facility] DEPT Provider Note   CSN: [id number] Arrival date & time: 08/15/17  1118     History   Chief Complaint Chief Complaint  Patient presents with  . Weakness    HPI Kevin Mathis is a 81 y.o. male.  Patient with a known history of collagenous colitis. Rheumatoid arthritis and small fiber neuropathy. Patient's been suffering from these chronic illnesses for a long period of time. Starting about 2 weeks ago things get significantly worse. Patient's had marked increase in generalized weakness and decreased appetite accelerated weight loss and some right ear pain. Patient was seen in the emergency department on August 11 at [facility] hospital with the labs and head CT. Labs significant for elevated lactic acid. Head CT without any acute findings. Patient also seen this week in consultation with GI medicine at [facility] at the request of Dr. Sydell Axon whose is normal GI doctor. Patient also seen August 13 by neurology at [facility] neurology. Patient also seen by his rheumatologist this week. Extensive labs were ordered. Patient has not seen his primary care doctor since July. No one has been able to explain the accelerated symptoms. Patient's wife and patient are very concerned that something very serious is gone going.      Past Medical History:  Diagnosis Date  . Collagenous colitis   . Coronary atherosclerosis of native coronary artery    DES RCA 5/11, LVEF 55%  . Essential hypertension, benign   . GERD (gastroesophageal reflux disease)   . Hiatal hernia   . Mixed hyperlipidemia   . Obstructive sleep apnea   . Prostate cancer ([place])   . Pulmonary nodules   . Rheumatoid arthritis [facility])     Patient Active Problem List   Diagnosis Date Noted  . Loss of weight 12/15/2016  . Collagenous colitis 09/15/2016  . Elevated LFTs 09/15/2016  . Small fiber neuropathy 06/28/2014  . Mixed hyperlipidemia 05/24/2010  . Essential hypertension, benign 05/24/2010  . CORONARY  ATHEROSCLEROSIS NATIVE CORONARY ARTERY 05/24/2010    Past Surgical History:  Procedure Laterality Date  . APPENDECTOMY    . COLONOSCOPY  06/2016   Dr. Britta Mccreedy: mild diverticulosis in left colon, sessile polyp 3-7 mm in distal descending colon, biopsies of colon to assess for microscopic colitis. path: collagenous colitis. colon polyp was benign polypoid colorectal mucosa   . COLONOSCOPY  04/2014   Dr. Britta Mccreedy: office notes stated 3 tubular adenomas and diverticulosis of left colon   . CORONARY ANGIOPLASTY WITH STENT PLACEMENT  2011  . ESOPHAGOGASTRODUODENOSCOPY  06/2016   Dr. Britta Mccreedy: gastritis, path: chronic active gastritis, negative H.pylori   . HEMORRHOID SURGERY    . LAPAROSCOPIC CHOLECYSTECTOMY    . PROSTATECTOMY    . Skin cancer resection         Home Medications    Prior to Admission medications   Medication Sig Start Date End Date Taking? Authorizing Provider  amLODipine (NORVASC) 5 MG tablet TAKE 1 TABLET DAILY 11/26/16  Yes Satira Sark, MD  aspirin 81 MG tablet Take 1 tablet (81 mg total) by mouth every other day. Patient taking differently: Take 81 mg by mouth daily.  04/17/15  Yes Satira Sark, MD  Budesonide (UCERIS) 9 MG TB24 Take 1 tablet by mouth daily. 06/29/17  Yes Annitta Needs, NP  cevimeline [facility]) 30 MG capsule Take 30 mg by mouth 3 (three) times daily. May add 1 tab at bedtime as needed.   Yes [provider]  cholecalciferol (VITAMIN D) 1000 units  tablet Take 1 capsule by mouth daily. Reported on 06/13/2016   Yes [provider]  cholestyramine Lucrezia Starch) 4 GM/DOSE powder Take 0.5 packets (2 g total) by mouth 2 (two) times daily as needed. Do not take within 4 hours of other medications. Hold for constipation. 02/27/17  Yes Mahala Menghini, PA-C  CINNAMON PO Take 1,000 mg by mouth 2 (two) times daily. Reported on 06/13/2016   Yes [provider]  colchicine 0.6 MG tablet Take 1.2 mg by mouth 2 (two) times daily.    Yes  [provider]  gabapentin (NEURONTIN) 300 MG capsule Take 3 around 4pm and 3 about 8 to 9 pm , may take additional cap if necessary 08/10/17  Yes Marcial Pacas, MD  metoprolol tartrate (LOPRESSOR) 25 MG tablet TAKE ONE-HALF (1/2) TABLET TWICE A DAY 10/31/16  Yes Satira Sark, MD  Multiple Vitamin (MULTIVITAMIN) tablet Take 1 tablet by mouth daily. Reported on 06/13/2016   Yes [provider]  nitroGLYCERIN (NITROSTAT) 0.4 MG SL tablet Place 1 tablet (0.4 mg total) under the tongue every 5 (five) minutes as needed. 10/31/16  Yes Satira Sark, MD  NON FORMULARY CPAP Use as directed    Yes [provider]  omega-3 acid ethyl esters (LOVAZA) 1 g capsule TAKE 2 CAPSULES TWICE A DAY 08/07/17  Yes Satira Sark, MD  pantoprazole (PROTONIX) 40 MG tablet Take 40 mg by mouth daily. 06/16/17  Yes [provider]  Probiotic Product (PROBIOTIC DAILY PO) Take 1 Can by mouth daily. Reported on 06/13/2016   Yes [provider]  sodium chloride 0.9 % SOLN 3.75 mL with tobramycin 1.2 g SOLR 10 mg 10 mg by Intrathecal route 2 (two) times daily. Rinse both nostrils twice a day compounded at pharmacy   Yes [provider]  zinc gluconate 50 MG tablet Take 50 mg by mouth 2 (two) times daily.    Yes [provider]  allopurinol (ZYLOPRIM) 100 MG tablet  05/28/17   [provider]  clobetasol (TEMOVATE) 0.05 % GEL Apply topically as needed.    [provider]  Thiamine HCl (VITAMIN B-1) 250 MG tablet Take 250 mg by mouth daily. Reported on 06/13/2016    [provider]    Family History Family History  Problem Relation Age of Onset  . Colon cancer Mother 69  . Hypertension Unknown     Social History Social History  Substance Use Topics  . Smoking status: Former Smoker    Packs/day: 1.00    Years: 40.00    Types: Cigarettes    Start date: 12/30/1951    Quit date: 12/30/1991  . Smokeless tobacco: Never Used  .  Alcohol use 0.0 oz/week     Comment: 1 happy hour beer a day      Allergies   Cefprozil; Doxycycline; Zenpep [pancrelipase (lip-prot-amyl)]; Levofloxacin; Penicillins; and Statins   Review of Systems Review of Systems  Constitutional: Positive for fatigue. Negative for activity change, appetite change, chills, fever and unexpected weight change.  HENT: Negative for congestion.   Eyes: Negative for visual disturbance.  Respiratory: Negative for shortness of breath.   Cardiovascular: Negative for chest pain.  Gastrointestinal: Positive for diarrhea. Negative for abdominal pain and blood in stool.  Endocrine: Positive for cold intolerance.  Genitourinary: Negative for dysuria.  Musculoskeletal: Positive for arthralgias and myalgias.  Skin: Negative for rash.  Neurological: Positive for weakness. Negative for dizziness, syncope and headaches.  Hematological: Does not bruise/bleed easily.  Psychiatric/Behavioral: Negative for confusion.     Physical Exam Updated Vital Signs BP 134/90 (BP Location: Left Arm)   Pulse 83   Temp (!) 97.4 F (36.3 C) (Oral)   Resp 20   Ht 1.676 m (5\' 6" )   Wt 62.1 kg (137 lb)   SpO2 99%   BMI 22.11 kg/m   Physical Exam  Constitutional: He is oriented to person, place, and time. No distress.  Patient thinning cachectic in appearance.  HENT:  Head: Normocephalic and atraumatic.  Right Ear: External ear normal.  Left Ear: External ear normal.  Mouth/Throat: No oropharyngeal exudate.  Mucous membranes moist.  Eyes: Pupils are equal, round, and reactive to light. EOM are normal.  Neck: Normal range of motion. Neck supple. No thyromegaly present.  Cardiovascular: Normal rate, regular rhythm and normal heart sounds.   No murmur heard. Pulmonary/Chest: Effort normal and breath sounds normal. No respiratory distress.  Abdominal: Soft. Bowel sounds are normal. He exhibits no distension and no mass. There is no tenderness.  Musculoskeletal: Normal  range of motion. He exhibits no edema.  Lymphadenopathy:    He has no cervical adenopathy.  Neurological: He is alert and oriented to person, place, and time. No cranial nerve deficit or sensory deficit. He exhibits normal muscle tone. Coordination normal.  Skin: Skin is warm and dry. No rash noted.  Skin is very scaly. No specific lesions.  Nursing note and vitals reviewed.    ED Treatments / Results  Labs (all labs ordered are listed, but only abnormal results are displayed) Labs Reviewed  BASIC METABOLIC PANEL - Abnormal; Notable for the following:       Result Value   Glucose, Bld 127 (*)    Calcium 8.3 (*)    All other components within normal limits  CBC - Abnormal; Notable for the following:    RBC 4.03 (*)    RDW 18.8 (*)    Platelets 128 (*)    All other components within normal limits  BRAIN NATRIURETIC PEPTIDE - Abnormal; Notable for the following:    B Natriuretic Peptide 117.0 (*)    All other components within normal limits  CULTURE, BLOOD (ROUTINE X 2)  CULTURE, BLOOD (ROUTINE X 2)  URINALYSIS, ROUTINE W REFLEX MICROSCOPIC  LACTIC ACID, PLASMA  LIPASE, BLOOD  TSH  DIFFERENTIAL  LACTIC ACID, PLASMA    EKG  EKG Interpretation  Date/Time:  Saturday August 15 2017 11:34:10 EDT Ventricular Rate:  74 PR Interval:  146 QRS Duration: 76 QT Interval:  382 QTC Calculation: 424 R Axis:   -25 Text Interpretation:  Normal sinus rhythm with sinus arrhythmia Minimal voltage criteria for LVH, may be normal variant Borderline ECG Confirmed by Fredia Sorrow [phone]) on 08/15/2017 1:54:05 PM       Radiology Ct Chest W Contrast  Result Date: 08/15/2017 CLINICAL DATA:  Abdominal pain. EXAM: CT CHEST, ABDOMEN, AND PELVIS WITH CONTRAST TECHNIQUE: Multidetector CT imaging of the chest, abdomen and pelvis was performed following the standard protocol during bolus administration of intravenous contrast. CONTRAST:  161mL ISOVUE-300 IOPAMIDOL (ISOVUE-300) INJECTION 61%  COMPARISON:  The CT scan from July 02, 2016 is not available due to technical difficulties. FINDINGS: CT CHEST FINDINGS Cardiovascular: Atherosclerotic change is seen in the non aneurysmal thoracic aorta. No dissection. Central pulmonary artery is are normal. Coronary artery calcifications are seen. The heart is unchanged. Mediastinum/Nodes: No enlarged mediastinal, hilar, or axillary lymph nodes. Thyroid gland, trachea, and esophagus demonstrate no significant findings. Lungs/Pleura: Central airways are  normal. No pneumothorax. Emphysematous changes in the apices. Bibasilar atelectasis, right greater than left. Nodularity in the left lower lobe is unchanged since May 06, 2010. No suspicious nodules in the left upper lobe. The nodule in the right upper lobe on image 42 is stable. A nodules in the posterior right lower lobe on series 3, image 116 measures 7.4 mm today. There was a nodule in this region previously measuring 7 mm as well but it was left rounded in appearance at that time. No entirely new nodules identified. Musculoskeletal: See below CT ABDOMEN PELVIS FINDINGS Hepatobiliary: A fat containing mass in the hepatic dome is unchanged since the MRI from January 12, 2017, described as a benign etiology such as a pseudo lipoma, hepatic lipoma, or angio myelolipoma. No other liver lesions identified. Patient is status post cholecystectomy. Hepatic steatosis is noted. The portal vein is patent. Pancreas: Unremarkable. No pancreatic ductal dilatation or surrounding inflammatory changes. Spleen: A splenic lesion on image 65 is probably a cyst with an attenuation of 18 Hounsfield units. A smaller cyst is also seen. Adrenals/Urinary Tract: A left renal cyst is noted. No renal obstruction. The ureters and bladder are normal. Stomach/Bowel: The stomach is poorly distended. Prominence of the gastric wall on image 57 is similar in appearance since the 2011 chest CT and likely due to poor distention. The stomach is  otherwise normal. The small bowel is unremarkable. The colon is fluid-filled. It was also described has fluid filled on the previous 2017 CT scan. There is suggested mucosal enhancement but no wall thickening or focal adjacent stranding. The appendix has been surgically removed by report. Vascular/Lymphatic: Atherosclerotic changes are seen the non aneurysmal abdominal aorta. No adenopathy. Reproductive: The prostate has been surgically removed. Other: No free air or free fluid. A tiny fat containing ventral hernias noted. Musculoskeletal: Anterior wedging of T12 is unchanged since 2011. No other acute bony abnormalities. IMPRESSION: 1. Most of the pulmonary nodularity is stable. There is a nodule in the right base which measures similarly in the interval but is mildly more rounded in appearance on axial imaging. Recommend a six-month follow-up CT scan to ensure stability. 2. Fluid-filled colon with mild mucosal enhancement. No wall thickening or focal adjacent stranding. These findings are consistent with the history of diarrhea and were described on the previous CT scan which is not available for comparison today. 3. Atherosclerosis in the aorta. 4. Emphysematous changes in the lung apices. 5. No other acute abnormalities. Aortic Atherosclerosis (ICD10-I70.0) and Emphysema (ICD10-J43.9). Electronically Signed   By: Dorise Bullion III M.D   On: 08/15/2017 16:06   Ct Abdomen Pelvis W Contrast  Result Date: 08/15/2017 CLINICAL DATA:  Abdominal pain. EXAM: CT CHEST, ABDOMEN, AND PELVIS WITH CONTRAST TECHNIQUE: Multidetector CT imaging of the chest, abdomen and pelvis was performed following the standard protocol during bolus administration of intravenous contrast. CONTRAST:  165mL ISOVUE-300 IOPAMIDOL (ISOVUE-300) INJECTION 61% COMPARISON:  The CT scan from July 02, 2016 is not available due to technical difficulties. FINDINGS: CT CHEST FINDINGS Cardiovascular: Atherosclerotic change is seen in the non aneurysmal  thoracic aorta. No dissection. Central pulmonary artery is are normal. Coronary artery calcifications are seen. The heart is unchanged. Mediastinum/Nodes: No enlarged mediastinal, hilar, or axillary lymph nodes. Thyroid gland, trachea, and esophagus demonstrate no significant findings. Lungs/Pleura: Central airways are normal. No pneumothorax. Emphysematous changes in the apices. Bibasilar atelectasis, right greater than left. Nodularity in the left lower lobe is unchanged since May 06, 2010. No suspicious nodules in the  left upper lobe. The nodule in the right upper lobe on image 42 is stable. A nodules in the posterior right lower lobe on series 3, image 116 measures 7.4 mm today. There was a nodule in this region previously measuring 7 mm as well but it was left rounded in appearance at that time. No entirely new nodules identified. Musculoskeletal: See below CT ABDOMEN PELVIS FINDINGS Hepatobiliary: A fat containing mass in the hepatic dome is unchanged since the MRI from January 12, 2017, described as a benign etiology such as a pseudo lipoma, hepatic lipoma, or angio myelolipoma. No other liver lesions identified. Patient is status post cholecystectomy. Hepatic steatosis is noted. The portal vein is patent. Pancreas: Unremarkable. No pancreatic ductal dilatation or surrounding inflammatory changes. Spleen: A splenic lesion on image 65 is probably a cyst with an attenuation of 18 Hounsfield units. A smaller cyst is also seen. Adrenals/Urinary Tract: A left renal cyst is noted. No renal obstruction. The ureters and bladder are normal. Stomach/Bowel: The stomach is poorly distended. Prominence of the gastric wall on image 57 is similar in appearance since the 2011 chest CT and likely due to poor distention. The stomach is otherwise normal. The small bowel is unremarkable. The colon is fluid-filled. It was also described has fluid filled on the previous 2017 CT scan. There is suggested mucosal enhancement but no  wall thickening or focal adjacent stranding. The appendix has been surgically removed by report. Vascular/Lymphatic: Atherosclerotic changes are seen the non aneurysmal abdominal aorta. No adenopathy. Reproductive: The prostate has been surgically removed. Other: No free air or free fluid. A tiny fat containing ventral hernias noted. Musculoskeletal: Anterior wedging of T12 is unchanged since 2011. No other acute bony abnormalities. IMPRESSION: 1. Most of the pulmonary nodularity is stable. There is a nodule in the right base which measures similarly in the interval but is mildly more rounded in appearance on axial imaging. Recommend a six-month follow-up CT scan to ensure stability. 2. Fluid-filled colon with mild mucosal enhancement. No wall thickening or focal adjacent stranding. These findings are consistent with the history of diarrhea and were described on the previous CT scan which is not available for comparison today. 3. Atherosclerosis in the aorta. 4. Emphysematous changes in the lung apices. 5. No other acute abnormalities. Aortic Atherosclerosis (ICD10-I70.0) and Emphysema (ICD10-J43.9). Electronically Signed   By: Dorise Bullion III M.D   On: 08/15/2017 16:06    Procedures Procedures (including critical care time)  Medications Ordered in ED Medications  0.9 %  sodium chloride infusion (not administered)  ondansetron (ZOFRAN) injection 4 mg (4 mg Intravenous Given 08/15/17 1358)  sodium chloride 0.9 % bolus 1,000 mL (0 mLs Intravenous Stopped 08/15/17 1627)  iopamidol (ISOVUE-300) 61 % injection 100 mL (100 mLs Intravenous Contrast Given 08/15/17 1516)     Initial Impression / Assessment and Plan / ED Course  I have reviewed the triage vital signs and the nursing notes.  Pertinent labs & imaging results that were available during my care of the patient were reviewed by me and considered in my medical decision making (see chart for details).    Patient with a history of collagenous  colitis. History of some connective tissue disorders. But in the last 2 weeks his had significant increase in generalized body aches generalized weakness to the point where it's difficult for him to stand at times. And there is been significant weight loss initially gradual but very bad in the last 2 weeks. Patient has no appetite for  food at all. Patient has recently seen his rheumatologist also is followed by GI medicine Dr. Sydell Axon. And has recently been seen in the emergency department at [facility] on August 11. And seen by his neurologist on August 13. Followed by neurology for long-standing neuropathy. No one has been able to pinpoint the the current problem. Patient also seen in consultation by gastroenterology at [facility] this week.  Workup here extensive workup done attempting to try to identify initial problem. Patient's thyroid-stimulating hormone is within normal range so does not seem to be consistent with hypothyroidism. Patient's labs are remarkably normal. No leukocytosis electrolytes without significant abnormalities. I take acid was repeated it was elevated in the emergency department on August 11 and is now normalized. CT of chest and abdomen and pelvis was done looking for any kind of neoplastic process. Nothing evident on that.  Patient was able to ambulate to the bathroom on his own. When he sat down and try to get back up his legs got weak and he went to his knees. Does not have a hard fall did not hit his head. Patient was crawling around in the bathroom to get the help chain. Then ended up scraping his knee some secondary to that. But there was no acute injury from the fall.  Patients mentally alert. Patient on August 13 that head CT that was negative. For follow-up recommend close follow-up with GI medicine as well as consideration for MRI. Recommend patient also follow-up closely with his primary care doctor prior care doctor is not seen him since he symptoms got significantly  worse.  Based on today's workup unfortunately no indication for admission. Currently patient has something significant going on. Close outpatient follow-up will be critical.   Final Clinical Impressions(s) / ED Diagnoses   Final diagnoses:  Weakness  Diarrhea due to malabsorption  Weight loss    New Prescriptions New Prescriptions   No medications on file     Fredia Sorrow, MD 08/15/17 2042

## 2017-08-15 NOTE — ED Notes (Signed)
MD at bedside. 

## 2017-08-15 NOTE — Discharge Instructions (Signed)
Recommend follow-up with primary care doctor and close follow-up with up with gastroenterology Dr. Sydell Axon. Also would consider MRI of brain for further evaluation of the symptoms. Unfortunately a day extensive workup without any acute findings warranting admission. Return for any new or worse symptoms.

## 2017-08-15 NOTE — ED Notes (Signed)
Ice given to pt for bilateral knees.

## 2017-08-15 NOTE — ED Notes (Signed)
Pt made aware to return if symptoms worsen or if any life threatening symptoms occur.   

## 2017-08-15 NOTE — ED Notes (Signed)
Discussed need for xrays for knees for pt with Dr. Rogene Houston, none needed at this time per MD due to pt taking a "soft fall to knees" per pt.

## 2017-08-17 ENCOUNTER — Ambulatory Visit (INDEPENDENT_AMBULATORY_CARE_PROVIDER_SITE_OTHER): Payer: Medicare Other | Admitting: Nurse Practitioner

## 2017-08-17 ENCOUNTER — Encounter: Payer: Self-pay | Admitting: Nurse Practitioner

## 2017-08-17 ENCOUNTER — Telehealth: Payer: Self-pay

## 2017-08-17 ENCOUNTER — Other Ambulatory Visit: Payer: Self-pay

## 2017-08-17 VITALS — BP 112/78 | HR 81 | Temp 96.7°F | Ht 64.0 in | Wt 133.0 lb

## 2017-08-17 DIAGNOSIS — I25119 Atherosclerotic heart disease of native coronary artery with unspecified angina pectoris: Secondary | ICD-10-CM | POA: Diagnosis not present

## 2017-08-17 DIAGNOSIS — R1310 Dysphagia, unspecified: Secondary | ICD-10-CM

## 2017-08-17 DIAGNOSIS — R634 Abnormal weight loss: Secondary | ICD-10-CM | POA: Diagnosis not present

## 2017-08-17 DIAGNOSIS — K52831 Collagenous colitis: Secondary | ICD-10-CM

## 2017-08-17 NOTE — Progress Notes (Signed)
cc'ed to pcp °

## 2017-08-17 NOTE — Telephone Encounter (Signed)
Pt is here for OV with EG.

## 2017-08-17 NOTE — Telephone Encounter (Signed)
Requested records.

## 2017-08-17 NOTE — Patient Instructions (Signed)
1. We will schedule your swallowing study to evaluate your difficulty swallowing. 2. I would like you to try Pepto-Bismol chewable tablets. Take 262 mg x 3 tablets, 3 times a day. 3. Call in 1-2 weeks and let us know if this is helping her symptoms. 4. I will further discuss this situation with Dr. Gala Romney and Vicente Males. 5. We will call you with further recommendations. 6. Return for follow-up in 6 weeks. 7. We will be in touch before then.

## 2017-08-17 NOTE — Assessment & Plan Note (Signed)
The patient has continued significant weight loss including 9 pounds in the previous 3 weeks since we last saw him in our office. Persistent diarrhea and watery stools. Albumin is low at 2.6. His weight loss is likely due to his diarrhea, currently diagnosed with collagenous colitis based on colonoscopy in 2017. Further management as per above.

## 2017-08-17 NOTE — Progress Notes (Signed)
Referring Provider: [name], MD Primary Care Physician:  [name], MD Primary GI:  Dr. Gala Romney  Chief Complaint  Patient presents with  . Weight Loss    went to [facility], [facility]  . Diarrhea    HPI:   Kevin Mathis is a 81 y.o. male who presents For progressive weight loss and weakness. The patient has had an extensive GI evaluation locally. The patient recently went to [facility] gastroenterology clinic due to diarrhea. Based on the transcribed.this note it seems like his symptoms have been ongoing since a fall in July 2017. He does have rheumatoid arthritis and denies NSAIDs in general. Stool studies initially negative for C. difficile. Colonoscopy and upper endoscopy in July 2017 with a diagnosis of collagenous colitis treated with 60 days a few serous with what seemed to be a good response. Has also been tried on Questran and is not sure if this is helped. Celiac serologies negative, TSH normal. Chronically elevated transaminitis but negative liver workup with F2 to F3 fibrosis on fiber scan. He was seen in the emergency department 08/08/2017 for diarrhea at which point he noted elevated lactate and higher than normal LFT elevation. No anemia at that time. Recommended discontinuing allopurinol but has not noted a difference. Stop the SIRS 3 days prior. Rheumatologist not aware of these changes. After review of information including MRI (as per below) they recommended she complete the remainder of Uceris, NSAID avoidance, but not diet, referral to internal medicine to consolidate fragmented care (which the patient declined).   [facility] did discuss the possibility of collagenous colitis and superimposed irritable bowel syndrome.  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   Today he is accompanied by his wife. Today he states his diarrhea has never totally gone away, it improved for a while but never a "formed stool." He was off Uceris about a week and then restarted Uceris again but not as effective as previously. Has also tried Questran but it didn't help significantly. Has been using Imodium, but still with diarrhea. Is currently having about 4-8 watery stools a day. Has no appetite and his wife tries to get him to eat but doesn't have a desire to. Is having solid food and pill dysphagia as well, which is new in the last month. Has been having intermittent abdominal pain, noticed more this morning. Pain is random, no improvement after bowel movement that he can think of. Denies hematochezia, melena. Is having persistent weight loss (9 lbs in the past 3-4 weeks objectively). Is feeling more weak, feels his muscle tissue "is going." Is now requiring a walked to help him around. Denies chest pain, dyspnea, dizziness, lightheadedness, syncope, near syncope. Denies any other upper or lower GI symptoms.  Past Medical History:  Diagnosis Date  . Collagenous colitis   . Coronary atherosclerosis of native coronary artery    DES RCA 5/11, LVEF 55%  . Essential hypertension, benign   . GERD (gastroesophageal reflux disease)   . Hiatal hernia   . Mixed hyperlipidemia   . Obstructive sleep apnea   . Prostate cancer ([place])   . Pulmonary nodules   . Rheumatoid arthritis [facility])     Past Surgical History:  Procedure Laterality Date  . APPENDECTOMY    .  COLONOSCOPY  06/2016   Dr. Britta Mccreedy: mild diverticulosis in left colon, sessile polyp 3-7 mm in distal descending colon, biopsies of colon to assess for microscopic colitis. path: collagenous colitis. colon polyp was benign polypoid colorectal mucosa   . COLONOSCOPY  04/2014   Dr. Britta Mccreedy: office notes stated 3 tubular adenomas and diverticulosis of left colon   . CORONARY  ANGIOPLASTY WITH STENT PLACEMENT  2011  . ESOPHAGOGASTRODUODENOSCOPY  06/2016   Dr. Britta Mccreedy: gastritis, path: chronic active gastritis, negative H.pylori   . HEMORRHOID SURGERY    . LAPAROSCOPIC CHOLECYSTECTOMY    . PROSTATECTOMY    . Skin cancer resection      Current Outpatient Prescriptions  Medication Sig Dispense Refill  . amLODipine (NORVASC) 5 MG tablet TAKE 1 TABLET DAILY 90 tablet 3  . aspirin 81 MG tablet Take 1 tablet (81 mg total) by mouth every other day. (Patient taking differently: Take 81 mg by mouth daily. ) 30 tablet 3  . Budesonide (UCERIS) 9 MG TB24 Take 1 tablet by mouth daily. 90 tablet 0  . cevimeline (EVOXAC) 30 MG capsule Take 30 mg by mouth 3 (three) times daily. May add 1 tab at bedtime as needed.    . cholecalciferol (VITAMIN D) 1000 units tablet Take 1 capsule by mouth daily. Reported on 06/13/2016    . cholestyramine (QUESTRAN) 4 GM/DOSE powder Take 0.5 packets (2 g total) by mouth 2 (two) times daily as needed. Do not take within 4 hours of other medications. Hold for constipation. 378 g 0  . clobetasol (TEMOVATE) 0.05 % GEL Apply topically as needed.    . colchicine 0.6 MG tablet Take 1.2 mg by mouth 2 (two) times daily.     Marland Kitchen gabapentin (NEURONTIN) 300 MG capsule Take 3 around 4pm and 3 about 8 to 9 pm , may take additional cap if necessary 630 capsule 4  . metoprolol tartrate (LOPRESSOR) 25 MG tablet TAKE ONE-HALF (1/2) TABLET TWICE A DAY 90 tablet 3  . Multiple Vitamin (MULTIVITAMIN) tablet Take 1 tablet by mouth daily. Reported on 06/13/2016    . nitroGLYCERIN (NITROSTAT) 0.4 MG SL tablet Place 1 tablet (0.4 mg total) under the tongue every 5 (five) minutes as needed. 25 tablet 3  . omega-3 acid ethyl esters (LOVAZA) 1 g capsule TAKE 2 CAPSULES TWICE A DAY 360 capsule 3  . pantoprazole (PROTONIX) 40 MG tablet Take 40 mg by mouth daily.    . Probiotic Product (PROBIOTIC DAILY PO) Take 1 Can by mouth daily. Reported on 06/13/2016    . sodium chloride 0.9 % SOLN  3.75 mL with tobramycin 1.2 g SOLR 10 mg 10 mg by Intrathecal route 2 (two) times daily. Rinse both nostrils twice a day compounded at pharmacy    . Thiamine HCl (VITAMIN B-1) 250 MG tablet Take 250 mg by mouth daily. Reported on 06/13/2016    . zinc gluconate 50 MG tablet Take 50 mg by mouth 2 (two) times daily.      No current facility-administered medications for this visit.     Allergies as of 08/17/2017 - Review Complete 08/17/2017  Allergen Reaction Noted  . Cefprozil  04/17/2015  . Doxycycline Hives and Itching 03/20/2017  . Zenpep [pancrelipase (lip-prot-amyl)]  03/20/2017  . Levofloxacin Rash   . Penicillins Rash   . Statins Other (See Comments) and Rash 11/05/2011    Family History  Problem Relation Age of Onset  . Colon cancer Mother 65  . Hypertension Unknown     Social  History   Social History  . Marital status: Married    Spouse name: N/A  . Number of children: N/A  . Years of education: N/A   Social History Main Topics  . Smoking status: Former Smoker    Packs/day: 1.00    Years: 40.00    Types: Cigarettes    Start date: 12/30/1951    Quit date: 12/30/1991  . Smokeless tobacco: Never Used  . Alcohol use 0.0 oz/week     Comment: 1 happy hour beer a day   . Drug use: No  . Sexual activity: Not Asked   Other Topics Concern  . None   Social History Narrative  . None    Review of Systems: General: Negative for anorexia, weight loss, fever, chills, fatigue, weakness. Eyes: Negative for vision changes.  ENT: Negative for hoarseness, difficulty swallowing , nasal congestion. CV: Negative for chest pain, angina, palpitations, dyspnea on exertion, peripheral edema.  Respiratory: Negative for dyspnea at rest, dyspnea on exertion, cough, sputum, wheezing.  GI: See history of present illness. GU:  Negative for dysuria, hematuria, urinary incontinence, urinary frequency, nocturnal urination.  MS: Negative for joint pain, low back pain.  Derm: Negative for rash  or itching.  Neuro: Negative for weakness, abnormal sensation, seizure, frequent headaches, memory loss, confusion.  Psych: Negative for anxiety, depression, suicidal ideation, hallucinations.  Endo: Negative for unusual weight change.  Heme: Negative for bruising or bleeding. Allergy: Negative for rash or hives.   Physical Exam: BP 112/78   Pulse 81   Temp (!) 96.7 F (35.9 C) (Oral)   Ht 5\' 4"  (1.626 m)   Wt 133 lb (60.3 kg)   BMI 22.83 kg/m  General:   Alert and oriented. Pleasant and cooperative. Well-nourished and well-developed.  Head:  Normocephalic and atraumatic. Eyes:  Without icterus, sclera clear and conjunctiva pink.  Ears:  Normal auditory acuity. Mouth:  No deformity or lesions, oral mucosa pink.  Throat/Neck:  Supple, without mass or thyromegaly. Cardiovascular:  S1, S2 present without murmurs appreciated. Normal pulses noted. Extremities without clubbing or edema. Respiratory:  Clear to auscultation bilaterally. No wheezes, rales, or rhonchi. No distress.  Gastrointestinal:  +BS, soft, non-tender and non-distended. No HSM noted. No guarding or rebound. No masses appreciated.  Rectal:  Deferred  Musculoskalatal:  Symmetrical without gross deformities. Normal posture. Skin:  Intact without significant lesions or rashes. Neurologic:  Alert and oriented x4;  grossly normal neurologically. Psych:  Alert and cooperative. Normal mood and affect. Heme/Lymph/Immune: No significant cervical adenopathy. No excessive bruising noted.    08/17/2017 11:03 AM   Disclaimer: This note was dictated with voice recognition software. Similar sounding words can inadvertently be transcribed and may not be corrected upon review.

## 2017-08-17 NOTE — Assessment & Plan Note (Signed)
The patient was diagnosed with collagenous colitis in 2017. He initially improved on Uceris and was attempted to wean down but then had a recurrence of his symptoms. He is now not responding very well. He was seen by [facility] recommended that diet, continue Uceris. He is due to follow up with them in a couple months. Review of the literature in up-to-date found addition of bismuth subsalicylate to budesonide in patients who have previously failed to respond to budesonide has resulted in symptom improvement in 11 of 13 patient's and resolution of colitis and 9 of 13 patients in an open label study. Given his frustration and no improvement with significant workup I will try this at this time including Pepto-Bismol 262 mg chewable tablets, 3 tabs, 3 times a day for 2 weeks. They're to call us in 1-2 weeks with a progress report.  I will further discuss this patient's case with Roseanne Kaufman and Dr. Gala Romney for further recommendations.

## 2017-08-17 NOTE — Assessment & Plan Note (Signed)
The patient is now having new onset dysphagia of solid foods and pills. I will proceed with barium pill esophagram to further evaluate in the least invasive way possible at this time. Return for follow-up in 6 weeks.

## 2017-08-17 NOTE — Telephone Encounter (Signed)
pts wife called this morning, pt is worsening. Went to the ED on Saturday and was sent home. We have given him an appt here at 9:30. pts wife said she will try to get him here on time. She has to help him get dressed and she said she has a hard time getting him up and out of the house, but she will try.   Manuela Schwartz, please get records from [facility] asap.

## 2017-08-17 NOTE — Telephone Encounter (Signed)
DG Esophagus scheduled for 08/21/17 at 9:00am, pt to arrive at 8:45am. NPO for 4 hours before test. Called and informed pt's wife.

## 2017-08-18 ENCOUNTER — Encounter: Payer: Self-pay | Admitting: Internal Medicine

## 2017-08-20 DIAGNOSIS — J384 Edema of larynx: Secondary | ICD-10-CM | POA: Diagnosis not present

## 2017-08-20 DIAGNOSIS — J329 Chronic sinusitis, unspecified: Secondary | ICD-10-CM | POA: Diagnosis not present

## 2017-08-20 DIAGNOSIS — J343 Hypertrophy of nasal turbinates: Secondary | ICD-10-CM | POA: Diagnosis not present

## 2017-08-20 DIAGNOSIS — Z7289 Other problems related to lifestyle: Secondary | ICD-10-CM | POA: Diagnosis not present

## 2017-08-20 DIAGNOSIS — Z87891 Personal history of nicotine dependence: Secondary | ICD-10-CM | POA: Diagnosis not present

## 2017-08-20 DIAGNOSIS — R490 Dysphonia: Secondary | ICD-10-CM | POA: Diagnosis not present

## 2017-08-20 LAB — CULTURE, BLOOD (ROUTINE X 2)
CULTURE: NO GROWTH
Culture: NO GROWTH
SPECIAL REQUESTS: ADEQUATE

## 2017-08-21 ENCOUNTER — Ambulatory Visit (HOSPITAL_COMMUNITY): Payer: Medicare Other

## 2017-08-21 DIAGNOSIS — R197 Diarrhea, unspecified: Secondary | ICD-10-CM | POA: Diagnosis not present

## 2017-08-21 DIAGNOSIS — Z6820 Body mass index (BMI) 20.0-20.9, adult: Secondary | ICD-10-CM | POA: Diagnosis not present

## 2017-08-21 DIAGNOSIS — E785 Hyperlipidemia, unspecified: Secondary | ICD-10-CM | POA: Diagnosis present

## 2017-08-21 DIAGNOSIS — Z9989 Dependence on other enabling machines and devices: Secondary | ICD-10-CM | POA: Diagnosis not present

## 2017-08-21 DIAGNOSIS — E8809 Other disorders of plasma-protein metabolism, not elsewhere classified: Secondary | ICD-10-CM | POA: Diagnosis not present

## 2017-08-21 DIAGNOSIS — I998 Other disorder of circulatory system: Secondary | ICD-10-CM | POA: Diagnosis not present

## 2017-08-21 DIAGNOSIS — M25561 Pain in right knee: Secondary | ICD-10-CM | POA: Diagnosis not present

## 2017-08-21 DIAGNOSIS — M25462 Effusion, left knee: Secondary | ICD-10-CM | POA: Diagnosis not present

## 2017-08-21 DIAGNOSIS — E43 Unspecified severe protein-calorie malnutrition: Secondary | ICD-10-CM | POA: Diagnosis present

## 2017-08-21 DIAGNOSIS — R490 Dysphonia: Secondary | ICD-10-CM | POA: Diagnosis present

## 2017-08-21 DIAGNOSIS — D649 Anemia, unspecified: Secondary | ICD-10-CM | POA: Diagnosis not present

## 2017-08-21 DIAGNOSIS — R531 Weakness: Secondary | ICD-10-CM | POA: Diagnosis not present

## 2017-08-21 DIAGNOSIS — M8580 Other specified disorders of bone density and structure, unspecified site: Secondary | ICD-10-CM | POA: Diagnosis not present

## 2017-08-21 DIAGNOSIS — R1032 Left lower quadrant pain: Secondary | ICD-10-CM | POA: Diagnosis not present

## 2017-08-21 DIAGNOSIS — M6258 Muscle wasting and atrophy, not elsewhere classified, other site: Secondary | ICD-10-CM | POA: Diagnosis not present

## 2017-08-21 DIAGNOSIS — K52831 Collagenous colitis: Secondary | ICD-10-CM | POA: Diagnosis not present

## 2017-08-21 DIAGNOSIS — C679 Malignant neoplasm of bladder, unspecified: Secondary | ICD-10-CM | POA: Diagnosis not present

## 2017-08-21 DIAGNOSIS — R292 Abnormal reflex: Secondary | ICD-10-CM | POA: Diagnosis not present

## 2017-08-21 DIAGNOSIS — M359 Systemic involvement of connective tissue, unspecified: Secondary | ICD-10-CM | POA: Diagnosis present

## 2017-08-21 DIAGNOSIS — G5690 Unspecified mononeuropathy of unspecified upper limb: Secondary | ICD-10-CM | POA: Diagnosis not present

## 2017-08-21 DIAGNOSIS — R918 Other nonspecific abnormal finding of lung field: Secondary | ICD-10-CM | POA: Diagnosis not present

## 2017-08-21 DIAGNOSIS — M6250 Muscle wasting and atrophy, not elsewhere classified, unspecified site: Secondary | ICD-10-CM | POA: Diagnosis present

## 2017-08-21 DIAGNOSIS — E872 Acidosis: Secondary | ICD-10-CM | POA: Diagnosis present

## 2017-08-21 DIAGNOSIS — R633 Feeding difficulties: Secondary | ICD-10-CM | POA: Diagnosis not present

## 2017-08-21 DIAGNOSIS — K529 Noninfective gastroenteritis and colitis, unspecified: Secondary | ICD-10-CM | POA: Diagnosis not present

## 2017-08-21 DIAGNOSIS — M7989 Other specified soft tissue disorders: Secondary | ICD-10-CM | POA: Diagnosis not present

## 2017-08-21 DIAGNOSIS — K219 Gastro-esophageal reflux disease without esophagitis: Secondary | ICD-10-CM | POA: Diagnosis present

## 2017-08-21 DIAGNOSIS — M1712 Unilateral primary osteoarthritis, left knee: Secondary | ICD-10-CM | POA: Diagnosis not present

## 2017-08-21 DIAGNOSIS — Z7982 Long term (current) use of aspirin: Secondary | ICD-10-CM | POA: Diagnosis not present

## 2017-08-21 DIAGNOSIS — G7289 Other specified myopathies: Secondary | ICD-10-CM | POA: Diagnosis not present

## 2017-08-21 DIAGNOSIS — I10 Essential (primary) hypertension: Secondary | ICD-10-CM | POA: Diagnosis present

## 2017-08-21 DIAGNOSIS — K449 Diaphragmatic hernia without obstruction or gangrene: Secondary | ICD-10-CM | POA: Diagnosis not present

## 2017-08-21 DIAGNOSIS — R627 Adult failure to thrive: Secondary | ICD-10-CM | POA: Diagnosis present

## 2017-08-21 DIAGNOSIS — R5381 Other malaise: Secondary | ICD-10-CM | POA: Diagnosis present

## 2017-08-21 DIAGNOSIS — R1310 Dysphagia, unspecified: Secondary | ICD-10-CM | POA: Diagnosis not present

## 2017-08-21 DIAGNOSIS — I2510 Atherosclerotic heart disease of native coronary artery without angina pectoris: Secondary | ICD-10-CM | POA: Diagnosis present

## 2017-08-21 DIAGNOSIS — E46 Unspecified protein-calorie malnutrition: Secondary | ICD-10-CM | POA: Diagnosis not present

## 2017-08-21 DIAGNOSIS — R7989 Other specified abnormal findings of blood chemistry: Secondary | ICD-10-CM | POA: Diagnosis present

## 2017-08-21 DIAGNOSIS — G629 Polyneuropathy, unspecified: Secondary | ICD-10-CM | POA: Diagnosis present

## 2017-08-21 DIAGNOSIS — R0902 Hypoxemia: Secondary | ICD-10-CM | POA: Diagnosis not present

## 2017-08-21 DIAGNOSIS — E878 Other disorders of electrolyte and fluid balance, not elsewhere classified: Secondary | ICD-10-CM | POA: Diagnosis not present

## 2017-08-21 DIAGNOSIS — G709 Myoneural disorder, unspecified: Secondary | ICD-10-CM | POA: Diagnosis not present

## 2017-08-21 DIAGNOSIS — M6281 Muscle weakness (generalized): Secondary | ICD-10-CM | POA: Diagnosis not present

## 2017-08-21 DIAGNOSIS — M109 Gout, unspecified: Secondary | ICD-10-CM | POA: Diagnosis present

## 2017-08-21 DIAGNOSIS — K117 Disturbances of salivary secretion: Secondary | ICD-10-CM | POA: Diagnosis not present

## 2017-08-21 DIAGNOSIS — N015 Rapidly progressive nephritic syndrome with diffuse mesangiocapillary glomerulonephritis: Secondary | ICD-10-CM | POA: Diagnosis not present

## 2017-08-21 DIAGNOSIS — R000 Tachycardia, unspecified: Secondary | ICD-10-CM | POA: Diagnosis not present

## 2017-08-21 DIAGNOSIS — Z7409 Other reduced mobility: Secondary | ICD-10-CM | POA: Diagnosis not present

## 2017-08-21 DIAGNOSIS — G9009 Other idiopathic peripheral autonomic neuropathy: Secondary | ICD-10-CM | POA: Diagnosis not present

## 2017-08-21 DIAGNOSIS — R4189 Other symptoms and signs involving cognitive functions and awareness: Secondary | ICD-10-CM | POA: Diagnosis not present

## 2017-08-21 DIAGNOSIS — R1312 Dysphagia, oropharyngeal phase: Secondary | ICD-10-CM | POA: Diagnosis present

## 2017-08-21 DIAGNOSIS — R748 Abnormal levels of other serum enzymes: Secondary | ICD-10-CM | POA: Diagnosis not present

## 2017-08-21 DIAGNOSIS — Z955 Presence of coronary angioplasty implant and graft: Secondary | ICD-10-CM | POA: Diagnosis not present

## 2017-08-21 DIAGNOSIS — M069 Rheumatoid arthritis, unspecified: Secondary | ICD-10-CM | POA: Diagnosis present

## 2017-08-21 DIAGNOSIS — E876 Hypokalemia: Secondary | ICD-10-CM | POA: Diagnosis not present

## 2017-08-21 DIAGNOSIS — D518 Other vitamin B12 deficiency anemias: Secondary | ICD-10-CM | POA: Diagnosis not present

## 2017-08-21 DIAGNOSIS — M25562 Pain in left knee: Secondary | ICD-10-CM | POA: Diagnosis not present

## 2017-08-21 DIAGNOSIS — G4733 Obstructive sleep apnea (adult) (pediatric): Secondary | ICD-10-CM | POA: Diagnosis present

## 2017-08-21 DIAGNOSIS — R740 Nonspecific elevation of levels of transaminase and lactic acid dehydrogenase [LDH]: Secondary | ICD-10-CM | POA: Diagnosis present

## 2017-08-21 DIAGNOSIS — R269 Unspecified abnormalities of gait and mobility: Secondary | ICD-10-CM | POA: Diagnosis present

## 2017-08-21 DIAGNOSIS — Z931 Gastrostomy status: Secondary | ICD-10-CM | POA: Diagnosis not present

## 2017-08-21 DIAGNOSIS — G6289 Other specified polyneuropathies: Secondary | ICD-10-CM | POA: Diagnosis not present

## 2017-08-21 DIAGNOSIS — G7089 Other specified myoneural disorders: Secondary | ICD-10-CM | POA: Diagnosis not present

## 2017-08-21 DIAGNOSIS — R4181 Age-related cognitive decline: Secondary | ICD-10-CM | POA: Diagnosis not present

## 2017-08-26 ENCOUNTER — Telehealth: Payer: Self-pay | Admitting: Internal Medicine

## 2017-08-26 NOTE — Telephone Encounter (Signed)
Noted. Thanks for the update.  

## 2017-08-26 NOTE — Telephone Encounter (Signed)
Noted  

## 2017-08-26 NOTE — Telephone Encounter (Signed)
Communication noted.  

## 2017-08-26 NOTE — Telephone Encounter (Signed)
Pt's wife called to cancel OV with [facility] in Sept. She said that Kevin Mathis was in [facility] and he will be getting a feeding tube and will go to a rehab facility afterwards. She will keep [place] posted.

## 2017-08-27 ENCOUNTER — Encounter: Payer: Self-pay | Admitting: Gastroenterology

## 2017-08-28 DIAGNOSIS — R7401 Elevation of levels of liver transaminase levels: Secondary | ICD-10-CM | POA: Insufficient documentation

## 2017-08-28 DIAGNOSIS — M069 Rheumatoid arthritis, unspecified: Secondary | ICD-10-CM | POA: Insufficient documentation

## 2017-08-28 DIAGNOSIS — Z9989 Dependence on other enabling machines and devices: Secondary | ICD-10-CM

## 2017-08-28 DIAGNOSIS — R740 Nonspecific elevation of levels of transaminase and lactic acid dehydrogenase [LDH]: Secondary | ICD-10-CM

## 2017-08-28 DIAGNOSIS — G4733 Obstructive sleep apnea (adult) (pediatric): Secondary | ICD-10-CM | POA: Insufficient documentation

## 2017-09-05 DIAGNOSIS — K7460 Unspecified cirrhosis of liver: Secondary | ICD-10-CM | POA: Diagnosis not present

## 2017-09-05 DIAGNOSIS — M069 Rheumatoid arthritis, unspecified: Secondary | ICD-10-CM | POA: Diagnosis not present

## 2017-09-05 DIAGNOSIS — Z7409 Other reduced mobility: Secondary | ICD-10-CM | POA: Diagnosis not present

## 2017-09-05 DIAGNOSIS — I2510 Atherosclerotic heart disease of native coronary artery without angina pectoris: Secondary | ICD-10-CM | POA: Diagnosis not present

## 2017-09-05 DIAGNOSIS — R7989 Other specified abnormal findings of blood chemistry: Secondary | ICD-10-CM | POA: Diagnosis not present

## 2017-09-05 DIAGNOSIS — G629 Polyneuropathy, unspecified: Secondary | ICD-10-CM | POA: Diagnosis not present

## 2017-09-05 DIAGNOSIS — K52831 Collagenous colitis: Secondary | ICD-10-CM | POA: Diagnosis not present

## 2017-09-05 DIAGNOSIS — K117 Disturbances of salivary secretion: Secondary | ICD-10-CM | POA: Diagnosis not present

## 2017-09-05 DIAGNOSIS — G7089 Other specified myoneural disorders: Secondary | ICD-10-CM | POA: Diagnosis not present

## 2017-09-05 DIAGNOSIS — R601 Generalized edema: Secondary | ICD-10-CM | POA: Diagnosis not present

## 2017-09-05 DIAGNOSIS — R1310 Dysphagia, unspecified: Secondary | ICD-10-CM | POA: Diagnosis not present

## 2017-09-09 DIAGNOSIS — R1310 Dysphagia, unspecified: Secondary | ICD-10-CM | POA: Diagnosis not present

## 2017-09-09 DIAGNOSIS — M069 Rheumatoid arthritis, unspecified: Secondary | ICD-10-CM | POA: Diagnosis not present

## 2017-09-09 DIAGNOSIS — G709 Myoneural disorder, unspecified: Secondary | ICD-10-CM | POA: Diagnosis not present

## 2017-09-09 DIAGNOSIS — E8809 Other disorders of plasma-protein metabolism, not elsewhere classified: Secondary | ICD-10-CM | POA: Diagnosis not present

## 2017-09-09 DIAGNOSIS — Z7409 Other reduced mobility: Secondary | ICD-10-CM | POA: Diagnosis not present

## 2017-09-09 DIAGNOSIS — Z7982 Long term (current) use of aspirin: Secondary | ICD-10-CM | POA: Diagnosis not present

## 2017-09-09 DIAGNOSIS — D649 Anemia, unspecified: Secondary | ICD-10-CM | POA: Diagnosis not present

## 2017-09-09 DIAGNOSIS — R740 Nonspecific elevation of levels of transaminase and lactic acid dehydrogenase [LDH]: Secondary | ICD-10-CM | POA: Diagnosis not present

## 2017-09-09 DIAGNOSIS — I2510 Atherosclerotic heart disease of native coronary artery without angina pectoris: Secondary | ICD-10-CM | POA: Diagnosis not present

## 2017-09-09 DIAGNOSIS — G629 Polyneuropathy, unspecified: Secondary | ICD-10-CM | POA: Diagnosis not present

## 2017-09-09 DIAGNOSIS — K117 Disturbances of salivary secretion: Secondary | ICD-10-CM | POA: Diagnosis not present

## 2017-09-10 DIAGNOSIS — Z7982 Long term (current) use of aspirin: Secondary | ICD-10-CM | POA: Diagnosis not present

## 2017-09-10 DIAGNOSIS — R1310 Dysphagia, unspecified: Secondary | ICD-10-CM | POA: Diagnosis not present

## 2017-09-10 DIAGNOSIS — G629 Polyneuropathy, unspecified: Secondary | ICD-10-CM | POA: Diagnosis not present

## 2017-09-10 DIAGNOSIS — I2510 Atherosclerotic heart disease of native coronary artery without angina pectoris: Secondary | ICD-10-CM | POA: Diagnosis not present

## 2017-09-10 DIAGNOSIS — D649 Anemia, unspecified: Secondary | ICD-10-CM | POA: Diagnosis not present

## 2017-09-10 DIAGNOSIS — E8809 Other disorders of plasma-protein metabolism, not elsewhere classified: Secondary | ICD-10-CM | POA: Diagnosis not present

## 2017-09-10 DIAGNOSIS — G709 Myoneural disorder, unspecified: Secondary | ICD-10-CM | POA: Diagnosis not present

## 2017-09-10 DIAGNOSIS — R740 Nonspecific elevation of levels of transaminase and lactic acid dehydrogenase [LDH]: Secondary | ICD-10-CM | POA: Diagnosis not present

## 2017-09-10 DIAGNOSIS — Z7409 Other reduced mobility: Secondary | ICD-10-CM | POA: Diagnosis not present

## 2017-09-10 DIAGNOSIS — M069 Rheumatoid arthritis, unspecified: Secondary | ICD-10-CM | POA: Diagnosis not present

## 2017-09-10 DIAGNOSIS — K117 Disturbances of salivary secretion: Secondary | ICD-10-CM | POA: Diagnosis not present

## 2017-09-11 DIAGNOSIS — I2510 Atherosclerotic heart disease of native coronary artery without angina pectoris: Secondary | ICD-10-CM | POA: Diagnosis not present

## 2017-09-11 DIAGNOSIS — K117 Disturbances of salivary secretion: Secondary | ICD-10-CM | POA: Diagnosis not present

## 2017-09-11 DIAGNOSIS — M069 Rheumatoid arthritis, unspecified: Secondary | ICD-10-CM | POA: Diagnosis not present

## 2017-09-11 DIAGNOSIS — D649 Anemia, unspecified: Secondary | ICD-10-CM | POA: Diagnosis not present

## 2017-09-11 DIAGNOSIS — E8809 Other disorders of plasma-protein metabolism, not elsewhere classified: Secondary | ICD-10-CM | POA: Diagnosis not present

## 2017-09-11 DIAGNOSIS — G709 Myoneural disorder, unspecified: Secondary | ICD-10-CM | POA: Diagnosis not present

## 2017-09-11 DIAGNOSIS — R05 Cough: Secondary | ICD-10-CM | POA: Diagnosis not present

## 2017-09-11 DIAGNOSIS — R1310 Dysphagia, unspecified: Secondary | ICD-10-CM | POA: Diagnosis not present

## 2017-09-11 DIAGNOSIS — R740 Nonspecific elevation of levels of transaminase and lactic acid dehydrogenase [LDH]: Secondary | ICD-10-CM | POA: Diagnosis not present

## 2017-09-11 DIAGNOSIS — G629 Polyneuropathy, unspecified: Secondary | ICD-10-CM | POA: Diagnosis not present

## 2017-09-11 DIAGNOSIS — Z7982 Long term (current) use of aspirin: Secondary | ICD-10-CM | POA: Diagnosis not present

## 2017-09-11 DIAGNOSIS — Z7409 Other reduced mobility: Secondary | ICD-10-CM | POA: Diagnosis not present

## 2017-09-12 DIAGNOSIS — I2510 Atherosclerotic heart disease of native coronary artery without angina pectoris: Secondary | ICD-10-CM | POA: Diagnosis not present

## 2017-09-12 DIAGNOSIS — G629 Polyneuropathy, unspecified: Secondary | ICD-10-CM | POA: Diagnosis not present

## 2017-09-12 DIAGNOSIS — R740 Nonspecific elevation of levels of transaminase and lactic acid dehydrogenase [LDH]: Secondary | ICD-10-CM | POA: Diagnosis not present

## 2017-09-12 DIAGNOSIS — M069 Rheumatoid arthritis, unspecified: Secondary | ICD-10-CM | POA: Diagnosis not present

## 2017-09-12 DIAGNOSIS — K117 Disturbances of salivary secretion: Secondary | ICD-10-CM | POA: Diagnosis not present

## 2017-09-12 DIAGNOSIS — Z7982 Long term (current) use of aspirin: Secondary | ICD-10-CM | POA: Diagnosis not present

## 2017-09-12 DIAGNOSIS — E8809 Other disorders of plasma-protein metabolism, not elsewhere classified: Secondary | ICD-10-CM | POA: Diagnosis not present

## 2017-09-12 DIAGNOSIS — Z7409 Other reduced mobility: Secondary | ICD-10-CM | POA: Diagnosis not present

## 2017-09-12 DIAGNOSIS — G709 Myoneural disorder, unspecified: Secondary | ICD-10-CM | POA: Diagnosis not present

## 2017-09-12 DIAGNOSIS — D649 Anemia, unspecified: Secondary | ICD-10-CM | POA: Diagnosis not present

## 2017-09-12 DIAGNOSIS — R1310 Dysphagia, unspecified: Secondary | ICD-10-CM | POA: Diagnosis not present

## 2017-09-14 DIAGNOSIS — M069 Rheumatoid arthritis, unspecified: Secondary | ICD-10-CM | POA: Diagnosis not present

## 2017-09-14 DIAGNOSIS — I2510 Atherosclerotic heart disease of native coronary artery without angina pectoris: Secondary | ICD-10-CM | POA: Diagnosis not present

## 2017-09-14 DIAGNOSIS — Z7982 Long term (current) use of aspirin: Secondary | ICD-10-CM | POA: Diagnosis not present

## 2017-09-14 DIAGNOSIS — E8809 Other disorders of plasma-protein metabolism, not elsewhere classified: Secondary | ICD-10-CM | POA: Diagnosis not present

## 2017-09-14 DIAGNOSIS — Z7409 Other reduced mobility: Secondary | ICD-10-CM | POA: Diagnosis not present

## 2017-09-14 DIAGNOSIS — K117 Disturbances of salivary secretion: Secondary | ICD-10-CM | POA: Diagnosis not present

## 2017-09-14 DIAGNOSIS — R740 Nonspecific elevation of levels of transaminase and lactic acid dehydrogenase [LDH]: Secondary | ICD-10-CM | POA: Diagnosis not present

## 2017-09-14 DIAGNOSIS — D649 Anemia, unspecified: Secondary | ICD-10-CM | POA: Diagnosis not present

## 2017-09-14 DIAGNOSIS — R1310 Dysphagia, unspecified: Secondary | ICD-10-CM | POA: Diagnosis not present

## 2017-09-14 DIAGNOSIS — G629 Polyneuropathy, unspecified: Secondary | ICD-10-CM | POA: Diagnosis not present

## 2017-09-14 DIAGNOSIS — G709 Myoneural disorder, unspecified: Secondary | ICD-10-CM | POA: Diagnosis not present

## 2017-09-15 DIAGNOSIS — R740 Nonspecific elevation of levels of transaminase and lactic acid dehydrogenase [LDH]: Secondary | ICD-10-CM | POA: Diagnosis not present

## 2017-09-15 DIAGNOSIS — K117 Disturbances of salivary secretion: Secondary | ICD-10-CM | POA: Diagnosis not present

## 2017-09-15 DIAGNOSIS — E8809 Other disorders of plasma-protein metabolism, not elsewhere classified: Secondary | ICD-10-CM | POA: Diagnosis not present

## 2017-09-15 DIAGNOSIS — I2510 Atherosclerotic heart disease of native coronary artery without angina pectoris: Secondary | ICD-10-CM | POA: Diagnosis not present

## 2017-09-15 DIAGNOSIS — Z7409 Other reduced mobility: Secondary | ICD-10-CM | POA: Diagnosis not present

## 2017-09-15 DIAGNOSIS — R1310 Dysphagia, unspecified: Secondary | ICD-10-CM | POA: Diagnosis not present

## 2017-09-15 DIAGNOSIS — G629 Polyneuropathy, unspecified: Secondary | ICD-10-CM | POA: Diagnosis not present

## 2017-09-15 DIAGNOSIS — D649 Anemia, unspecified: Secondary | ICD-10-CM | POA: Diagnosis not present

## 2017-09-15 DIAGNOSIS — G709 Myoneural disorder, unspecified: Secondary | ICD-10-CM | POA: Diagnosis not present

## 2017-09-15 DIAGNOSIS — M069 Rheumatoid arthritis, unspecified: Secondary | ICD-10-CM | POA: Diagnosis not present

## 2017-09-15 DIAGNOSIS — Z7982 Long term (current) use of aspirin: Secondary | ICD-10-CM | POA: Diagnosis not present

## 2017-09-16 ENCOUNTER — Ambulatory Visit: Payer: Medicare Other | Admitting: Gastroenterology

## 2017-09-16 DIAGNOSIS — G709 Myoneural disorder, unspecified: Secondary | ICD-10-CM | POA: Diagnosis not present

## 2017-09-16 DIAGNOSIS — Z7409 Other reduced mobility: Secondary | ICD-10-CM | POA: Diagnosis not present

## 2017-09-16 DIAGNOSIS — G629 Polyneuropathy, unspecified: Secondary | ICD-10-CM | POA: Diagnosis not present

## 2017-09-16 DIAGNOSIS — R740 Nonspecific elevation of levels of transaminase and lactic acid dehydrogenase [LDH]: Secondary | ICD-10-CM | POA: Diagnosis not present

## 2017-09-16 DIAGNOSIS — R1310 Dysphagia, unspecified: Secondary | ICD-10-CM | POA: Diagnosis not present

## 2017-09-16 DIAGNOSIS — I2510 Atherosclerotic heart disease of native coronary artery without angina pectoris: Secondary | ICD-10-CM | POA: Diagnosis not present

## 2017-09-16 DIAGNOSIS — K117 Disturbances of salivary secretion: Secondary | ICD-10-CM | POA: Diagnosis not present

## 2017-09-16 DIAGNOSIS — D649 Anemia, unspecified: Secondary | ICD-10-CM | POA: Diagnosis not present

## 2017-09-16 DIAGNOSIS — Z7982 Long term (current) use of aspirin: Secondary | ICD-10-CM | POA: Diagnosis not present

## 2017-09-16 DIAGNOSIS — M069 Rheumatoid arthritis, unspecified: Secondary | ICD-10-CM | POA: Diagnosis not present

## 2017-09-16 DIAGNOSIS — E8809 Other disorders of plasma-protein metabolism, not elsewhere classified: Secondary | ICD-10-CM | POA: Diagnosis not present

## 2017-09-17 DIAGNOSIS — G629 Polyneuropathy, unspecified: Secondary | ICD-10-CM | POA: Diagnosis not present

## 2017-09-17 DIAGNOSIS — R633 Feeding difficulties: Secondary | ICD-10-CM | POA: Diagnosis not present

## 2017-09-17 DIAGNOSIS — K117 Disturbances of salivary secretion: Secondary | ICD-10-CM | POA: Diagnosis not present

## 2017-09-17 DIAGNOSIS — E8809 Other disorders of plasma-protein metabolism, not elsewhere classified: Secondary | ICD-10-CM | POA: Diagnosis not present

## 2017-09-17 DIAGNOSIS — D649 Anemia, unspecified: Secondary | ICD-10-CM | POA: Diagnosis not present

## 2017-09-17 DIAGNOSIS — Z7409 Other reduced mobility: Secondary | ICD-10-CM | POA: Diagnosis not present

## 2017-09-17 DIAGNOSIS — R1310 Dysphagia, unspecified: Secondary | ICD-10-CM | POA: Diagnosis not present

## 2017-09-17 DIAGNOSIS — Z7982 Long term (current) use of aspirin: Secondary | ICD-10-CM | POA: Diagnosis not present

## 2017-09-17 DIAGNOSIS — R1313 Dysphagia, pharyngeal phase: Secondary | ICD-10-CM | POA: Diagnosis not present

## 2017-09-17 DIAGNOSIS — G709 Myoneural disorder, unspecified: Secondary | ICD-10-CM | POA: Diagnosis not present

## 2017-09-17 DIAGNOSIS — M069 Rheumatoid arthritis, unspecified: Secondary | ICD-10-CM | POA: Diagnosis not present

## 2017-09-17 DIAGNOSIS — I2510 Atherosclerotic heart disease of native coronary artery without angina pectoris: Secondary | ICD-10-CM | POA: Diagnosis not present

## 2017-09-17 DIAGNOSIS — R740 Nonspecific elevation of levels of transaminase and lactic acid dehydrogenase [LDH]: Secondary | ICD-10-CM | POA: Diagnosis not present

## 2017-09-18 ENCOUNTER — Inpatient Hospital Stay
Admission: RE | Admit: 2017-09-18 | Discharge: 2017-09-22 | Disposition: A | Discharge status: Nursing Home (Includes ICF) | Payer: Medicare Other | Source: Ambulatory Visit | Attending: Internal Medicine | Admitting: Internal Medicine

## 2017-09-18 DIAGNOSIS — M109 Gout, unspecified: Secondary | ICD-10-CM | POA: Diagnosis present

## 2017-09-18 DIAGNOSIS — R1900 Intra-abdominal and pelvic swelling, mass and lump, unspecified site: Secondary | ICD-10-CM | POA: Diagnosis not present

## 2017-09-18 DIAGNOSIS — R609 Edema, unspecified: Secondary | ICD-10-CM | POA: Diagnosis not present

## 2017-09-18 DIAGNOSIS — Z881 Allergy status to other antibiotic agents status: Secondary | ICD-10-CM | POA: Diagnosis not present

## 2017-09-18 DIAGNOSIS — R6521 Severe sepsis with septic shock: Secondary | ICD-10-CM | POA: Diagnosis not present

## 2017-09-18 DIAGNOSIS — Z87891 Personal history of nicotine dependence: Secondary | ICD-10-CM | POA: Diagnosis not present

## 2017-09-18 DIAGNOSIS — G4733 Obstructive sleep apnea (adult) (pediatric): Secondary | ICD-10-CM | POA: Diagnosis present

## 2017-09-18 DIAGNOSIS — Z8546 Personal history of malignant neoplasm of prostate: Secondary | ICD-10-CM | POA: Diagnosis not present

## 2017-09-18 DIAGNOSIS — Z952 Presence of prosthetic heart valve: Secondary | ICD-10-CM | POA: Diagnosis not present

## 2017-09-18 DIAGNOSIS — Z931 Gastrostomy status: Secondary | ICD-10-CM | POA: Diagnosis not present

## 2017-09-18 DIAGNOSIS — I25810 Atherosclerosis of coronary artery bypass graft(s) without angina pectoris: Secondary | ICD-10-CM | POA: Diagnosis not present

## 2017-09-18 DIAGNOSIS — R410 Disorientation, unspecified: Secondary | ICD-10-CM | POA: Diagnosis not present

## 2017-09-18 DIAGNOSIS — R0602 Shortness of breath: Secondary | ICD-10-CM | POA: Diagnosis not present

## 2017-09-18 DIAGNOSIS — Z7901 Long term (current) use of anticoagulants: Secondary | ICD-10-CM | POA: Diagnosis not present

## 2017-09-18 DIAGNOSIS — Z7409 Other reduced mobility: Secondary | ICD-10-CM | POA: Diagnosis not present

## 2017-09-18 DIAGNOSIS — A419 Sepsis, unspecified organism: Secondary | ICD-10-CM | POA: Diagnosis not present

## 2017-09-18 DIAGNOSIS — K219 Gastro-esophageal reflux disease without esophagitis: Secondary | ICD-10-CM | POA: Diagnosis not present

## 2017-09-18 DIAGNOSIS — E43 Unspecified severe protein-calorie malnutrition: Secondary | ICD-10-CM | POA: Diagnosis not present

## 2017-09-18 DIAGNOSIS — R197 Diarrhea, unspecified: Secondary | ICD-10-CM | POA: Diagnosis not present

## 2017-09-18 DIAGNOSIS — M1000 Idiopathic gout, unspecified site: Secondary | ICD-10-CM | POA: Diagnosis not present

## 2017-09-18 DIAGNOSIS — K52831 Collagenous colitis: Secondary | ICD-10-CM | POA: Diagnosis not present

## 2017-09-18 DIAGNOSIS — R490 Dysphonia: Secondary | ICD-10-CM | POA: Diagnosis not present

## 2017-09-18 DIAGNOSIS — Z5189 Encounter for other specified aftercare: Secondary | ICD-10-CM | POA: Diagnosis not present

## 2017-09-18 DIAGNOSIS — I10 Essential (primary) hypertension: Secondary | ICD-10-CM | POA: Diagnosis present

## 2017-09-18 DIAGNOSIS — Z79899 Other long term (current) drug therapy: Secondary | ICD-10-CM | POA: Diagnosis not present

## 2017-09-18 DIAGNOSIS — Z85828 Personal history of other malignant neoplasm of skin: Secondary | ICD-10-CM | POA: Diagnosis not present

## 2017-09-18 DIAGNOSIS — M069 Rheumatoid arthritis, unspecified: Secondary | ICD-10-CM | POA: Diagnosis not present

## 2017-09-18 DIAGNOSIS — M6281 Muscle weakness (generalized): Secondary | ICD-10-CM | POA: Diagnosis not present

## 2017-09-18 DIAGNOSIS — R531 Weakness: Secondary | ICD-10-CM | POA: Diagnosis not present

## 2017-09-18 DIAGNOSIS — R41841 Cognitive communication deficit: Secondary | ICD-10-CM | POA: Diagnosis not present

## 2017-09-18 DIAGNOSIS — I25119 Atherosclerotic heart disease of native coronary artery with unspecified angina pectoris: Secondary | ICD-10-CM | POA: Diagnosis not present

## 2017-09-18 DIAGNOSIS — E782 Mixed hyperlipidemia: Secondary | ICD-10-CM | POA: Diagnosis not present

## 2017-09-18 DIAGNOSIS — Z6829 Body mass index (BMI) 29.0-29.9, adult: Secondary | ICD-10-CM | POA: Diagnosis not present

## 2017-09-18 DIAGNOSIS — L89159 Pressure ulcer of sacral region, unspecified stage: Secondary | ICD-10-CM | POA: Diagnosis present

## 2017-09-18 DIAGNOSIS — R140 Abdominal distension (gaseous): Secondary | ICD-10-CM | POA: Diagnosis not present

## 2017-09-18 DIAGNOSIS — R1310 Dysphagia, unspecified: Secondary | ICD-10-CM | POA: Diagnosis not present

## 2017-09-18 DIAGNOSIS — Z9089 Acquired absence of other organs: Secondary | ICD-10-CM | POA: Diagnosis not present

## 2017-09-18 DIAGNOSIS — E2740 Unspecified adrenocortical insufficiency: Secondary | ICD-10-CM | POA: Diagnosis present

## 2017-09-18 DIAGNOSIS — R634 Abnormal weight loss: Secondary | ICD-10-CM | POA: Diagnosis not present

## 2017-09-18 DIAGNOSIS — G629 Polyneuropathy, unspecified: Secondary | ICD-10-CM | POA: Diagnosis present

## 2017-09-18 DIAGNOSIS — R1312 Dysphagia, oropharyngeal phase: Secondary | ICD-10-CM | POA: Diagnosis not present

## 2017-09-18 DIAGNOSIS — Z7401 Bed confinement status: Secondary | ICD-10-CM | POA: Diagnosis not present

## 2017-09-18 DIAGNOSIS — M8589 Other specified disorders of bone density and structure, multiple sites: Secondary | ICD-10-CM | POA: Diagnosis not present

## 2017-09-18 DIAGNOSIS — R627 Adult failure to thrive: Secondary | ICD-10-CM | POA: Diagnosis present

## 2017-09-18 DIAGNOSIS — R188 Other ascites: Secondary | ICD-10-CM | POA: Diagnosis not present

## 2017-09-18 DIAGNOSIS — M0680 Other specified rheumatoid arthritis, unspecified site: Secondary | ICD-10-CM | POA: Diagnosis not present

## 2017-09-18 DIAGNOSIS — R109 Unspecified abdominal pain: Secondary | ICD-10-CM | POA: Diagnosis present

## 2017-09-18 DIAGNOSIS — Z955 Presence of coronary angioplasty implant and graft: Secondary | ICD-10-CM | POA: Diagnosis not present

## 2017-09-18 DIAGNOSIS — R7989 Other specified abnormal findings of blood chemistry: Secondary | ICD-10-CM | POA: Diagnosis not present

## 2017-09-18 DIAGNOSIS — I9589 Other hypotension: Secondary | ICD-10-CM | POA: Diagnosis present

## 2017-09-18 DIAGNOSIS — K449 Diaphragmatic hernia without obstruction or gangrene: Secondary | ICD-10-CM | POA: Diagnosis not present

## 2017-09-18 DIAGNOSIS — H538 Other visual disturbances: Secondary | ICD-10-CM | POA: Diagnosis present

## 2017-09-18 DIAGNOSIS — Z431 Encounter for attention to gastrostomy: Secondary | ICD-10-CM | POA: Diagnosis not present

## 2017-09-18 DIAGNOSIS — E785 Hyperlipidemia, unspecified: Secondary | ICD-10-CM | POA: Diagnosis not present

## 2017-09-18 DIAGNOSIS — G709 Myoneural disorder, unspecified: Secondary | ICD-10-CM | POA: Diagnosis present

## 2017-09-18 DIAGNOSIS — Z7982 Long term (current) use of aspirin: Secondary | ICD-10-CM | POA: Diagnosis not present

## 2017-09-18 DIAGNOSIS — L89312 Pressure ulcer of right buttock, stage 2: Secondary | ICD-10-CM | POA: Diagnosis present

## 2017-09-18 DIAGNOSIS — R509 Fever, unspecified: Secondary | ICD-10-CM | POA: Diagnosis not present

## 2017-09-18 DIAGNOSIS — R451 Restlessness and agitation: Secondary | ICD-10-CM | POA: Diagnosis not present

## 2017-09-18 DIAGNOSIS — I2510 Atherosclerotic heart disease of native coronary artery without angina pectoris: Secondary | ICD-10-CM | POA: Diagnosis present

## 2017-09-18 DIAGNOSIS — R278 Other lack of coordination: Secondary | ICD-10-CM | POA: Diagnosis not present

## 2017-09-21 ENCOUNTER — Encounter: Payer: Self-pay | Admitting: Internal Medicine

## 2017-09-21 ENCOUNTER — Encounter (HOSPITAL_COMMUNITY)
Admission: AD | Admit: 2017-09-21 | Discharge: 2017-09-21 | Disposition: A | Discharge status: Home / Self Care | Payer: Medicare Other | Source: Skilled Nursing Facility | Attending: Internal Medicine | Admitting: Internal Medicine

## 2017-09-21 ENCOUNTER — Non-Acute Institutional Stay (SKILLED_NURSING_FACILITY): Payer: Medicare Other | Admitting: Internal Medicine

## 2017-09-21 DIAGNOSIS — M069 Rheumatoid arthritis, unspecified: Secondary | ICD-10-CM | POA: Diagnosis not present

## 2017-09-21 DIAGNOSIS — E43 Unspecified severe protein-calorie malnutrition: Secondary | ICD-10-CM

## 2017-09-21 DIAGNOSIS — R531 Weakness: Secondary | ICD-10-CM | POA: Diagnosis not present

## 2017-09-21 DIAGNOSIS — K52831 Collagenous colitis: Secondary | ICD-10-CM

## 2017-09-21 DIAGNOSIS — R634 Abnormal weight loss: Secondary | ICD-10-CM

## 2017-09-21 DIAGNOSIS — R7989 Other specified abnormal findings of blood chemistry: Secondary | ICD-10-CM

## 2017-09-21 DIAGNOSIS — I25119 Atherosclerotic heart disease of native coronary artery with unspecified angina pectoris: Secondary | ICD-10-CM

## 2017-09-21 DIAGNOSIS — R1310 Dysphagia, unspecified: Secondary | ICD-10-CM

## 2017-09-21 DIAGNOSIS — R945 Abnormal results of liver function studies: Secondary | ICD-10-CM

## 2017-09-21 LAB — COMPREHENSIVE METABOLIC PANEL
ALBUMIN: 2 g/dL — AB (ref 3.5–5.0)
ALK PHOS: 111 U/L (ref 38–126)
ALT: 44 U/L (ref 17–63)
ANION GAP: 7 (ref 5–15)
AST: 34 U/L (ref 15–41)
BUN: 14 mg/dL (ref 6–20)
CALCIUM: 8 mg/dL — AB (ref 8.9–10.3)
CO2: 29 mmol/L (ref 22–32)
Chloride: 103 mmol/L (ref 101–111)
Creatinine, Ser: 0.54 mg/dL — ABNORMAL LOW (ref 0.61–1.24)
GFR calc Af Amer: >60 mL/min (ref >60)
GFR calc non Af Amer: >60 mL/min (ref >60)
GLUCOSE: 75 mg/dL (ref 65–99)
POTASSIUM: 4.7 mmol/L (ref 3.5–5.1)
SODIUM: 139 mmol/L (ref 135–145)
Total Bilirubin: 0.3 mg/dL (ref 0.3–1.2)
Total Protein: 5.1 g/dL — ABNORMAL LOW (ref 6.5–8.1)

## 2017-09-21 LAB — CBC WITH DIFFERENTIAL/PLATELET
BASOS ABS: 0 10*3/uL (ref 0.0–0.1)
Basophils Relative: 0 %
EOS ABS: 0.1 10*3/uL (ref 0.0–0.7)
Eosinophils Relative: 1 %
HEMATOCRIT: 34.2 % — AB (ref 39.0–52.0)
HEMOGLOBIN: 10.6 g/dL — AB (ref 13.0–17.0)
LYMPHS PCT: 35 %
Lymphs Abs: 2.1 10*3/uL (ref 0.7–4.0)
MCH: 32.6 pg (ref 26.0–34.0)
MCHC: 31 g/dL (ref 30.0–36.0)
MCV: 105.2 fL — ABNORMAL HIGH (ref 78.0–100.0)
MONOS PCT: 7 %
Monocytes Absolute: 0.4 10*3/uL (ref 0.1–1.0)
NEUTROS ABS: 3.4 10*3/uL (ref 1.7–7.7)
NEUTROS PCT: 57 %
Platelets: 114 10*3/uL — ABNORMAL LOW (ref 150–400)
RBC: 3.25 MIL/uL — AB (ref 4.22–5.81)
RDW: 18.7 % — ABNORMAL HIGH (ref 11.5–15.5)
WBC: 6 10*3/uL (ref 4.0–10.5)

## 2017-09-21 NOTE — Progress Notes (Signed)
Provider: Veleta Miners  Location:   [place] Room Number: 159/P Place of Service:  SNF (31)  PCP: [name], MD Patient Care Team: [name], MD as PCP - General (Family Medicine) Kevin Romney Cristopher Estimable, MD as Consulting Physician (Gastroenterology)  Extended Emergency Contact Information Primary Emergency Contact: Luan Pulling Address: [address]          [place], [place] [address] [name] of [place] Phone: [phone] Mobile Phone: [phone] Relation: Spouse  Code Status: Full Code Goals of Care: Advanced Directive information Advanced Directives 09/21/2017  Does Patient Have a Medical Advance Directive? Yes  Type of Advance Directive (No Data)  Does patient want to make changes to medical advance directive? No - Patient declined      Chief Complaint  Patient presents with  . New Admit To SNF    New Admission Visit    HPI: Patient is a 81 y.o. male seen today for admission to SNF for Therapy and   Patient has multiple problems including Collagenous Colitis With Chronic Diarrhea,  CAD S/P Stents, HTN, GERD, Gout, Chronic Transaminitis, Hyperlipidemia, Rheumatoid arthritis, Chronic Peripheral Neuropathy, Prostate cancer s/p Prostatectomy, basal cell skin cancer, Oral Lichen Planus  He initially was admitted to [facility] from 08/24-09/07/18 for profound weakness and weight loss . Neurology thought it was due to Chronic and uncontrolled diarrhea. . This led to increasing neuromuscular weakness and subsequently dysphagia, which exacerbated the problem. The patient was diagnosed with polyneuropathy several months ago. Colchicine that he was taking for gout was felt to be exacerbating his symptoms, and was discontinued. He had PEG tube placed on 08/26.  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 ALS was ruled out on Biopsy. Patient stayed in the [facility]  rehab for 2 weeks and was discharged to SNF for Further his therapy. Per Patient and his family he had lost almost 50 lbs before the PEG tube. And now he is up to 154 lbs from 116 lbs in 4 weeks.  He was also found to have Silent Aspiration per Speech.   His diarrhea is definitely better and is completely resolved. He is doing better with his strength but still needs lot of help and is Wheelchair bound. He is on Bolus feed and tolerating it well. Per family patient was very active before. Was able to do all the Chores at home. They are hoping he can get stronger and able to go home.           Past Medical History:  Diagnosis Date  . Collagenous colitis   . Coronary atherosclerosis of native coronary artery    DES RCA 5/11, LVEF 55%  . Essential hypertension, benign   . GERD (gastroesophageal reflux disease)   . Hiatal hernia   . Mixed hyperlipidemia   . Obstructive sleep apnea   . Prostate cancer ([place])   . Pulmonary nodules   . Rheumatoid arthritis [facility])    Past Surgical History:  Procedure Laterality Date  . APPENDECTOMY    . COLONOSCOPY  06/2016   Dr. Britta Mccreedy: mild diverticulosis in left colon, sessile polyp 3-7 mm in distal descending colon, biopsies of colon to assess for microscopic colitis. path: collagenous colitis. colon polyp was benign polypoid colorectal mucosa   . COLONOSCOPY  04/2014   Dr. Britta Mccreedy: office notes stated 3 tubular adenomas and diverticulosis of left colon   . CORONARY ANGIOPLASTY WITH STENT PLACEMENT  2011  .  ESOPHAGOGASTRODUODENOSCOPY  06/2016   Dr. Britta Mccreedy: gastritis, path: chronic active gastritis, negative H.pylori   . HEMORRHOID SURGERY    . LAPAROSCOPIC CHOLECYSTECTOMY    . PROSTATECTOMY    . Skin cancer resection      reports that he quit smoking about 25 years ago. His smoking use included Cigarettes. He started smoking about 65 years ago. He has a 40.00 pack-year smoking history. He has never used smokeless tobacco. He reports that he  drinks alcohol. He reports that he does not use drugs. Social History   Social History  . Marital status: Married    Spouse name: N/A  . Number of children: N/A  . Years of education: N/A   Occupational History  . Not on file.   Social History Main Topics  . Smoking status: Former Smoker    Packs/day: 1.00    Years: 40.00    Types: Cigarettes    Start date: 12/30/1951    Quit date: 12/30/1991  . Smokeless tobacco: Never Used  . Alcohol use 0.0 oz/week     Comment: 1 happy hour beer a day   . Drug use: No  . Sexual activity: Not on file   Other Topics Concern  . Not on file   Social History Narrative  . No narrative on file    Functional Status Survey:    Family History  Problem Relation Age of Onset  . Colon cancer Mother 41  . Hypertension Unknown     Health Maintenance  Topic Date Due  . INFLUENZA VACCINE  11/28/2017 (Originally 07/29/2017)  . TETANUS/TDAP  11/28/2017 (Originally 10/31/1955)  . PNA vac Low Risk Adult (1 of 2 - PCV13) 11/28/2017 (Originally 11/01/2003)    Allergies  Allergen Reactions  . Cefprozil   . Doxycycline Hives and Itching  . Zenpep [Pancrelipase (Lip-Prot-Amyl)]     Patient does not recall reaction but states he had to stop because it was "bad". I have listed in allergies, as we are not able to determine what this was.   . Levofloxacin Rash    Unknown REACTION: rash  . Penicillins Rash    REACTION: hives  . Statins Other (See Comments) and Rash    aching aching    Allergies as of 09/21/2017      Reactions   Cefprozil    Doxycycline Hives, Itching   Zenpep [pancrelipase (lip-prot-amyl)]    Patient does not recall reaction but states he had to stop because it was "bad". I have listed in allergies, as we are not able to determine what this was.    Levofloxacin Rash   Unknown REACTION: rash   Penicillins Rash   REACTION: hives   Statins Other (See Comments), Rash   aching aching      Medication List       Accurate as of  09/21/17  9:40 AM. Always use your most recent med list.          acetaminophen 100 MG/ML solution Commonly known as:  TYLENOL Place 500 mg into feeding tube every 6 (six) hours as needed for fever.   allopurinol 100 MG tablet Commonly known as:  ZYLOPRIM Place 100 mg into feeding tube 2 (two) times daily.   aspirin 81 MG chewable tablet Place 81 mg into feeding tube daily.   budesonide 0.5 MG/2ML nebulizer solution Commonly known as:  PULMICORT Take 0.5 mg by nebulization 3 (three) times daily.   calcium-vitamin D 250-125 MG-UNIT tablet Commonly known as:  OSCAL WITH D  Place 1 tablet into feeding tube daily.   cevimeline 30 MG capsule Commonly known as:  EVOXAC Place 30 mg into feeding tube 3 (three) times daily. May add 1 tab at bedtime as needed.   Cholecalciferol 1000 units capsule Place 1,000 Units into feeding tube daily.   clobetasol 0.05 % Gel Commonly known as:  TEMOVATE Apply topically as needed.   enoxaparin 40 MG/0.4ML injection Commonly known as:  LOVENOX Inject 40 mg into the skin daily.   feeding supplement (JEVITY 1.5 CAL) Liqd Place 1,000 mLs into feeding tube every 4 (four) hours.   gabapentin 250 MG/5ML solution Commonly known as:  NEURONTIN Place 300 mg into feeding tube 3 (three) times daily.   LACTINEX Pack 1 packet; gastric tube. Mix with water   MAGONATE 54 MG/5ML Liqd Generic drug:  Magnesium Carbonate Place 54 mg into feeding tube daily.   metoprolol tartrate 25 MG tablet Commonly known as:  LOPRESSOR TAKE ONE-HALF (1/2) TABLET TWICE A DAY   nitroGLYCERIN 0.4 MG SL tablet Commonly known as:  NITROSTAT Place 1 tablet (0.4 mg total) under the tongue every 5 (five) minutes as needed.   NON FORMULARY 150 ml free water by Peg Tube qid. Keep HOB elevated 45 degrees during all flushes and bolus feeds. Four times a day   pantoprazole sodium 40 mg/20 mL Pack Commonly known as:  PROTONIX Place 40 mg into feeding tube 2 (two) times  daily.   prednisoLONE 5 MG Tabs tablet Place 5 mg into feeding tube daily. From 09/25/2017-09/21/2017   predniSONE 10 MG tablet Commonly known as:  DELTASONE Place 10 mg into feeding tube daily with breakfast. From 09/22/2017-09/24/2017   predniSONE 20 MG tablet Commonly known as:  DELTASONE Place 20 mg into feeding tube daily. From 09/19/2017-09/21/2017   vitamin B-12 1000 MCG tablet Commonly known as:  CYANOCOBALAMIN Place 1,000 mcg into feeding tube daily.       Review of Systems  Constitutional: Positive for activity change, appetite change, fatigue and unexpected weight change.  HENT: Positive for trouble swallowing.   Respiratory: Positive for cough. Negative for chest tightness, shortness of breath and wheezing.   Cardiovascular: Negative for palpitations and leg swelling.  Gastrointestinal: Positive for abdominal distention. Negative for abdominal pain, constipation, diarrhea and vomiting.  Genitourinary: Negative.   Musculoskeletal: Negative.   Skin: Positive for wound.  Neurological: Positive for weakness. Negative for dizziness and light-headedness.  Psychiatric/Behavioral: Negative.     There were no vitals filed for this visit. There is no height or weight on file to calculate BMI. Physical Exam  Constitutional: He is oriented to person, place, and time. He appears well-developed and well-nourished.  HENT:  Head: Normocephalic.  Mouth/Throat: Oropharynx is clear and moist.  Eyes: Pupils are equal, round, and reactive to light.  Neck: Neck supple.  Cardiovascular: Normal rate, regular rhythm and normal heart sounds.   No murmur heard. Pulmonary/Chest: Effort normal and breath sounds normal. No respiratory distress. He has no wheezes. He has no rales.  Abdominal: Soft. Bowel sounds are normal. He exhibits distension. There is no tenderness. There is no rebound.  Mildly Distended Has Midline Incisional scar due   Musculoskeletal:  Trace edema Bilateral    Neurological: He is alert and oriented to person, place, and time.  RUE and LUE had strength 5/5 RLE and Left  3-4/5 right more weaker then Left. More Proximal muscle weakness.   Skin: Skin is warm and dry.  Psychiatric: He has a normal mood and affect.  His behavior is normal. Thought content normal.    Labs reviewed: Basic Metabolic Panel:  Recent Labs  08/08/17 1400 08/15/17 1206 09/21/17 0430  NA 138 137 139  K 3.8 3.9 4.7  CL 105 103 103  CO2 25 26 29   GLUCOSE 80 127* 75  BUN 17 19 14   CREATININE 0.85 0.78 0.54*  CALCIUM 8.3* 8.3* 8.0*   Liver Function Tests:  Recent Labs  07/09/17 0926 08/08/17 1400 09/21/17 0430  AST 76* 113* 34  ALT 63* 125* 44  ALKPHOS 108 128* 111  BILITOT 0.9 1.4* 0.3  PROT 6.1 6.3* 5.1*  ALBUMIN 3.5* 2.6* 2.0*    Recent Labs  08/15/17 1206  LIPASE 31   No results for input(s): AMMONIA in the last 8760 hours. CBC:  Recent Labs  08/08/17 1400 08/15/17 1206 09/21/17 0430  WBC 6.7 8.5 6.0  NEUTROABS 4.5 6.8 3.4  HGB 13.6 13.7 10.6*  HCT 40.7 40.2 34.2*  MCV 97.6 99.8 105.2*  PLT 127* 128* 114*   Cardiac Enzymes: No results for input(s): CKTOTAL, CKMB, CKMBINDEX, TROPONINI in the last 8760 hours. BNP: Invalid input(s): POCBNP No results found for: HGBA1C Lab Results  Component Value Date   TSH 0.480 08/15/2017   No results found for: VITAMINB12 No results found for: FOLATE No results found for: IRON, TIBC, FERRITIN  Imaging and Procedures obtained prior to SNF admission: No results found.  Assessment/Plan  Rapidly progressive weakness Patient is doing well with therapy. Per Neurology it is due to his Malnutrition. D/W Family and therapy. His Weakness has been very inconsistent. Will Get MRI of head as CT scan did show small vessel disease. He does have Follow up With Neurology in few weeks.   Severe protein-calorie malnutrition  There is some discrepancy in his weight but his Albumin Today was 2.0 Will  Continue him on Tube Feeds  Loss of weight Per family he has gained almost 30 lbs on tube feed in Past few weeks. Which Seem unlikely. Will continue to follow his weight in the facility.  Elevated LFTs H/O Transiminitis LFT normal right now.  Rheumatoid arthritis,  H/o But has not been on any immunosuppressive therapy  Dysphagia Discuss in detail with ST. He had failed his MBS few days ago. And is suppose to be NPO. He is having some cough recently and is taking clear Liquids. Plan is to make him NPO and Continue only on Tube feeds for his Nutritional requirements till he is evaluated agin here in facility  Collagenous colitis Diarrhea is actually better on Budesonide.  CAD continue on  Aspirin,and Metoprolol Anemia and Thrombocytopenia Most likely due to chronic Disease. Will follow. Will also  Get Vit B12 level due to macrocytosis Gout Patient had flare of severe Gout in the Rehab. He was taken off Allopurinol and Colchicine as there was some concern of neuropathy He was restarted on Allopurinol and was given Prednisone taper for acute symptoms which are improved right now.  Neuropathy Unknown etiology On Neurontin.   Family/ staff Communication:   Labs/tests ordered: Total time spent in this patient care encounter was 45_ minutes; greater than 50% of the visit spent counseling patient, reviewing records , Labs and coordinating care for problems addressed at this encounter.

## 2017-09-22 ENCOUNTER — Ambulatory Visit: Payer: Medicare Other | Admitting: Internal Medicine

## 2017-09-22 ENCOUNTER — Emergency Department (HOSPITAL_COMMUNITY): Payer: Medicare Other

## 2017-09-22 ENCOUNTER — Non-Acute Institutional Stay (SKILLED_NURSING_FACILITY): Payer: Medicare Other | Admitting: Internal Medicine

## 2017-09-22 ENCOUNTER — Encounter (HOSPITAL_COMMUNITY): Payer: Self-pay | Admitting: Emergency Medicine

## 2017-09-22 ENCOUNTER — Encounter: Payer: Self-pay | Admitting: Internal Medicine

## 2017-09-22 ENCOUNTER — Emergency Department (HOSPITAL_COMMUNITY)
Admission: EM | Admit: 2017-09-22 | Discharge: 2017-09-22 | Disposition: A | Discharge status: Home / Self Care | Payer: Medicare Other | Attending: Internal Medicine | Admitting: Internal Medicine

## 2017-09-22 DIAGNOSIS — M109 Gout, unspecified: Secondary | ICD-10-CM | POA: Insufficient documentation

## 2017-09-22 DIAGNOSIS — Z931 Gastrostomy status: Secondary | ICD-10-CM | POA: Insufficient documentation

## 2017-09-22 DIAGNOSIS — M1000 Idiopathic gout, unspecified site: Secondary | ICD-10-CM | POA: Diagnosis not present

## 2017-09-22 DIAGNOSIS — I2510 Atherosclerotic heart disease of native coronary artery without angina pectoris: Secondary | ICD-10-CM | POA: Diagnosis not present

## 2017-09-22 DIAGNOSIS — I10 Essential (primary) hypertension: Secondary | ICD-10-CM | POA: Insufficient documentation

## 2017-09-22 DIAGNOSIS — Z7982 Long term (current) use of aspirin: Secondary | ICD-10-CM | POA: Diagnosis not present

## 2017-09-22 DIAGNOSIS — K52831 Collagenous colitis: Secondary | ICD-10-CM | POA: Diagnosis not present

## 2017-09-22 DIAGNOSIS — R1900 Intra-abdominal and pelvic swelling, mass and lump, unspecified site: Secondary | ICD-10-CM

## 2017-09-22 DIAGNOSIS — R609 Edema, unspecified: Secondary | ICD-10-CM | POA: Diagnosis not present

## 2017-09-22 DIAGNOSIS — Z7901 Long term (current) use of anticoagulants: Secondary | ICD-10-CM | POA: Insufficient documentation

## 2017-09-22 DIAGNOSIS — E43 Unspecified severe protein-calorie malnutrition: Secondary | ICD-10-CM | POA: Diagnosis not present

## 2017-09-22 DIAGNOSIS — R188 Other ascites: Secondary | ICD-10-CM | POA: Diagnosis not present

## 2017-09-22 DIAGNOSIS — E782 Mixed hyperlipidemia: Secondary | ICD-10-CM | POA: Diagnosis not present

## 2017-09-22 DIAGNOSIS — Z79899 Other long term (current) drug therapy: Secondary | ICD-10-CM | POA: Insufficient documentation

## 2017-09-22 DIAGNOSIS — R140 Abdominal distension (gaseous): Secondary | ICD-10-CM | POA: Diagnosis not present

## 2017-09-22 DIAGNOSIS — Z87891 Personal history of nicotine dependence: Secondary | ICD-10-CM | POA: Insufficient documentation

## 2017-09-22 DIAGNOSIS — Z8546 Personal history of malignant neoplasm of prostate: Secondary | ICD-10-CM | POA: Insufficient documentation

## 2017-09-22 DIAGNOSIS — R0602 Shortness of breath: Secondary | ICD-10-CM | POA: Diagnosis not present

## 2017-09-22 LAB — I-STAT TROPONIN, ED: TROPONIN I, POC: 0 ng/mL (ref 0.00–0.08)

## 2017-09-22 LAB — COMPREHENSIVE METABOLIC PANEL
ALT: 46 U/L (ref 17–63)
AST: 38 U/L (ref 15–41)
Albumin: 2.1 g/dL — ABNORMAL LOW (ref 3.5–5.0)
Alkaline Phosphatase: 112 U/L (ref 38–126)
Anion gap: 6 (ref 5–15)
BILIRUBIN TOTAL: 0.9 mg/dL (ref 0.3–1.2)
BUN: 15 mg/dL (ref 6–20)
CO2: 28 mmol/L (ref 22–32)
Calcium: 7.9 mg/dL — ABNORMAL LOW (ref 8.9–10.3)
Chloride: 106 mmol/L (ref 101–111)
Creatinine, Ser: 0.62 mg/dL (ref 0.61–1.24)
Glucose, Bld: 73 mg/dL (ref 65–99)
POTASSIUM: 3.9 mmol/L (ref 3.5–5.1)
Sodium: 140 mmol/L (ref 135–145)
Total Protein: 5.5 g/dL — ABNORMAL LOW (ref 6.5–8.1)

## 2017-09-22 LAB — CBC WITH DIFFERENTIAL/PLATELET
Basophils Absolute: 0 10*3/uL (ref 0.0–0.1)
Basophils Relative: 0 %
EOS PCT: 1 %
Eosinophils Absolute: 0.1 10*3/uL (ref 0.0–0.7)
HEMATOCRIT: 34.3 % — AB (ref 39.0–52.0)
HEMOGLOBIN: 10.8 g/dL — AB (ref 13.0–17.0)
LYMPHS ABS: 2 10*3/uL (ref 0.7–4.0)
LYMPHS PCT: 39 %
MCH: 32.5 pg (ref 26.0–34.0)
MCHC: 31.5 g/dL (ref 30.0–36.0)
MCV: 103.3 fL — ABNORMAL HIGH (ref 78.0–100.0)
MONOS PCT: 6 %
Monocytes Absolute: 0.3 10*3/uL (ref 0.1–1.0)
NEUTROS PCT: 54 %
Neutro Abs: 2.8 10*3/uL (ref 1.7–7.7)
Platelets: 93 10*3/uL — ABNORMAL LOW (ref 150–400)
RBC: 3.32 MIL/uL — AB (ref 4.22–5.81)
RDW: 18.8 % — ABNORMAL HIGH (ref 11.5–15.5)
WBC: 5.2 10*3/uL (ref 4.0–10.5)

## 2017-09-22 LAB — CBG MONITORING, ED
GLUCOSE-CAPILLARY: 118 mg/dL — AB (ref 65–99)
Glucose-Capillary: 66 mg/dL (ref 65–99)

## 2017-09-22 LAB — BRAIN NATRIURETIC PEPTIDE: B NATRIURETIC PEPTIDE 5: 142 pg/mL — AB (ref 0.0–100.0)

## 2017-09-22 MED ORDER — DEXTROSE 50 % IV SOLN
50.0000 mL | Freq: Once | INTRAVENOUS | Status: AC
  Administered 2017-09-22: 50 mL via INTRAVENOUS

## 2017-09-22 MED ORDER — DEXTROSE 50 % IV SOLN
INTRAVENOUS | Status: DC
Start: 2017-09-22 — End: 2017-09-22
  Filled 2017-09-22: qty 50

## 2017-09-22 MED ORDER — DEXTROSE 50 % IV SOLN: 1.0000 | Freq: Once | INTRAVENOUS | Status: DC

## 2017-09-22 MED ORDER — IOPAMIDOL (ISOVUE-300) INJECTION 61%
100.0000 mL | Freq: Once | INTRAVENOUS | Status: AC | PRN
  Administered 2017-09-22: 100 mL via INTRAVENOUS

## 2017-09-22 MED ORDER — SODIUM CHLORIDE 0.9 % IV BOLUS (SEPSIS)
500.0000 mL | Freq: Once | INTRAVENOUS | Status: AC
Start: 2017-09-22 — End: 2017-09-22
  Administered 2017-09-22: 500 mL via INTRAVENOUS

## 2017-09-22 NOTE — ED Notes (Signed)
Patient had EKG done and seen by Dr Stark Jock.

## 2017-09-22 NOTE — ED Provider Notes (Signed)
[place] DEPT Provider Note   CSN: [id number] Arrival date & time: 09/22/17  1740     History   Chief Complaint Chief Complaint  Patient presents with  . Leg Swelling  . Abdominal Pain    HPI Kevin MINCEY is a 81 y.o. male.  Patient is a 81 year old male with past medical history of coronary artery disease, prostate cancer, rheumatoid arthritis, and collagenous colitis. He was recently admitted at [facility] for meld absorption/malnourishment secondary to his colitis. He was discharged to the [facility] where he has been for the past month. He is receiving tube feeds through a G-tube. According to the nurse at the [facility] and the patient's daughter, his abdomen became more distended this evening when his tube feeds were attempted.   Daughter also tells me his oxygen saturations were 80 and that he had "3+ pitting edema" earlier today. The patient denies to me he is experiencing any abdominal pain, fevers, or chest pain. He does report mild shortness of breath earlier.   The history is provided by the patient.    Past Medical History:  Diagnosis Date  . Collagenous colitis   . Coronary atherosclerosis of native coronary artery    DES RCA 5/11, LVEF 55%  . Essential hypertension, benign   . GERD (gastroesophageal reflux disease)   . Hiatal hernia   . Mixed hyperlipidemia   . Obstructive sleep apnea   . Prostate cancer ([place])   . Pulmonary nodules   . Rheumatoid arthritis [facility])     Patient Active Problem List   Diagnosis Date Noted  . Rheumatoid arthritis ([place]) 08/28/2017  . Transaminitis 08/28/2017  . Rapidly progressive weakness 08/21/2017  . Severe protein-calorie malnutrition ([place]) 08/21/2017  . Dysphagia 08/17/2017  . Loss of weight 12/15/2016  . Collagenous colitis 09/15/2016  . Elevated LFTs 09/15/2016  . Malignant neoplasm of prostate ([place]) 07/10/2014  . Small fiber neuropathy 06/28/2014  . History of idiopathic urticaria 11/28/2011  . Mixed  hyperlipidemia 05/24/2010  . Essential hypertension, benign 05/24/2010  . CORONARY ATHEROSCLEROSIS NATIVE CORONARY ARTERY 05/24/2010    Past Surgical History:  Procedure Laterality Date  . APPENDECTOMY    . COLONOSCOPY  06/2016   Dr. Britta Mccreedy: mild diverticulosis in left colon, sessile polyp 3-7 mm in distal descending colon, biopsies of colon to assess for microscopic colitis. path: collagenous colitis. colon polyp was benign polypoid colorectal mucosa   . COLONOSCOPY  04/2014   Dr. Britta Mccreedy: office notes stated 3 tubular adenomas and diverticulosis of left colon   . CORONARY ANGIOPLASTY WITH STENT PLACEMENT  2011  . ESOPHAGOGASTRODUODENOSCOPY  06/2016   Dr. Britta Mccreedy: gastritis, path: chronic active gastritis, negative H.pylori   . HEMORRHOID SURGERY    . LAPAROSCOPIC CHOLECYSTECTOMY    . PROSTATECTOMY    . Skin cancer resection         Home Medications    Prior to Admission medications   Medication Sig Start Date End Date Taking? Authorizing Provider  acetaminophen (TYLENOL) 100 MG/ML solution Place 500 mg into feeding tube every 6 (six) hours as needed for fever.    [provider]  allopurinol (ZYLOPRIM) 100 MG tablet Place 100 mg into feeding tube 2 (two) times daily.    [provider]  aspirin 81 MG chewable tablet Place 81 mg into feeding tube daily.    [provider]  budesonide (PULMICORT) 0.5 MG/2ML nebulizer solution Take 0.5 mg by nebulization 3 (three) times daily.    [provider]  calcium-vitamin  D (OSCAL WITH D) 250-125 MG-UNIT tablet Place 1 tablet into feeding tube daily.    [provider]  cevimeline (EVOXAC) 30 MG capsule Place 30 mg into feeding tube 3 (three) times daily. May add 1 tab at bedtime as needed.    [provider]  Cholecalciferol 1000 units capsule Place 1,000 Units into feeding tube daily.    [provider]  clobetasol (TEMOVATE) 0.05 % GEL Apply topically as needed.    [provider]  enoxaparin (LOVENOX) 40 MG/0.4ML injection Inject 40 mg into the skin daily.    [provider]  gabapentin (NEURONTIN) 250 MG/5ML solution Place 300 mg into feeding tube 3 (three) times daily.    [provider]  Lactobacillus (LACTINEX) PACK 1 packet; gastric tube. Mix with water    [provider]  Magnesium Carbonate (MAGONATE) 54 MG/5ML LIQD Place 54 mg into feeding tube daily.    [provider]  metoprolol tartrate (LOPRESSOR) 25 MG tablet TAKE ONE-HALF (1/2) TABLET TWICE A DAY 10/31/16   Satira Sark, MD  nitroGLYCERIN (NITROSTAT) 0.4 MG SL tablet Place 1 tablet (0.4 mg total) under the tongue every 5 (five) minutes as needed. 10/31/16   Satira Sark, MD  NON FORMULARY 150 ml free water by Peg Tube qid. Keep HOB elevated 45 degrees during all flushes and bolus feeds. Four times a day    [provider]  Nutritional Supplements (FEEDING SUPPLEMENT, JEVITY 1.5 CAL,) LIQD Place 1,000 mLs into feeding tube every 4 (four) hours.    [provider]  pantoprazole sodium (PROTONIX) 40 mg/20 mL PACK Place 40 mg into feeding tube 2 (two) times daily.    [provider]  prednisoLONE 5 MG TABS tablet Place 5 mg into feeding tube daily. From 09/25/2017-09/21/2017    [provider]  predniSONE (DELTASONE) 10 MG tablet Place 10 mg into feeding tube daily with breakfast. From 09/22/2017-09/24/2017    [provider]  predniSONE (DELTASONE) 20 MG tablet Place 20 mg into feeding tube daily. From 09/19/2017-09/21/2017    [provider]  vitamin B-12 (CYANOCOBALAMIN) 1000 MCG tablet Place 1,000 mcg into feeding tube daily.    [provider]    Family History Family History  Problem Relation Age of Onset  . Colon cancer Mother 88  . Hypertension Unknown     Social History Social History  Substance Use Topics  . Smoking status: Former Smoker    Packs/day: 1.00    Years: 40.00     Types: Cigarettes    Start date: 12/30/1951    Quit date: 12/30/1991  . Smokeless tobacco: Never Used  . Alcohol use 0.0 oz/week     Comment: 1 happy hour beer a day      Allergies   Cefprozil; Doxycycline; Zenpep [pancrelipase (lip-prot-amyl)]; Levofloxacin; Penicillins; and Statins   Review of Systems Review of Systems  All other systems reviewed and are negative.    Physical Exam Updated Vital Signs BP 115/80   Pulse 78   Resp 17   Ht 5\' 4"  (1.626 m)   Wt 60.3 kg (133 lb)   SpO2 97%   BMI 22.83 kg/m   Physical Exam  Constitutional: He is oriented to person, place, and time. He appears well-developed. No distress.  Patient does appear chronically ill. He is awake, alert, and appropriate.  HENT:  Head: Normocephalic and atraumatic.  Mouth/Throat: Oropharynx is clear and moist.  Eyes: Pupils are equal, round, and reactive to light.  EOM are normal.  Neck: Normal range of motion. Neck supple.  Cardiovascular: Normal rate and regular rhythm.  Exam reveals no friction rub.   No murmur heard. Pulmonary/Chest: Effort normal and breath sounds normal. No respiratory distress. He has no wheezes. He has no rales.  Abdominal: Soft. Bowel sounds are normal. He exhibits no distension. There is no tenderness.  The G-tube appears clean and in place. The abdomen is somewhat distended and tympanitic.  Musculoskeletal: Normal range of motion. He exhibits edema.  There is trace pitting edema of both lower extremities.  Neurological: He is alert and oriented to person, place, and time. No cranial nerve deficit. Coordination normal.  Skin: Skin is warm and dry. He is not diaphoretic.  Nursing note and vitals reviewed.    ED Treatments / Results  Labs (all labs ordered are listed, but only abnormal results are displayed) Labs Reviewed  COMPREHENSIVE METABOLIC PANEL  CBC WITH DIFFERENTIAL/PLATELET  BRAIN NATRIURETIC PEPTIDE  CBG MONITORING, ED  I-STAT TROPONIN, ED    EKG  EKG  Interpretation  Date/Time:  Tuesday September 22 2017 03:15:22 EDT Ventricular Rate:  78 PR Interval:    QRS Duration: 82 QT Interval:  370 QTC Calculation: 422 R Axis:   -24 Text Interpretation:  Sinus rhythm Left axis deviation Abnormal R-wave progression, early transition No significant change from 08/15/2017 Confirmed by Veryl Speak [phone]) on 09/22/2017 3:20:24 AM       Radiology No results found.  Procedures Procedures (including critical care time)  Medications Ordered in ED Medications  sodium chloride 0.9 % bolus 500 mL (not administered)  dextrose 50 % solution 50 mL (not administered)     Initial Impression / Assessment and Plan / ED Course  I have reviewed the triage vital signs and the nursing notes.  Pertinent labs & imaging results that were available during my care of the patient were reviewed by me and considered in my medical decision making (see chart for details).  This patient was sent from [facility] for evaluation of swelling and abdominal distention. He was also found to have a low blood sugar. He has a G-tube which appears to be functioning properly.  Workup today reveals low albumin with peripheral edema and what appears to be ascites within the abdomen His BNP is at his baseline and there is no evidence for heart failure on the chest x-ray. His swelling appears to be related to his low albumin. I have advised the patient and family that the way to correct this is proper nutrition. When he is return to the [facility] he will require a nutrition consult.  I spent an extended amount of time discussing the situation with the patient's daughter and wife. They had multiple questions regarding his tube feeds and feeding schedule, all of which I have deferred comment as this is not my area of expertise. I have advised them to discuss with the overseeing physician at the [facility]. Nothing this evening appears emergent and I see no indication  for inpatient hospitalization.  Final Clinical Impressions(s) / ED Diagnoses   Final diagnoses:  None    New Prescriptions New Prescriptions   No medications on file     Veryl Speak, MD 09/22/17 0630

## 2017-09-22 NOTE — ED Triage Notes (Signed)
Per nursing facility pt's abd is distended and has lower leg swelling. Pt has tube feeding and facility is concerned about continuing the feedings. Per facility pt's o2 sat was 88% on room air at facility.

## 2017-09-22 NOTE — ED Notes (Signed)
Patient's family requesting a CBG because when patient left [facility] Blood glucose was 80. Upon checking CBG 66. Dr Stark Jock made aware.

## 2017-09-22 NOTE — ED Notes (Signed)
Gave report to Melissa at the [facility], she will send someone to transport him back to facility.

## 2017-09-22 NOTE — Discharge Instructions (Signed)
I am recommending a nutrition consult when you return to the [facility].  Return to the emergency department if you develop difficulty breathing, high fever, severe pain, or other new and concerning symptoms.

## 2017-09-22 NOTE — Progress Notes (Signed)
Location:   [facility] Room Number: 159/P Place of Service:  SNF (31) Provider:  Ree Edman, Silvestre Moment, MD  Patient Care Team: [name], MD as PCP - General (Family Medicine) [name], MD as Consulting Physician (Gastroenterology)  Extended Emergency Contact Information Primary Emergency Contact: Luan Pulling Address: [address]          [place], [place] [address] [name] of [place] Phone: [phone] Mobile Phone: [phone] Relation: Spouse  Code Status:  Full Code Goals of care: Advanced Directive information Advanced Directives 09/22/2017  Does Patient Have a Medical Advance Directive? Yes  Type of Advance Directive (No Data)  Does patient want to make changes to medical advance directive? No - Patient declined     Chief Complaint  Patient presents with  . Hospitalization Follow-up    F/U Hospitalization Visit    HPI:  Pt is a 81 y.o. male seen today for a hospital f/u s/p admission from ED visit this morning.  Patient has multiple problems including Collagenous Colitis With Chronic Diarrhea,  CAD S/P Stents, HTN, GERD, Gout, Chronic Transaminitis, Hyperlipidemia, Rheumatoid arthritis, Chronic Peripheral Neuropathy, Prostate cancer s/p Prostatectomy, basal cell skin cancer, Oral Lichen Planus  Patient was admitted to SNF for therapy after being in [facility] for 2 weeks of Inpatient Rehab. profound weakness and weight loss . Neurology thought it was due to Chronic and uncontrolled diarrhea. . This led to increasing neuromuscular weakness and subsequently dysphagia, which exacerbated the problem. He had PEG tube Placed on 08/26  Patient yesterday was noticed to have increased swelling of Abdomen and Legs. His BS also was 66 on Accu check and Family took him to ED from the SNF In ED patietn had CT scan of Abdomen which showed Ascites and Small Left pleural Effusion.He was send back to the SNF  Patient is doing well.  Denies Any SOB or Chest pain. Occasional Cough. He walked with therapy today and says he thinks his strength is back.  Past Medical History:  Diagnosis Date  . Collagenous colitis   . Coronary atherosclerosis of native coronary artery    DES RCA 5/11, LVEF 55%  . Essential hypertension, benign   . GERD (gastroesophageal reflux disease)   . Hiatal hernia   . Mixed hyperlipidemia   . Obstructive sleep apnea   . Prostate cancer ([place])   . Pulmonary nodules   . Rheumatoid arthritis [facility])    Past Surgical History:  Procedure Laterality Date  . APPENDECTOMY    . COLONOSCOPY  06/2016   Dr. Britta Mccreedy: mild diverticulosis in left colon, sessile polyp 3-7 mm in distal descending colon, biopsies of colon to assess for microscopic colitis. path: collagenous colitis. colon polyp was benign polypoid colorectal mucosa   . COLONOSCOPY  04/2014   Dr. Britta Mccreedy: office notes stated 3 tubular adenomas and diverticulosis of left colon   . CORONARY ANGIOPLASTY WITH STENT PLACEMENT  2011  . ESOPHAGOGASTRODUODENOSCOPY  06/2016   Dr. Britta Mccreedy: gastritis, path: chronic active gastritis, negative H.pylori   . HEMORRHOID SURGERY    . LAPAROSCOPIC CHOLECYSTECTOMY    . PROSTATECTOMY    . Skin cancer resection      Allergies  Allergen Reactions  . Cefprozil   . Doxycycline Hives and Itching  . Zenpep [Pancrelipase (Lip-Prot-Amyl)]     Patient does not recall reaction but states he had to stop because it was "bad". I have listed in allergies, as we are not able to determine what  this was.   . Levofloxacin Rash    Unknown REACTION: rash  . Penicillins Rash    REACTION: hives  . Statins Other (See Comments) and Rash    aching aching    Allergies as of 09/22/2017      Reactions   Cefprozil    Doxycycline Hives, Itching   Zenpep [pancrelipase (lip-prot-amyl)]    Patient does not recall reaction but states he had to stop because it was "bad". I have listed in allergies, as we are not able to determine what  this was.    Levofloxacin Rash   Unknown REACTION: rash   Penicillins Rash   REACTION: hives   Statins Other (See Comments), Rash   aching aching      Medication List       Accurate as of 09/22/17 10:10 AM. Always use your most recent med list.          acetaminophen 100 MG/ML solution Commonly known as:  TYLENOL Place 500 mg into feeding tube every 6 (six) hours as needed for fever.   allopurinol 100 MG tablet Commonly known as:  ZYLOPRIM Place 100 mg into feeding tube 2 (two) times daily.   aspirin 81 MG chewable tablet Place 81 mg into feeding tube daily.   budesonide 0.5 MG/2ML nebulizer solution Commonly known as:  PULMICORT Take 0.5 mg by nebulization 3 (three) times daily.   calcium-vitamin D 250-125 MG-UNIT tablet Commonly known as:  OSCAL WITH D Place 1 tablet into feeding tube daily.   cevimeline 30 MG capsule Commonly known as:  EVOXAC Place 30 mg into feeding tube 3 (three) times daily. May add 1 tab at bedtime as needed.   Cholecalciferol 1000 units capsule Place 1,000 Units into feeding tube daily.   clobetasol 0.05 % Gel Commonly known as:  TEMOVATE Apply topically as needed.   enoxaparin 40 MG/0.4ML injection Commonly known as:  LOVENOX Inject 40 mg into the skin daily.   feeding supplement (JEVITY 1.5 CAL) Liqd Place 1,000 mLs into feeding tube 2 (two) times daily.   gabapentin 250 MG/5ML solution Commonly known as:  NEURONTIN Place 300 mg into feeding tube 3 (three) times daily.   LACTINEX Pack 1 packet; gastric tube. Mix with water   MAGONATE 54 MG/5ML Liqd Generic drug:  Magnesium Carbonate Place 54 mg into feeding tube daily.   metoprolol tartrate 25 MG tablet Commonly known as:  LOPRESSOR TAKE ONE-HALF (1/2) TABLET TWICE A DAY   nitroGLYCERIN 0.4 MG SL tablet Commonly known as:  NITROSTAT Place 1 tablet (0.4 mg total) under the tongue every 5 (five) minutes as needed.   NON FORMULARY 150 ml free water by Peg Tube qid.  Keep HOB elevated 45 degrees during all flushes and bolus feeds. Four times a day   pantoprazole sodium 40 mg/20 mL Pack Commonly known as:  PROTONIX Place 40 mg into feeding tube 2 (two) times daily.   prednisoLONE 5 MG Tabs tablet Place 5 mg into feeding tube daily. From 09/25/2017-09/27/2017   predniSONE 10 MG tablet Commonly known as:  DELTASONE Place 10 mg into feeding tube daily with breakfast. From 09/22/2017-09/24/2017   SANTYL ointment Generic drug:  collagenase Apply 1 application topically daily. Apply to sacral wound   vitamin B-12 1000 MCG tablet Commonly known as:  CYANOCOBALAMIN Place 1,000 mcg into feeding tube daily.       Review of Systems  Constitutional: Positive for activity change, appetite change and unexpected weight change.  HENT: Negative.  Respiratory: Negative.   Cardiovascular: Positive for leg swelling.  Gastrointestinal: Positive for abdominal distention. Negative for abdominal pain, constipation and diarrhea.  Genitourinary: Negative.   Musculoskeletal: Negative.   Skin: Negative.   Neurological: Positive for weakness.  Psychiatric/Behavioral: Negative.   All other systems reviewed and are negative.   Immunization History  Administered Date(s) Administered  . Influenza-Unspecified 08/29/2014   Pertinent  Health Maintenance Due  Topic Date Due  . INFLUENZA VACCINE  11/28/2017 (Originally 07/29/2017)  . PNA vac Low Risk Adult (1 of 2 - PCV13) 11/28/2017 (Originally 11/01/2003)   Fall Risk  08/26/2016  Falls in the past year? Yes  Comment Emmi Telephone Survey: data to providers prior to load  Number falls in past yr: 1  Comment Emmi Telephone Survey Actual Response = 1  Injury with Fall? Yes   Functional Status Survey:    Vitals:   09/22/17 1010  BP: 120/71  Pulse: 83  Resp: 18  Temp: (!) 97.2 F (36.2 C)  TempSrc: Oral  SpO2: 97%   There is no height or weight on file to calculate BMI. Physical Exam  Constitutional: He is  oriented to person, place, and time. He appears well-developed.  HENT:  Head: Normocephalic.  Mouth/Throat: Oropharynx is clear and moist.  Eyes: Pupils are equal, round, and reactive to light.  Neck: Neck supple.  Cardiovascular: Normal rate and normal heart sounds.   Pulmonary/Chest: Effort normal and breath sounds normal. No respiratory distress. He has no wheezes.  Abdominal: Soft. Bowel sounds are normal. He exhibits distension. There is no tenderness. There is no rebound.  Musculoskeletal:  Moderate Edema  Neurological: He is alert and oriented to person, place, and time.  Skin: Skin is warm and dry.  Psychiatric: He has a normal mood and affect. His behavior is normal.    Labs reviewed:  Recent Labs  08/15/17 1206 09/21/17 0430 09/22/17 0403  NA 137 139 140  K 3.9 4.7 3.9  CL 103 103 106  CO2 26 29 28   GLUCOSE 127* 75 73  BUN 19 14 15   CREATININE 0.78 0.54* 0.62  CALCIUM 8.3* 8.0* 7.9*    Recent Labs  08/08/17 1400 09/21/17 0430 09/22/17 0403  AST 113* 34 38  ALT 125* 44 46  ALKPHOS 128* 111 112  BILITOT 1.4* 0.3 0.9  PROT 6.3* 5.1* 5.5*  ALBUMIN 2.6* 2.0* 2.1*    Recent Labs  08/15/17 1206 09/21/17 0430 09/22/17 0403  WBC 8.5 6.0 5.2  NEUTROABS 6.8 3.4 2.8  HGB 13.7 10.6* 10.8*  HCT 40.2 34.2* 34.3*  MCV 99.8 105.2* 103.3*  PLT 128* 114* 93*   Lab Results  Component Value Date   TSH 0.480 08/15/2017   No results found for: HGBA1C Lab Results  Component Value Date   CHOL  05/08/2010    161        ATP III CLASSIFICATION:  <200     mg/dL   Desirable  200-239  mg/dL   Borderline High  >=240    mg/dL   High          HDL 27 (L) 05/08/2010   LDLCALC  05/08/2010    84        Total Cholesterol/HDL:CHD Risk Coronary Heart Disease Risk Table                     Men   Women  1/2 Average Risk   3.4   3.3  Average Risk  5.0   4.4  2 X Average Risk   9.6   7.1  3 X Average Risk  23.4   11.0        Use the calculated Patient Ratio above  and the CHD Risk Table to determine the patient's CHD Risk.        ATP III CLASSIFICATION (LDL):  <100     mg/dL   Optimal  100-129  mg/dL   Near or Above                    Optimal  130-159  mg/dL   Borderline  160-189  mg/dL   High  >190     mg/dL   Very High   TRIG 248 (H) 05/08/2010   CHOLHDL 6.0 05/08/2010    Significant Diagnostic Results in last 30 days:  Dg Chest 2 View  Result Date: 09/22/2017 CLINICAL DATA:  Shortness of breath. Low oxygen saturation. Leg swelling. EXAM: CHEST  2 VIEW COMPARISON:  Chest CT 08/15/2017, included portion from abdominal CT performed concurrently. FINDINGS: Low lung volumes. Bibasilar atelectasis and trace pleural effusions, pleural fluid better seen on concurrent CT. Normal heart size and mediastinal contours. No pulmonary edema. No pneumothorax. No confluent airspace disease. Wedge compression fracture of lower thoracic vertebra is chronic and unchanged. IMPRESSION: Low lung volumes with bibasilar atelectasis and trace pleural effusions. Electronically Signed   By: Jeb Levering M.D.   On: 09/22/2017 05:03   Ct Abdomen Pelvis W Contrast  Result Date: 09/22/2017 CLINICAL DATA:  Abdominal distension EXAM: CT ABDOMEN AND PELVIS WITH CONTRAST TECHNIQUE: Multidetector CT imaging of the abdomen and pelvis was performed using the standard protocol following bolus administration of intravenous contrast. CONTRAST:  183mL ISOVUE-300 IOPAMIDOL (ISOVUE-300) INJECTION 61% COMPARISON:  CT abdomen pelvis 08/15/2017 FINDINGS: Lower chest: There is bibasilar atelectasis. Trace left pleural effusion. Previously described nodule of the right lung base is not clearly visualized on this study, possibly due to adjacent atelectasis. Hepatobiliary: The liver is small with a mildly nodular contour. There is moderate perihepatic ascites. Status post cholecystectomy. Pancreas: Normal contours without ductal dilatation. No peripancreatic fluid collection. Spleen: Unchanged  low-attenuation focus in the spleen. Moderate perisplenic fluid. Adrenals/Urinary Tract: --Adrenal glands: Normal. --Right kidney/ureter: No hydronephrosis or perinephric stranding. No nephrolithiasis. No obstructing ureteral stones. --Left kidney/ureter: No hydronephrosis or perinephric stranding. No nephrolithiasis. No obstructing ureteral stones. --Urinary bladder: Unremarkable. Stomach/Bowel: --Stomach/Duodenum: There is a gastrostomy tube within the stomach. Stomach is decompressed. Normal course and caliber of the duodenum. --Small bowel: No dilatation or inflammation. --Colon: There are numerous diverticula of the sigmoid colon. The stool throughout the rectum and sigmoid colon is extremely dense and has the appearance of contrast material. However, there is no proximal enteric contrast. I suspect this contrast material is left over from the prior study. --Appendix: Surgically absent. Vascular/Lymphatic: Atherosclerotic calcification is present within the non-aneurysmal abdominal aorta, without hemodynamically significant stenosis. No abdominal or pelvic lymphadenopathy. Reproductive: No free fluid in the pelvis. Musculoskeletal. Multilevel degenerative disc disease and facet arthrosis. No bony spinal canal stenosis. Other: Large amount of fluid within the abdomen and pelvis. Fluid tracks from the perihepatic and perisplenic locations along the paracolic gutters into the lower abdomen. IMPRESSION: 1. Large amount of intraperitoneal free fluid, of uncertain origin. Correlation with albumin levels may be helpful. 2. Hyperdense stool within the distal colon, likely indicating retained contrast material. The most recent scan with enteric contrast that I have record of for this patient was performed  on 08/15/17. This suggests reduced colonic motility. 3.  Aortic Atherosclerosis (ICD10-I70.0). 4. Trace left pleural effusion. 5. Previously described right lung base pulmonary nodule is not clearly seen on this study,  possibly due to adjacent atelectasis. Electronically Signed   By: Ulyses Jarred M.D.   On: 09/22/2017 05:16    Assessment/Plan  Ascites with Possible Mild Cirrhosis Though his albumin is low but with his INR of 1.31 in [place], Low Platelets and now Ascites will start him on Aldactone . D/W family they will follow with GI for his Ascites. Decrease the dose of tylenol.  Thrombocytopenia Can be due to Portal hypertension Will Discontinue Lovenox  Dysphagia Patient had  Swallowing study done this morning and he did well with Clear Liquids.  Plan is to work with his Swallowing and eventually upgrade him to puree. Collagenous colitis Diarrhea is actually better on Budesonide.  CAD continue on  Aspirin,and Metoprolol Gout On prednisone taper and doing well Severe protein-calorie malnutrition   Will Continue him on Tube Feeds Family/ staff Communication:   Labs/tests ordered:    CBC, PT/INR, Ammonia Level, CMP, Daily weight,   Total time spent in this patient care encounter was 45_ minutes; greater than 50% of the visit spent counseling patient, reviewing records , Labs and coordinating care for problems addressed at this encounter.

## 2017-09-24 ENCOUNTER — Other Ambulatory Visit (HOSPITAL_COMMUNITY)
Admission: RE | Admit: 2017-09-24 | Discharge: 2017-09-24 | Disposition: A | Discharge status: Home / Self Care | Payer: Medicare Other | Source: Skilled Nursing Facility | Attending: Internal Medicine | Admitting: Internal Medicine

## 2017-09-24 ENCOUNTER — Other Ambulatory Visit: Payer: Self-pay | Admitting: Internal Medicine

## 2017-09-24 DIAGNOSIS — R269 Unspecified abnormalities of gait and mobility: Secondary | ICD-10-CM

## 2017-09-24 DIAGNOSIS — I10 Essential (primary) hypertension: Secondary | ICD-10-CM | POA: Insufficient documentation

## 2017-09-24 LAB — COMPREHENSIVE METABOLIC PANEL
ALT: 38 U/L (ref 17–63)
AST: 36 U/L (ref 15–41)
Albumin: 1.8 g/dL — ABNORMAL LOW (ref 3.5–5.0)
Alkaline Phosphatase: 99 U/L (ref 38–126)
Anion gap: 8 (ref 5–15)
BILIRUBIN TOTAL: 0.8 mg/dL (ref 0.3–1.2)
BUN: 14 mg/dL (ref 6–20)
CHLORIDE: 103 mmol/L (ref 101–111)
CO2: 25 mmol/L (ref 22–32)
Calcium: 7.6 mg/dL — ABNORMAL LOW (ref 8.9–10.3)
Creatinine, Ser: 0.5 mg/dL — ABNORMAL LOW (ref 0.61–1.24)
GFR calc Af Amer: >60 mL/min (ref >60)
GLUCOSE: 132 mg/dL — AB (ref 65–99)
POTASSIUM: 3.7 mmol/L (ref 3.5–5.1)
Sodium: 136 mmol/L (ref 135–145)
Total Protein: 4.9 g/dL — ABNORMAL LOW (ref 6.5–8.1)

## 2017-09-24 LAB — PROTIME-INR
INR: 1.18
Prothrombin Time: 14.9 seconds (ref 11.4–15.2)

## 2017-09-24 LAB — CBC
HCT: 32.8 % — ABNORMAL LOW (ref 39.0–52.0)
Hemoglobin: 10.5 g/dL — ABNORMAL LOW (ref 13.0–17.0)
MCH: 32.9 pg (ref 26.0–34.0)
MCHC: 32 g/dL (ref 30.0–36.0)
MCV: 102.8 fL — AB (ref 78.0–100.0)
Platelets: 99 10*3/uL — ABNORMAL LOW (ref 150–400)
RBC: 3.19 MIL/uL — ABNORMAL LOW (ref 4.22–5.81)
RDW: 19 % — AB (ref 11.5–15.5)
WBC: 5.5 10*3/uL (ref 4.0–10.5)

## 2017-09-24 LAB — AMMONIA: Ammonia: 37 umol/L — ABNORMAL HIGH (ref 9–35)

## 2017-09-26 ENCOUNTER — Encounter (HOSPITAL_COMMUNITY)
Admission: RE | Admit: 2017-09-26 | Discharge: 2017-09-26 | Disposition: A | Discharge status: Home / Self Care | Payer: Medicare Other

## 2017-09-26 LAB — CBC WITH DIFFERENTIAL/PLATELET
Basophils Absolute: 0 10*3/uL (ref 0.0–0.1)
Basophils Relative: 0 %
EOS PCT: 1 %
Eosinophils Absolute: 0.1 10*3/uL (ref 0.0–0.7)
HCT: 34.7 % — ABNORMAL LOW (ref 39.0–52.0)
Hemoglobin: 11 g/dL — ABNORMAL LOW (ref 13.0–17.0)
LYMPHS PCT: 42 %
Lymphs Abs: 3.1 10*3/uL (ref 0.7–4.0)
MCH: 32.3 pg (ref 26.0–34.0)
MCHC: 31.7 g/dL (ref 30.0–36.0)
MCV: 101.8 fL — AB (ref 78.0–100.0)
MONO ABS: 0.6 10*3/uL (ref 0.1–1.0)
MONOS PCT: 9 %
Neutro Abs: 3.7 10*3/uL (ref 1.7–7.7)
Neutrophils Relative %: 49 %
PLATELETS: 85 10*3/uL — AB (ref 150–400)
RBC: 3.41 MIL/uL — AB (ref 4.22–5.81)
RDW: 19 % — ABNORMAL HIGH (ref 11.5–15.5)
WBC: 7.5 10*3/uL (ref 4.0–10.5)

## 2017-09-26 LAB — URINALYSIS, ROUTINE W REFLEX MICROSCOPIC
BILIRUBIN URINE: NEGATIVE
Glucose, UA: NEGATIVE mg/dL
HGB URINE DIPSTICK: NEGATIVE
Ketones, ur: NEGATIVE mg/dL
Leukocytes, UA: NEGATIVE
Nitrite: NEGATIVE
PROTEIN: NEGATIVE mg/dL
Specific Gravity, Urine: 1.005 (ref 1.005–1.030)
pH: 7 (ref 5.0–8.0)

## 2017-09-26 LAB — COMPREHENSIVE METABOLIC PANEL
ALT: 38 U/L (ref 17–63)
ANION GAP: 6 (ref 5–15)
AST: 39 U/L (ref 15–41)
Albumin: 2.1 g/dL — ABNORMAL LOW (ref 3.5–5.0)
Alkaline Phosphatase: 102 U/L (ref 38–126)
BUN: 15 mg/dL (ref 6–20)
CALCIUM: 7.8 mg/dL — AB (ref 8.9–10.3)
CHLORIDE: 101 mmol/L (ref 101–111)
CO2: 26 mmol/L (ref 22–32)
CREATININE: 0.65 mg/dL (ref 0.61–1.24)
Glucose, Bld: 193 mg/dL — ABNORMAL HIGH (ref 65–99)
Potassium: 3.8 mmol/L (ref 3.5–5.1)
Sodium: 133 mmol/L — ABNORMAL LOW (ref 135–145)
Total Bilirubin: 1.2 mg/dL (ref 0.3–1.2)
Total Protein: 5.6 g/dL — ABNORMAL LOW (ref 6.5–8.1)

## 2017-09-26 LAB — URIC ACID: URIC ACID, SERUM: 3.6 mg/dL — AB (ref 4.4–7.6)

## 2017-09-27 ENCOUNTER — Emergency Department (HOSPITAL_COMMUNITY): Payer: Medicare Other

## 2017-09-27 ENCOUNTER — Inpatient Hospital Stay: Admission: RE | Admit: 2017-09-27 | Payer: Medicare Other | Source: Ambulatory Visit | Admitting: *Deleted

## 2017-09-27 ENCOUNTER — Encounter (HOSPITAL_COMMUNITY): Payer: Self-pay | Admitting: Cardiology

## 2017-09-27 ENCOUNTER — Emergency Department (HOSPITAL_COMMUNITY)
Admission: EM | Admit: 2017-09-27 | Discharge: 2017-09-27 | Disposition: A | Discharge status: Home / Self Care | Payer: Medicare Other | Attending: Emergency Medicine | Admitting: Emergency Medicine

## 2017-09-27 DIAGNOSIS — E785 Hyperlipidemia, unspecified: Secondary | ICD-10-CM | POA: Diagnosis not present

## 2017-09-27 DIAGNOSIS — G709 Myoneural disorder, unspecified: Secondary | ICD-10-CM | POA: Diagnosis present

## 2017-09-27 DIAGNOSIS — R1032 Left lower quadrant pain: Secondary | ICD-10-CM | POA: Diagnosis not present

## 2017-09-27 DIAGNOSIS — Z5189 Encounter for other specified aftercare: Secondary | ICD-10-CM | POA: Diagnosis not present

## 2017-09-27 DIAGNOSIS — I9589 Other hypotension: Secondary | ICD-10-CM | POA: Diagnosis present

## 2017-09-27 DIAGNOSIS — I10 Essential (primary) hypertension: Secondary | ICD-10-CM | POA: Insufficient documentation

## 2017-09-27 DIAGNOSIS — Z881 Allergy status to other antibiotic agents status: Secondary | ICD-10-CM | POA: Diagnosis not present

## 2017-09-27 DIAGNOSIS — Z8546 Personal history of malignant neoplasm of prostate: Secondary | ICD-10-CM | POA: Insufficient documentation

## 2017-09-27 DIAGNOSIS — Z7982 Long term (current) use of aspirin: Secondary | ICD-10-CM | POA: Diagnosis not present

## 2017-09-27 DIAGNOSIS — I2510 Atherosclerotic heart disease of native coronary artery without angina pectoris: Secondary | ICD-10-CM | POA: Diagnosis not present

## 2017-09-27 DIAGNOSIS — L89312 Pressure ulcer of right buttock, stage 2: Secondary | ICD-10-CM | POA: Diagnosis present

## 2017-09-27 DIAGNOSIS — R6521 Severe sepsis with septic shock: Secondary | ICD-10-CM | POA: Insufficient documentation

## 2017-09-27 DIAGNOSIS — R490 Dysphonia: Secondary | ICD-10-CM | POA: Diagnosis not present

## 2017-09-27 DIAGNOSIS — I25810 Atherosclerosis of coronary artery bypass graft(s) without angina pectoris: Secondary | ICD-10-CM | POA: Diagnosis not present

## 2017-09-27 DIAGNOSIS — Z79899 Other long term (current) drug therapy: Secondary | ICD-10-CM | POA: Insufficient documentation

## 2017-09-27 DIAGNOSIS — R41841 Cognitive communication deficit: Secondary | ICD-10-CM | POA: Diagnosis not present

## 2017-09-27 DIAGNOSIS — Z6829 Body mass index (BMI) 29.0-29.9, adult: Secondary | ICD-10-CM | POA: Diagnosis not present

## 2017-09-27 DIAGNOSIS — K52831 Collagenous colitis: Secondary | ICD-10-CM | POA: Diagnosis not present

## 2017-09-27 DIAGNOSIS — R627 Adult failure to thrive: Secondary | ICD-10-CM | POA: Diagnosis not present

## 2017-09-27 DIAGNOSIS — G629 Polyneuropathy, unspecified: Secondary | ICD-10-CM | POA: Diagnosis present

## 2017-09-27 DIAGNOSIS — R410 Disorientation, unspecified: Secondary | ICD-10-CM | POA: Diagnosis not present

## 2017-09-27 DIAGNOSIS — I959 Hypotension, unspecified: Secondary | ICD-10-CM | POA: Diagnosis not present

## 2017-09-27 DIAGNOSIS — H538 Other visual disturbances: Secondary | ICD-10-CM | POA: Diagnosis present

## 2017-09-27 DIAGNOSIS — M8589 Other specified disorders of bone density and structure, multiple sites: Secondary | ICD-10-CM | POA: Diagnosis not present

## 2017-09-27 DIAGNOSIS — R278 Other lack of coordination: Secondary | ICD-10-CM | POA: Diagnosis not present

## 2017-09-27 DIAGNOSIS — R1312 Dysphagia, oropharyngeal phase: Secondary | ICD-10-CM | POA: Diagnosis not present

## 2017-09-27 DIAGNOSIS — Z9989 Dependence on other enabling machines and devices: Secondary | ICD-10-CM | POA: Diagnosis not present

## 2017-09-27 DIAGNOSIS — R451 Restlessness and agitation: Secondary | ICD-10-CM | POA: Diagnosis not present

## 2017-09-27 DIAGNOSIS — Z931 Gastrostomy status: Secondary | ICD-10-CM | POA: Diagnosis not present

## 2017-09-27 DIAGNOSIS — K219 Gastro-esophageal reflux disease without esophagitis: Secondary | ICD-10-CM | POA: Diagnosis not present

## 2017-09-27 DIAGNOSIS — A419 Sepsis, unspecified organism: Secondary | ICD-10-CM

## 2017-09-27 DIAGNOSIS — G4733 Obstructive sleep apnea (adult) (pediatric): Secondary | ICD-10-CM | POA: Diagnosis not present

## 2017-09-27 DIAGNOSIS — Z7409 Other reduced mobility: Secondary | ICD-10-CM | POA: Diagnosis not present

## 2017-09-27 DIAGNOSIS — M6281 Muscle weakness (generalized): Secondary | ICD-10-CM | POA: Diagnosis not present

## 2017-09-27 DIAGNOSIS — K449 Diaphragmatic hernia without obstruction or gangrene: Secondary | ICD-10-CM | POA: Diagnosis not present

## 2017-09-27 DIAGNOSIS — R509 Fever, unspecified: Secondary | ICD-10-CM | POA: Diagnosis present

## 2017-09-27 DIAGNOSIS — M109 Gout, unspecified: Secondary | ICD-10-CM | POA: Diagnosis present

## 2017-09-27 DIAGNOSIS — E43 Unspecified severe protein-calorie malnutrition: Secondary | ICD-10-CM | POA: Diagnosis not present

## 2017-09-27 DIAGNOSIS — Z952 Presence of prosthetic heart valve: Secondary | ICD-10-CM | POA: Diagnosis not present

## 2017-09-27 DIAGNOSIS — Z955 Presence of coronary angioplasty implant and graft: Secondary | ICD-10-CM | POA: Diagnosis not present

## 2017-09-27 DIAGNOSIS — E782 Mixed hyperlipidemia: Secondary | ICD-10-CM | POA: Diagnosis not present

## 2017-09-27 DIAGNOSIS — Z9089 Acquired absence of other organs: Secondary | ICD-10-CM | POA: Diagnosis not present

## 2017-09-27 DIAGNOSIS — R634 Abnormal weight loss: Secondary | ICD-10-CM | POA: Diagnosis not present

## 2017-09-27 DIAGNOSIS — E2740 Unspecified adrenocortical insufficiency: Secondary | ICD-10-CM | POA: Diagnosis not present

## 2017-09-27 DIAGNOSIS — M069 Rheumatoid arthritis, unspecified: Secondary | ICD-10-CM | POA: Diagnosis not present

## 2017-09-27 DIAGNOSIS — R2689 Other abnormalities of gait and mobility: Secondary | ICD-10-CM | POA: Diagnosis not present

## 2017-09-27 DIAGNOSIS — R1310 Dysphagia, unspecified: Secondary | ICD-10-CM | POA: Diagnosis not present

## 2017-09-27 DIAGNOSIS — C801 Malignant (primary) neoplasm, unspecified: Secondary | ICD-10-CM | POA: Diagnosis not present

## 2017-09-27 DIAGNOSIS — Z431 Encounter for attention to gastrostomy: Secondary | ICD-10-CM | POA: Diagnosis not present

## 2017-09-27 DIAGNOSIS — R197 Diarrhea, unspecified: Secondary | ICD-10-CM | POA: Diagnosis not present

## 2017-09-27 DIAGNOSIS — L89159 Pressure ulcer of sacral region, unspecified stage: Secondary | ICD-10-CM | POA: Diagnosis present

## 2017-09-27 DIAGNOSIS — M0680 Other specified rheumatoid arthritis, unspecified site: Secondary | ICD-10-CM | POA: Diagnosis not present

## 2017-09-27 DIAGNOSIS — Z85828 Personal history of other malignant neoplasm of skin: Secondary | ICD-10-CM | POA: Diagnosis not present

## 2017-09-27 LAB — COMPREHENSIVE METABOLIC PANEL
ALBUMIN: 1.9 g/dL — AB (ref 3.5–5.0)
ALT: 29 U/L (ref 17–63)
ANION GAP: 8 (ref 5–15)
AST: 31 U/L (ref 15–41)
Alkaline Phosphatase: 105 U/L (ref 38–126)
BUN: 15 mg/dL (ref 6–20)
CHLORIDE: 102 mmol/L (ref 101–111)
CO2: 23 mmol/L (ref 22–32)
Calcium: 7.8 mg/dL — ABNORMAL LOW (ref 8.9–10.3)
Creatinine, Ser: 0.59 mg/dL — ABNORMAL LOW (ref 0.61–1.24)
GFR calc non Af Amer: >60 mL/min (ref >60)
GLUCOSE: 207 mg/dL — AB (ref 65–99)
Potassium: 4.1 mmol/L (ref 3.5–5.1)
SODIUM: 133 mmol/L — AB (ref 135–145)
Total Bilirubin: 1.1 mg/dL (ref 0.3–1.2)
Total Protein: 5.3 g/dL — ABNORMAL LOW (ref 6.5–8.1)

## 2017-09-27 LAB — CBC WITH DIFFERENTIAL/PLATELET
BASOS ABS: 0 10*3/uL (ref 0.0–0.1)
Basophils Relative: 0 %
Eosinophils Absolute: 0.1 10*3/uL (ref 0.0–0.7)
Eosinophils Relative: 1 %
HEMATOCRIT: 32.1 % — AB (ref 39.0–52.0)
Hemoglobin: 10.4 g/dL — ABNORMAL LOW (ref 13.0–17.0)
LYMPHS PCT: 34 %
Lymphs Abs: 2.4 10*3/uL (ref 0.7–4.0)
MCH: 32.8 pg (ref 26.0–34.0)
MCHC: 32.4 g/dL (ref 30.0–36.0)
MCV: 101.3 fL — ABNORMAL HIGH (ref 78.0–100.0)
Monocytes Absolute: 0.5 10*3/uL (ref 0.1–1.0)
Monocytes Relative: 8 %
NEUTROS ABS: 4 10*3/uL (ref 1.7–7.7)
Neutrophils Relative %: 57 %
Platelets: 84 10*3/uL — ABNORMAL LOW (ref 150–400)
RBC: 3.17 MIL/uL — AB (ref 4.22–5.81)
RDW: 18.7 % — AB (ref 11.5–15.5)
WBC: 6.9 10*3/uL (ref 4.0–10.5)

## 2017-09-27 LAB — URINALYSIS, ROUTINE W REFLEX MICROSCOPIC
Bilirubin Urine: NEGATIVE
Glucose, UA: NEGATIVE mg/dL
Hgb urine dipstick: NEGATIVE
Ketones, ur: NEGATIVE mg/dL
LEUKOCYTES UA: NEGATIVE
Nitrite: NEGATIVE
PROTEIN: NEGATIVE mg/dL
Specific Gravity, Urine: 1.015 (ref 1.005–1.030)
pH: 7 (ref 5.0–8.0)

## 2017-09-27 LAB — PROTIME-INR
INR: 1.29
Prothrombin Time: 15.9 seconds — ABNORMAL HIGH (ref 11.4–15.2)

## 2017-09-27 LAB — I-STAT CG4 LACTIC ACID, ED
Lactic Acid, Venous: 2.85 mmol/L (ref 0.5–1.9)
Lactic Acid, Venous: 1.72 mmol/L (ref 0.5–1.9)

## 2017-09-27 LAB — INFLUENZA PANEL BY PCR (TYPE A & B)
INFLAPCR: NEGATIVE
INFLBPCR: NEGATIVE

## 2017-09-27 MED ORDER — ACETAMINOPHEN 325 MG PO TABS
650.0000 mg | ORAL_TABLET | Freq: Once | ORAL | Status: AC
Start: 2017-09-27 — End: 2017-09-27
  Administered 2017-09-27: 650 mg via ORAL
  Filled 2017-09-27: qty 2

## 2017-09-27 MED ORDER — SODIUM CHLORIDE 0.9 % IV BOLUS (SEPSIS)
250.0000 mL | Freq: Once | INTRAVENOUS | Status: AC
  Administered 2017-09-27: 250 mL via INTRAVENOUS

## 2017-09-27 MED ORDER — VANCOMYCIN HCL IN DEXTROSE 750-5 MG/150ML-% IV SOLN
750.0000 mg | Freq: Two times a day (BID) | INTRAVENOUS | Status: DC
  Filled 2017-09-27 (×2): qty 150

## 2017-09-27 MED ORDER — SODIUM CHLORIDE 0.9 % IV BOLUS (SEPSIS)
1000.0000 mL | Freq: Once | INTRAVENOUS | Status: AC
  Administered 2017-09-27: 1000 mL via INTRAVENOUS

## 2017-09-27 MED ORDER — VANCOMYCIN HCL IN DEXTROSE 1-5 GM/200ML-% IV SOLN
1000.0000 mg | Freq: Once | INTRAVENOUS | Status: AC
  Administered 2017-09-27: 1000 mg via INTRAVENOUS
  Filled 2017-09-27: qty 200

## 2017-09-27 MED ORDER — SODIUM CHLORIDE 0.9 % IV BOLUS (SEPSIS): 500.0000 mL | Freq: Once | INTRAVENOUS | Status: DC

## 2017-09-27 MED ORDER — DEXTROSE 5 % IV SOLN
1.0000 g | Freq: Three times a day (TID) | INTRAVENOUS | Status: DC
  Filled 2017-09-27 (×3): qty 1

## 2017-09-27 MED ORDER — AZTREONAM 2 G IJ SOLR
2.0000 g | Freq: Once | INTRAMUSCULAR | Status: AC
  Administered 2017-09-27: 2 g via INTRAVENOUS
  Filled 2017-09-27: qty 2

## 2017-09-27 NOTE — ED Notes (Signed)
CRITICAL VALUE ALERT  Critical Value: Lactic acid: 2.85  Date & Time Notied:  09/90/2018  Provider Notified: [id number]  Orders Received/Actions taken: Dr Regenia Skeeter, IVF

## 2017-09-27 NOTE — Progress Notes (Signed)
09/27/2017 2:09 PM  Called by ED for consult.  I met with patient and family.  Wife requesting patient go to [facility] where he was just recently discharged from a 4 week stay.  I informed ED physician and handed over care to ED.    Murvin Natal MD

## 2017-09-27 NOTE — ED Provider Notes (Signed)
[facility] DEPT Provider Note   CSN: [id number] Arrival date & time: 09/27/17  1052     History   Chief Complaint Chief Complaint  Patient presents with  . Fever    HPI Kevin Mathis is a 81 y.o. male.  HPI  81 year old male presents with fever. Sent over from his living facility. Patient has history of Collagenous colitis, prostate cancer, rheumatoid arthritis and a G-tube. Patient developed a fever yesterday and his maximum temperature has been 102 this morning. He denies a headache, nausea, vomiting, chest pain, shortness of breath, or abdominal pain. He denies congestion or sore throat. He has had a nonproductive cough for about one week. Since yesterday he's been having diffuse body aches.  Past Medical History:  Diagnosis Date  . Collagenous colitis   . Coronary atherosclerosis of native coronary artery    DES RCA 5/11, LVEF 55%  . Essential hypertension, benign   . GERD (gastroesophageal reflux disease)   . Hiatal hernia   . Mixed hyperlipidemia   . Obstructive sleep apnea   . Prostate cancer ([place])   . Pulmonary nodules   . Rheumatoid arthritis [facility])     Patient Active Problem List   Diagnosis Date Noted  . Gout 09/22/2017  . Rheumatoid arthritis ([place]) 08/28/2017  . Transaminitis 08/28/2017  . Rapidly progressive weakness 08/21/2017  . Severe protein-calorie malnutrition ([place]) 08/21/2017  . Dysphagia 08/17/2017  . Loss of weight 12/15/2016  . Collagenous colitis 09/15/2016  . Elevated LFTs 09/15/2016  . Malignant neoplasm of prostate ([place]) 07/10/2014  . Small fiber neuropathy 06/28/2014  . History of idiopathic urticaria 11/28/2011  . Mixed hyperlipidemia 05/24/2010  . Essential hypertension, benign 05/24/2010  . CORONARY ATHEROSCLEROSIS NATIVE CORONARY ARTERY 05/24/2010    Past Surgical History:  Procedure Laterality Date  . APPENDECTOMY    . COLONOSCOPY  06/2016   Dr. Britta Mccreedy: mild diverticulosis in left colon, sessile polyp 3-7 mm in distal  descending colon, biopsies of colon to assess for microscopic colitis. path: collagenous colitis. colon polyp was benign polypoid colorectal mucosa   . COLONOSCOPY  04/2014   Dr. Britta Mccreedy: office notes stated 3 tubular adenomas and diverticulosis of left colon   . CORONARY ANGIOPLASTY WITH STENT PLACEMENT  2011  . ESOPHAGOGASTRODUODENOSCOPY  06/2016   Dr. Britta Mccreedy: gastritis, path: chronic active gastritis, negative H.pylori   . HEMORRHOID SURGERY    . LAPAROSCOPIC CHOLECYSTECTOMY    . PROSTATECTOMY    . Skin cancer resection         Home Medications    Prior to Admission medications   Medication Sig Start Date End Date Taking? Authorizing Provider  acetaminophen (TYLENOL) 100 MG/ML solution Place 500 mg into feeding tube every 6 (six) hours as needed for fever.   Yes [provider]  allopurinol (ZYLOPRIM) 100 MG tablet Place 100 mg into feeding tube 2 (two) times daily.   Yes [provider]  aspirin 81 MG chewable tablet Place 81 mg into feeding tube daily.   Yes [provider]  budesonide (PULMICORT) 0.5 MG/2ML nebulizer solution Take 0.5 mg by nebulization 3 (three) times daily.   Yes [provider]  calcium-vitamin D (OSCAL WITH D) 250-125 MG-UNIT tablet Place 1 tablet into feeding tube daily.   Yes [provider]  cevimeline (EVOXAC) 30 MG capsule Place 30 mg into feeding tube 3 (three) times daily. May add 1 tab at bedtime as needed.   Yes [provider]  Cholecalciferol 1000 units capsule Place 1,000 Units  into feeding tube daily.   Yes [provider]  clobetasol (TEMOVATE) 0.05 % GEL Apply topically as needed.   Yes [provider]  collagenase (SANTYL) ointment Apply 1 application topically daily. Apply to sacral wound   Yes [provider]  gabapentin (NEURONTIN) 250 MG/5ML solution Place 300 mg into feeding tube 3 (three) times daily.   Yes [provider]  Lactobacillus (LACTINEX)  PACK 1 packet; gastric tube. Mix with water   Yes [provider]  Magnesium Carbonate (MAGONATE) 54 MG/5ML LIQD Place 54 mg into feeding tube daily.   Yes [provider]  metoprolol tartrate (LOPRESSOR) 25 MG tablet TAKE ONE-HALF (1/2) TABLET TWICE A DAY 10/31/16  Yes Satira Sark, MD  nitroGLYCERIN (NITROSTAT) 0.4 MG SL tablet Place 1 tablet (0.4 mg total) under the tongue every 5 (five) minutes as needed. 10/31/16  Yes Satira Sark, MD  NON FORMULARY 150 ml free water by Peg Tube qid. Keep HOB elevated 45 degrees during all flushes and bolus feeds. Four times a day   Yes [provider]  Nutritional Supplements (FEEDING SUPPLEMENT, JEVITY 1.5 CAL,) LIQD Place 1,000 mLs into feeding tube 2 (two) times daily.    Yes [provider]  pantoprazole sodium (PROTONIX) 40 mg/20 mL PACK Place 40 mg into feeding tube 2 (two) times daily.   Yes [provider]  prednisoLONE 5 MG TABS tablet Place 5 mg into feeding tube daily. From 09/25/2017-09/27/2017   Yes [provider]  predniSONE (DELTASONE) 10 MG tablet Place 10 mg into feeding tube daily with breakfast. From 09/22/2017-09/24/2017   Yes [provider]  spironolactone (ALDACTONE) 25 MG tablet Take 25 mg by mouth daily.   Yes [provider]  vitamin B-12 (CYANOCOBALAMIN) 1000 MCG tablet Place 1,000 mcg into feeding tube daily.   Yes [provider]    Family History Family History  Problem Relation Age of Onset  . Colon cancer Mother 33  . Hypertension Unknown     Social History Social History  Substance Use Topics  . Smoking status: Former Smoker    Packs/day: 1.00    Years: 40.00    Types: Cigarettes    Start date: 12/30/1951    Quit date: 12/30/1991  . Smokeless tobacco: Never Used  . Alcohol use 0.0 oz/week     Comment: 1 happy hour beer a day      Allergies   Cefprozil; Doxycycline; Zenpep [pancrelipase (lip-prot-amyl)]; Levofloxacin;  Penicillins; and Statins   Review of Systems Review of Systems  Constitutional: Positive for fever.  HENT: Negative for congestion.   Respiratory: Positive for cough. Negative for shortness of breath.   Cardiovascular: Negative for chest pain.  Gastrointestinal: Positive for diarrhea (chronic diarrhea). Negative for abdominal pain and vomiting.  Genitourinary: Negative for dysuria.  Musculoskeletal: Positive for arthralgias.  Neurological: Negative for headaches.  All other systems reviewed and are negative.    Physical Exam Updated Vital Signs BP 90/60 (BP Location: Left Arm)   Pulse 84   Temp (!) 101.8 F (38.8 C) (Rectal)   Resp 18   Ht 5\' 4"  (1.626 m)   Wt 68.5 kg (151 lb)   SpO2 99%   BMI 25.92 kg/m   Physical Exam  Constitutional: He is oriented to person, place, and time. He appears well-developed and well-nourished. No distress.  HENT:  Head: Normocephalic and atraumatic.  Right Ear: External ear normal.  Left Ear: External ear normal.  Nose: Nose normal.  Eyes:  Right eye exhibits no discharge. Left eye exhibits no discharge.  Neck: Neck supple.  Cardiovascular: Normal rate, regular rhythm and normal heart sounds.   Pulmonary/Chest: Effort normal. He has rales (bibasilar).  Abdominal: Soft. He exhibits no distension. There is no tenderness.  g-tube in place No abdominal distention or fluid wave. No tenderness  Musculoskeletal: He exhibits no edema.  Neurological: He is alert and oriented to person, place, and time.  Skin: Skin is warm and dry. He is not diaphoretic.  Small sacral decubitus wound. No drainage, cellulitis or tenderness  Nursing note and vitals reviewed.    ED Treatments / Results  Labs (all labs ordered are listed, but only abnormal results are displayed) Labs Reviewed  COMPREHENSIVE METABOLIC PANEL - Abnormal; Notable for the following:       Result Value   Sodium 133 (*)    Glucose, Bld 207 (*)    Creatinine, Ser 0.59 (*)     Calcium 7.8 (*)    Total Protein 5.3 (*)    Albumin 1.9 (*)    All other components within normal limits  CBC WITH DIFFERENTIAL/PLATELET - Abnormal; Notable for the following:    RBC 3.17 (*)    Hemoglobin 10.4 (*)    HCT 32.1 (*)    MCV 101.3 (*)    RDW 18.7 (*)    Platelets 84 (*)    All other components within normal limits  PROTIME-INR - Abnormal; Notable for the following:    Prothrombin Time 15.9 (*)    All other components within normal limits  I-STAT CG4 LACTIC ACID, ED - Abnormal; Notable for the following:    Lactic Acid, Venous 2.85 (*)    All other components within normal limits  CULTURE, BLOOD (ROUTINE X 2)  CULTURE, BLOOD (ROUTINE X 2)  URINALYSIS, ROUTINE W REFLEX MICROSCOPIC  INFLUENZA PANEL BY PCR (TYPE A & B)  I-STAT CG4 LACTIC ACID, ED    EKG  EKG Interpretation  Date/Time:  Sunday September 27 2017 11:19:25 EDT Ventricular Rate:  88 PR Interval:    QRS Duration: 80 QT Interval:  334 QTC Calculation: 404 R Axis:   7 Text Interpretation:  Sinus rhythm Borderline low voltage, extremity leads Abnormal R-wave progression, early transition no significant change since sept 25 2018 Confirmed by Sherwood Gambler [id number]) on 09/27/2017 11:31:29 AM       Radiology Dg Chest Port 1 View  Result Date: 09/27/2017 CLINICAL DATA:  Sepsis. EXAM: PORTABLE CHEST 1 VIEW COMPARISON:  09/22/2017 FINDINGS: The cardiomediastinal silhouette is unchanged and within normal limits. The lungs remain hypoinflated, and there is slight elevation of the right hemidiaphragm. No lobar consolidation, edema, sizable pleural effusion, or pneumothorax is identified. IMPRESSION: Hypoinflation.  No evidence of pneumonia. Electronically Signed   By: Logan Bores M.D.   On: 09/27/2017 12:09    Procedures .Critical Care Performed by: Sherwood Gambler Authorized by: Sherwood Gambler   Critical care provider statement:    Critical care time (minutes):  40   Critical care was necessary to treat  or prevent imminent or life-threatening deterioration of the following conditions:  Circulatory failure, sepsis and shock   Critical care was time spent personally by me on the following activities:  Development of treatment plan with patient or surrogate, discussions with consultants, evaluation of patient's response to treatment, examination of patient, obtaining history from patient or surrogate, ordering and performing treatments and interventions, ordering and review of laboratory studies, ordering and review of radiographic studies, pulse oximetry, re-evaluation  of patient's condition and review of old charts   (including critical care time)  Medications Ordered in ED Medications  vancomycin (VANCOCIN) IVPB 750 mg/150 ml premix (not administered)  aztreonam (AZACTAM) 1 g in dextrose 5 % 50 mL IVPB (not administered)  sodium chloride 0.9 % bolus 500 mL (not administered)  acetaminophen (TYLENOL) tablet 650 mg (not administered)  sodium chloride 0.9 % bolus 1,000 mL (0 mLs Intravenous Stopped 09/27/17 1205)  aztreonam (AZACTAM) 2 g in dextrose 5 % 50 mL IVPB (0 g Intravenous Stopped 09/27/17 1208)  vancomycin (VANCOCIN) IVPB 1000 mg/200 mL premix (0 mg Intravenous Stopped 09/27/17 1308)  sodium chloride 0.9 % bolus 1,000 mL (0 mLs Intravenous Stopped 09/27/17 1336)    And  sodium chloride 0.9 % bolus 250 mL (0 mLs Intravenous Stopped 09/27/17 1400)     Initial Impression / Assessment and Plan / ED Course  I have reviewed the triage vital signs and the nursing notes.  Pertinent labs & imaging results that were available during my care of the patient were reviewed by me and considered in my medical decision making (see chart for details).     Patient was called a code sepsis given his initial blood pressure in the 80s as well as the next subsequent blood pressure.Was found to have a rectal temperature 101.8. His only focal symptoms though suggest cause of infection is cough/arthralgias. He  has no headache, meningismus, abdominal pain. His diarrhea is chronic. He does have a relatively new diagnosis of moderate ascites on CT 5 days ago. However I could not find a good patch to draw fluid from for testing. However my concern for SBP is quite low given no significant abdominal distention or abdominal pain. Thus peritonitis seems less likely. He was given antibiotics for likely pulmonary source. His flu swab was negative. He has multiple allergies and this was worked around. His mental status has been well. On multiple rechecks he has strong, 2+ pulses in all 4 extremities. He has maintained blood pressures in the high 80s but MAPs have been 60s or 65. He was given 30 mL/KG. I discussed with our hospitalist who after talking with patient and wife want to send patient to [facility] given he recently was admitted and discharged earlier this month. I talked to the hospital to [facility] asks for ICU consult for admission given the low blood pressures. I discussed with Dr. Eligah East, who accepts patient in transfer and admission. He asks for some more IV fluids but at this point does not think pressors are needed. He does not want a central line placed as long as good IV access is present in case low-dose levophed as needed.Patient currently has 3 IVs, one 18-gauge and 2 20-gauges. Currently chest prior to transfer, patient's blood pressure is now 627 systolic. He appears stable for transfer. I discussed risks of transfer and plan with patient and wife.  Final Clinical Impressions(s) / ED Diagnoses   Final diagnoses:  Septic shock [facility])    New Prescriptions New Prescriptions   No medications on file     Sherwood Gambler, MD 09/27/17 1501

## 2017-09-27 NOTE — ED Notes (Signed)
Report given to [facility]  

## 2017-09-27 NOTE — ED Triage Notes (Signed)
Fever since yesterday.  C/o generalized pain all over.  C/o on and off cough.  Pt is from [facility] and has had chest x-ray, UA and labs done.

## 2017-09-27 NOTE — Progress Notes (Signed)
Pharmacy Antibiotic Note  Kevin Mathis is a 81 y.o. male admitted on 09/27/2017 with pneumonia.  Pharmacy has been consulted for Azactam and Vancomycin dosing. Vancomycin 1 GM and Azactam 2 GM given in ED  Plan: Azactam 1 GM IV every 8 hours Vancomycin 750 mg Iv every 12 hours Vancomycin trough at steady state Labs per protocol F/U Cultures  Height: 5\' 4"  (162.6 cm) Weight: 151 lb (68.5 kg) IBW/kg (Calculated) : 59.2  Temp (24hrs), Avg:100.3 F (37.9 C), Min:98.7 F (37.1 C), Max:101.8 F (38.8 C)   Recent Labs Lab 09/21/17 0430 09/22/17 0403 09/24/17 0750 09/26/17 1425 09/27/17 1126  WBC 6.0 5.2 5.5 7.5  --   CREATININE 0.54* 0.62 0.50* 0.65  --   LATICACIDVEN  --   --   --   --  2.85*    Estimated Creatinine Clearance: 63.7 mL/min (by C-G formula based on SCr of 0.65 mg/dL).    Allergies  Allergen Reactions  . Cefprozil   . Doxycycline Hives and Itching  . Zenpep [Pancrelipase (Lip-Prot-Amyl)]     Patient does not recall reaction but states he had to stop because it was "bad". I have listed in allergies, as we are not able to determine what this was.   . Levofloxacin Rash    Unknown REACTION: rash  . Penicillins Rash    REACTION: hives  . Statins Other (See Comments) and Rash    aching aching    Antimicrobials this admission: Vancomycin 9/30 >>  Azactam 9/30 >>     Thank you for allowing pharmacy to be a part of this patient's care.  Chriss Czar 09/27/2017 11:49 AM

## 2017-09-28 ENCOUNTER — Ambulatory Visit (HOSPITAL_COMMUNITY): Payer: Medicare Other

## 2017-09-28 LAB — URINE CULTURE: Culture: NO GROWTH

## 2017-09-29 DIAGNOSIS — R410 Disorientation, unspecified: Secondary | ICD-10-CM | POA: Insufficient documentation

## 2017-10-01 LAB — CULTURE, BLOOD (ROUTINE X 2)
Culture: NO GROWTH
Culture: NO GROWTH
SPECIAL REQUESTS: ADEQUATE
Special Requests: ADEQUATE

## 2017-10-02 LAB — CULTURE, BLOOD (ROUTINE X 2)
Culture: NO GROWTH
Culture: NO GROWTH
Special Requests: ADEQUATE
Special Requests: ADEQUATE

## 2017-10-03 ENCOUNTER — Inpatient Hospital Stay
Admission: RE | Admit: 2017-10-03 | Discharge: 2017-11-06 | Disposition: A | Discharge status: Home / Self Care | Payer: Medicare Other | Source: Ambulatory Visit | Attending: Internal Medicine | Admitting: Internal Medicine

## 2017-10-03 DIAGNOSIS — Z880 Allergy status to penicillin: Secondary | ICD-10-CM | POA: Diagnosis not present

## 2017-10-03 DIAGNOSIS — R5381 Other malaise: Secondary | ICD-10-CM | POA: Diagnosis not present

## 2017-10-03 DIAGNOSIS — R52 Pain, unspecified: Secondary | ICD-10-CM

## 2017-10-03 DIAGNOSIS — R918 Other nonspecific abnormal finding of lung field: Secondary | ICD-10-CM | POA: Diagnosis not present

## 2017-10-03 DIAGNOSIS — I25119 Atherosclerotic heart disease of native coronary artery with unspecified angina pectoris: Secondary | ICD-10-CM | POA: Diagnosis not present

## 2017-10-03 DIAGNOSIS — I2510 Atherosclerotic heart disease of native coronary artery without angina pectoris: Secondary | ICD-10-CM | POA: Diagnosis not present

## 2017-10-03 DIAGNOSIS — Z87891 Personal history of nicotine dependence: Secondary | ICD-10-CM | POA: Diagnosis not present

## 2017-10-03 DIAGNOSIS — J383 Other diseases of vocal cords: Secondary | ICD-10-CM | POA: Diagnosis not present

## 2017-10-03 DIAGNOSIS — A419 Sepsis, unspecified organism: Secondary | ICD-10-CM | POA: Diagnosis not present

## 2017-10-03 DIAGNOSIS — E785 Hyperlipidemia, unspecified: Secondary | ICD-10-CM | POA: Diagnosis not present

## 2017-10-03 DIAGNOSIS — K52831 Collagenous colitis: Secondary | ICD-10-CM | POA: Diagnosis not present

## 2017-10-03 DIAGNOSIS — R1313 Dysphagia, pharyngeal phase: Secondary | ICD-10-CM | POA: Diagnosis not present

## 2017-10-03 DIAGNOSIS — M0680 Other specified rheumatoid arthritis, unspecified site: Secondary | ICD-10-CM | POA: Diagnosis not present

## 2017-10-03 DIAGNOSIS — K219 Gastro-esophageal reflux disease without esophagitis: Secondary | ICD-10-CM | POA: Diagnosis not present

## 2017-10-03 DIAGNOSIS — Z5189 Encounter for other specified aftercare: Secondary | ICD-10-CM | POA: Diagnosis not present

## 2017-10-03 DIAGNOSIS — M25422 Effusion, left elbow: Secondary | ICD-10-CM | POA: Diagnosis not present

## 2017-10-03 DIAGNOSIS — R278 Other lack of coordination: Secondary | ICD-10-CM | POA: Diagnosis not present

## 2017-10-03 DIAGNOSIS — T17908A Unspecified foreign body in respiratory tract, part unspecified causing other injury, initial encounter: Principal | ICD-10-CM

## 2017-10-03 DIAGNOSIS — R531 Weakness: Secondary | ICD-10-CM | POA: Diagnosis not present

## 2017-10-03 DIAGNOSIS — R1312 Dysphagia, oropharyngeal phase: Secondary | ICD-10-CM | POA: Diagnosis not present

## 2017-10-03 DIAGNOSIS — R94131 Abnormal electromyogram [EMG]: Secondary | ICD-10-CM | POA: Diagnosis not present

## 2017-10-03 DIAGNOSIS — Z9989 Dependence on other enabling machines and devices: Secondary | ICD-10-CM | POA: Diagnosis not present

## 2017-10-03 DIAGNOSIS — R609 Edema, unspecified: Secondary | ICD-10-CM | POA: Diagnosis not present

## 2017-10-03 DIAGNOSIS — C801 Malignant (primary) neoplasm, unspecified: Secondary | ICD-10-CM | POA: Diagnosis not present

## 2017-10-03 DIAGNOSIS — Z9089 Acquired absence of other organs: Secondary | ICD-10-CM | POA: Diagnosis not present

## 2017-10-03 DIAGNOSIS — E782 Mixed hyperlipidemia: Secondary | ICD-10-CM | POA: Diagnosis not present

## 2017-10-03 DIAGNOSIS — K529 Noninfective gastroenteritis and colitis, unspecified: Secondary | ICD-10-CM | POA: Diagnosis not present

## 2017-10-03 DIAGNOSIS — G4733 Obstructive sleep apnea (adult) (pediatric): Secondary | ICD-10-CM | POA: Diagnosis not present

## 2017-10-03 DIAGNOSIS — R1310 Dysphagia, unspecified: Secondary | ICD-10-CM | POA: Diagnosis not present

## 2017-10-03 DIAGNOSIS — I959 Hypotension, unspecified: Secondary | ICD-10-CM | POA: Diagnosis not present

## 2017-10-03 DIAGNOSIS — G319 Degenerative disease of nervous system, unspecified: Secondary | ICD-10-CM | POA: Diagnosis not present

## 2017-10-03 DIAGNOSIS — K449 Diaphragmatic hernia without obstruction or gangrene: Secondary | ICD-10-CM | POA: Diagnosis not present

## 2017-10-03 DIAGNOSIS — M4802 Spinal stenosis, cervical region: Secondary | ICD-10-CM | POA: Diagnosis not present

## 2017-10-03 DIAGNOSIS — G629 Polyneuropathy, unspecified: Secondary | ICD-10-CM | POA: Diagnosis not present

## 2017-10-03 DIAGNOSIS — J90 Pleural effusion, not elsewhere classified: Secondary | ICD-10-CM | POA: Diagnosis not present

## 2017-10-03 DIAGNOSIS — J449 Chronic obstructive pulmonary disease, unspecified: Secondary | ICD-10-CM | POA: Diagnosis not present

## 2017-10-03 DIAGNOSIS — Z431 Encounter for attention to gastrostomy: Secondary | ICD-10-CM | POA: Diagnosis not present

## 2017-10-03 DIAGNOSIS — H9191 Unspecified hearing loss, right ear: Secondary | ICD-10-CM | POA: Diagnosis not present

## 2017-10-03 DIAGNOSIS — G4730 Sleep apnea, unspecified: Secondary | ICD-10-CM | POA: Diagnosis not present

## 2017-10-03 DIAGNOSIS — M8589 Other specified disorders of bone density and structure, multiple sites: Secondary | ICD-10-CM | POA: Diagnosis not present

## 2017-10-03 DIAGNOSIS — M109 Gout, unspecified: Secondary | ICD-10-CM | POA: Diagnosis not present

## 2017-10-03 DIAGNOSIS — S0990XD Unspecified injury of head, subsequent encounter: Secondary | ICD-10-CM | POA: Diagnosis not present

## 2017-10-03 DIAGNOSIS — G729 Myopathy, unspecified: Secondary | ICD-10-CM | POA: Diagnosis not present

## 2017-10-03 DIAGNOSIS — M6281 Muscle weakness (generalized): Secondary | ICD-10-CM | POA: Diagnosis not present

## 2017-10-03 DIAGNOSIS — B351 Tinea unguium: Secondary | ICD-10-CM | POA: Diagnosis not present

## 2017-10-03 DIAGNOSIS — I350 Nonrheumatic aortic (valve) stenosis: Secondary | ICD-10-CM | POA: Diagnosis not present

## 2017-10-03 DIAGNOSIS — Z79899 Other long term (current) drug therapy: Secondary | ICD-10-CM | POA: Diagnosis not present

## 2017-10-03 DIAGNOSIS — Z9079 Acquired absence of other genital organ(s): Secondary | ICD-10-CM | POA: Diagnosis not present

## 2017-10-03 DIAGNOSIS — R51 Headache: Secondary | ICD-10-CM | POA: Diagnosis not present

## 2017-10-03 DIAGNOSIS — J189 Pneumonia, unspecified organism: Secondary | ICD-10-CM | POA: Diagnosis not present

## 2017-10-03 DIAGNOSIS — R41841 Cognitive communication deficit: Secondary | ICD-10-CM | POA: Diagnosis not present

## 2017-10-03 DIAGNOSIS — Z7409 Other reduced mobility: Secondary | ICD-10-CM | POA: Diagnosis not present

## 2017-10-03 DIAGNOSIS — M069 Rheumatoid arthritis, unspecified: Secondary | ICD-10-CM | POA: Diagnosis not present

## 2017-10-03 DIAGNOSIS — I739 Peripheral vascular disease, unspecified: Secondary | ICD-10-CM | POA: Diagnosis not present

## 2017-10-03 DIAGNOSIS — Z888 Allergy status to other drugs, medicaments and biological substances status: Secondary | ICD-10-CM | POA: Diagnosis not present

## 2017-10-03 DIAGNOSIS — Z881 Allergy status to other antibiotic agents status: Secondary | ICD-10-CM | POA: Diagnosis not present

## 2017-10-03 DIAGNOSIS — R627 Adult failure to thrive: Secondary | ICD-10-CM | POA: Diagnosis not present

## 2017-10-03 DIAGNOSIS — M1000 Idiopathic gout, unspecified site: Secondary | ICD-10-CM | POA: Diagnosis not present

## 2017-10-03 DIAGNOSIS — K222 Esophageal obstruction: Secondary | ICD-10-CM | POA: Diagnosis not present

## 2017-10-03 DIAGNOSIS — R2689 Other abnormalities of gait and mobility: Secondary | ICD-10-CM | POA: Diagnosis not present

## 2017-10-03 DIAGNOSIS — I10 Essential (primary) hypertension: Secondary | ICD-10-CM | POA: Diagnosis not present

## 2017-10-03 DIAGNOSIS — E43 Unspecified severe protein-calorie malnutrition: Secondary | ICD-10-CM | POA: Diagnosis not present

## 2017-10-03 DIAGNOSIS — M1288 Other specific arthropathies, not elsewhere classified, other specified site: Secondary | ICD-10-CM | POA: Diagnosis not present

## 2017-10-03 DIAGNOSIS — M7989 Other specified soft tissue disorders: Secondary | ICD-10-CM | POA: Diagnosis not present

## 2017-10-03 DIAGNOSIS — Z8546 Personal history of malignant neoplasm of prostate: Secondary | ICD-10-CM | POA: Diagnosis not present

## 2017-10-03 DIAGNOSIS — R634 Abnormal weight loss: Secondary | ICD-10-CM | POA: Diagnosis not present

## 2017-10-03 DIAGNOSIS — R410 Disorientation, unspecified: Secondary | ICD-10-CM | POA: Diagnosis not present

## 2017-10-03 DIAGNOSIS — Z931 Gastrostomy status: Secondary | ICD-10-CM | POA: Diagnosis not present

## 2017-10-03 DIAGNOSIS — Z955 Presence of coronary angioplasty implant and graft: Secondary | ICD-10-CM | POA: Diagnosis not present

## 2017-10-03 DIAGNOSIS — R1314 Dysphagia, pharyngoesophageal phase: Secondary | ICD-10-CM | POA: Diagnosis not present

## 2017-10-03 DIAGNOSIS — R490 Dysphonia: Secondary | ICD-10-CM | POA: Diagnosis not present

## 2017-10-03 DIAGNOSIS — B3781 Candidal esophagitis: Secondary | ICD-10-CM | POA: Diagnosis not present

## 2017-10-03 DIAGNOSIS — J181 Lobar pneumonia, unspecified organism: Secondary | ICD-10-CM | POA: Diagnosis not present

## 2017-10-03 DIAGNOSIS — Z7982 Long term (current) use of aspirin: Secondary | ICD-10-CM | POA: Diagnosis not present

## 2017-10-05 ENCOUNTER — Encounter: Payer: Self-pay | Admitting: Internal Medicine

## 2017-10-05 ENCOUNTER — Ambulatory Visit (HOSPITAL_COMMUNITY): Payer: Medicare Other

## 2017-10-05 ENCOUNTER — Non-Acute Institutional Stay (SKILLED_NURSING_FACILITY): Payer: Medicare Other | Admitting: Internal Medicine

## 2017-10-05 ENCOUNTER — Encounter (HOSPITAL_COMMUNITY): Payer: Self-pay

## 2017-10-05 DIAGNOSIS — R531 Weakness: Secondary | ICD-10-CM

## 2017-10-05 DIAGNOSIS — E43 Unspecified severe protein-calorie malnutrition: Secondary | ICD-10-CM

## 2017-10-05 DIAGNOSIS — K52831 Collagenous colitis: Secondary | ICD-10-CM | POA: Diagnosis not present

## 2017-10-05 DIAGNOSIS — R410 Disorientation, unspecified: Secondary | ICD-10-CM

## 2017-10-05 NOTE — Progress Notes (Signed)
Provider: Veleta Miners Location:   [facility] Room Number: 159/P Place of Service:  SNF (31)  PCP: [name], MD Patient Care Team: [name], MD as PCP - General (Family Medicine) [name], MD as Consulting Physician (Gastroenterology)  Extended Emergency Contact Information Primary Emergency Contact: Luan Pulling Address: [address]          [place], [place] [address] [name] of [place] Phone: [phone] Mobile Phone: [phone] Relation: Spouse  Code Status: Full Code Goals of Care: Advanced Directive information Advanced Directives 10/05/2017  Does Patient Have a Medical Advance Directive? Yes  Type of Advance Directive (No Data)  Does patient want to make changes to medical advance directive? No - Patient declined      Chief Complaint  Patient presents with  . Readmit To SNF    Readmission Visit    HPI: Patient is a 81 y.o. male seen today for admission to SNF for therapy.  Patient has multiple problems including Collagenous Colitis With Chronic Diarrhea,  CAD S/P Stents, HTN, GERD, Gout, Chronic Transaminitis, Hyperlipidemia, Rheumatoid arthritis, Chronic Peripheral Neuropathy, Prostate cancer s/p Prostatectomy, basal cell skin cancer, Oral Lichen Planus  Patient was in the facility after hospitalization for Weakness and diarrhea . He was in the [facility] from 08/24-09/07.He had PEG tube placed on 08/26.  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 ALS was ruled out on Biopsy. Patient stayed in the [facility] rehab for 2 weeks and was discharged to SNF for Further his therapy. In the Facility patient developed  Fever and was transferred to [facility] 09/30 His work up including Chest Xray and blood and urine cultures were negative.He was treated empirically with Aztreonam and Vancomycin . He was also started on Stress Steroids and he  got better. With Plan to taper steroids slowly.  He also developed delirium ans it was thought to be due to combination of Medicines.  He is back in the facility. He has started to work with therapy. He says he is feeling much better Continue to have swelling in his legs. But No Chest pain or SOB or Cough.or fever.     Past Medical History:  Diagnosis Date  . Collagenous colitis   . Coronary atherosclerosis of native coronary artery    DES RCA 5/11, LVEF 55%  . Essential hypertension, benign   . GERD (gastroesophageal reflux disease)   . Hiatal hernia   . Mixed hyperlipidemia   . Obstructive sleep apnea   . Prostate cancer ([place])   . Pulmonary nodules   . Rheumatoid arthritis [facility])    Past Surgical History:  Procedure Laterality Date  . APPENDECTOMY    . COLONOSCOPY  06/2016   Dr. Britta Mccreedy: mild diverticulosis in left colon, sessile polyp 3-7 mm in distal descending colon, biopsies of colon to assess for microscopic colitis. path: collagenous colitis. colon polyp was benign polypoid colorectal mucosa   . COLONOSCOPY  04/2014   Dr. Britta Mccreedy: office notes stated 3 tubular adenomas and diverticulosis of left colon   . CORONARY ANGIOPLASTY WITH STENT PLACEMENT  2011  . ESOPHAGOGASTRODUODENOSCOPY  06/2016   Dr. Britta Mccreedy: gastritis, path: chronic active gastritis, negative H.pylori   . HEMORRHOID SURGERY    . LAPAROSCOPIC CHOLECYSTECTOMY    . PROSTATECTOMY    . Skin cancer resection      reports that he quit smoking about 25 years ago. His smoking use included Cigarettes. He started smoking  about 65 years ago. He has a 40.00 pack-year smoking history. He has never used smokeless tobacco. He reports that he drinks alcohol. He reports that he does not use drugs. Social History   Social History  . Marital status: Married    Spouse name: N/A  . Number of children: N/A  . Years of education: N/A   Occupational History  . Not on file.   Social History Main Topics  . Smoking status:  Former Smoker    Packs/day: 1.00    Years: 40.00    Types: Cigarettes    Start date: 12/30/1951    Quit date: 12/30/1991  . Smokeless tobacco: Never Used  . Alcohol use 0.0 oz/week     Comment: 1 happy hour beer a day   . Drug use: No  . Sexual activity: Not on file   Other Topics Concern  . Not on file   Social History Narrative  . No narrative on file    Functional Status Survey:    Family History  Problem Relation Age of Onset  . Colon cancer Mother 96  . Hypertension Unknown     Health Maintenance  Topic Date Due  . INFLUENZA VACCINE  11/28/2017 (Originally 07/29/2017)  . TETANUS/TDAP  11/28/2017 (Originally 10/31/1955)  . PNA vac Low Risk Adult (1 of 2 - PCV13) 11/28/2017 (Originally 11/01/2003)    Allergies  Allergen Reactions  . Cefprozil   . Doxycycline Hives and Itching  . Pineapple     Other reaction(s): Bleeding (intolerance) Mouth burns and bleeds  . Zenpep [Pancrelipase (Lip-Prot-Amyl)]     Patient does not recall reaction but states he had to stop because it was "bad". I have listed in allergies, as we are not able to determine what this was.   . Levofloxacin Rash    Unknown REACTION: rash  . Penicillins Rash    REACTION: hives  . Statins Other (See Comments) and Rash    aching aching    Allergies as of 10/05/2017      Reactions   Cefprozil    Doxycycline Hives, Itching   Pineapple    Other reaction(s): Bleeding (intolerance) Mouth burns and bleeds   Zenpep [pancrelipase (lip-prot-amyl)]    Patient does not recall reaction but states he had to stop because it was "bad". I have listed in allergies, as we are not able to determine what this was.    Levofloxacin Rash   Unknown REACTION: rash   Penicillins Rash   REACTION: hives   Statins Other (See Comments), Rash   aching aching      Medication List       Accurate as of 10/05/17 10:47 AM. Always use your most recent med list.          acetaminophen 100 MG/ML solution Commonly known  as:  TYLENOL Give 15.6 mls by Per G tube route every 6 hours as needed for mild pain or headache   acetaminophen 500 MG tablet Commonly known as:  TYLENOL Take 500 mg by mouth 2 (two) times daily as needed for mild pain or moderate pain.   allopurinol 100 MG tablet Commonly known as:  ZYLOPRIM Place 100 mg into feeding tube 2 (two) times daily.   aspirin 81 MG chewable tablet Place 81 mg into feeding tube daily.   budesonide 0.5 MG/2ML nebulizer solution Commonly known as:  PULMICORT Take 0.5 mg by nebulization 3 (three) times daily.   calcium-vitamin D 250-125 MG-UNIT tablet Commonly known as:  OSCAL WITH  D Place 1 tablet into feeding tube daily.   cevimeline 30 MG capsule Commonly known as:  EVOXAC Place 30 mg into feeding tube 3 (three) times daily. May add 1 tab at bedtime as needed.   Cholecalciferol 1000 units capsule Place 1,000 Units into feeding tube daily.   clobetasol 0.05 % Gel Commonly known as:  TEMOVATE Apply topically as needed.   feeding supplement (JEVITY 1.5 CAL) Liqd Place 200 mLs into feeding tube every 4 (four) hours.   gabapentin 250 MG/5ML solution Commonly known as:  NEURONTIN Place 300 mg into feeding tube 3 (three) times daily.   LACTINEX Pack 1 packet; gastric tube. Mix with water   MAGONATE 54 MG/5ML Liqd Generic drug:  Magnesium Carbonate Place 54 mg into feeding tube daily.   metoprolol tartrate 25 MG tablet Commonly known as:  LOPRESSOR TAKE ONE-HALF (1/2) TABLET TWICE A DAY   nitroGLYCERIN 0.4 MG SL tablet Commonly known as:  NITROSTAT Place 1 tablet (0.4 mg total) under the tongue every 5 (five) minutes as needed.   NON FORMULARY 150 ml free water by Peg Tube qid. Keep HOB elevated 45 degrees during all flushes and bolus feeds. Four times a day   pantoprazole sodium 40 mg/20 mL Pack Commonly known as:  PROTONIX Place 40 mg into feeding tube 2 (two) times daily.   predniSONE 10 MG tablet Commonly known as:   DELTASONE Take 40 mg by mouth daily. Starting 10/04/2017- 10/06/2017   predniSONE 10 MG tablet Commonly known as:  DELTASONE Take 30 mg by mouth daily with breakfast. Starting 10/07/2017-10/10/2017   predniSONE 10 MG tablet Commonly known as:  DELTASONE Take 20 mg by mouth daily with breakfast. Starting 10/11/2017-10/14/2017   predniSONE 10 MG tablet Commonly known as:  DELTASONE Take 10 mg by mouth daily with breakfast. Starting 10/15/2017-10/18/2017   SANTYL ointment Generic drug:  collagenase Apply 1 application topically daily. Apply to sacral wound   vitamin B-12 1000 MCG tablet Commonly known as:  CYANOCOBALAMIN Place 1,000 mcg into feeding tube daily.       Review of Systems  Constitutional: Positive for activity change, appetite change and fatigue.  Respiratory: Positive for cough.   Cardiovascular: Positive for leg swelling.  Genitourinary: Positive for frequency.  Musculoskeletal: Positive for back pain.  Skin: Positive for wound.  Neurological: Positive for weakness.  Psychiatric/Behavioral: Negative.   All other systems reviewed and are negative.   Vitals:   10/05/17 1046  BP: 108/70  Pulse: 66  Resp: 18  Temp: (!) 97.3 F (36.3 C)  TempSrc: Oral   There is no height or weight on file to calculate BMI. Physical Exam  Constitutional: He is oriented to person, place, and time. He appears well-developed and well-nourished.  HENT:  Head: Normocephalic.  Mouth/Throat: Oropharynx is clear and moist.  Neck: Neck supple.  Cardiovascular: Normal rate, regular rhythm and normal heart sounds.   No murmur heard. Pulmonary/Chest: Effort normal and breath sounds normal. No respiratory distress. He has no wheezes. He has no rales.  Abdominal: Soft. Bowel sounds are normal. He exhibits distension. There is no tenderness. There is no rebound and no guarding.  Musculoskeletal:  Moderate Amount of edema Bilateral  Neurological: He is alert and oriented to person,  place, and time.  No Focal deficits  Skin:  Has Unstageble Decub in the sacral area    Labs reviewed: Basic Metabolic Panel:  Recent Labs  09/24/17 0750 09/26/17 1425 09/27/17 1125  NA 136 133* 133*  K 3.7  3.8 4.1  CL 103 101 102  CO2 25 26 23   GLUCOSE 132* 193* 207*  BUN 14 15 15   CREATININE 0.50* 0.65 0.59*  CALCIUM 7.6* 7.8* 7.8*   Liver Function Tests:  Recent Labs  09/24/17 0750 09/26/17 1425 09/27/17 1125  AST 36 39 31  ALT 38 38 29  ALKPHOS 99 102 105  BILITOT 0.8 1.2 1.1  PROT 4.9* 5.6* 5.3*  ALBUMIN 1.8* 2.1* 1.9*    Recent Labs  08/15/17 1206  LIPASE 31    Recent Labs  09/24/17 0750  AMMONIA 37*   CBC:  Recent Labs  09/22/17 0403 09/24/17 0750 09/26/17 1425 09/27/17 1125  WBC 5.2 5.5 7.5 6.9  NEUTROABS 2.8  --  3.7 4.0  HGB 10.8* 10.5* 11.0* 10.4*  HCT 34.3* 32.8* 34.7* 32.1*  MCV 103.3* 102.8* 101.8* 101.3*  PLT 93* 99* 85* 84*   Cardiac Enzymes: No results for input(s): CKTOTAL, CKMB, CKMBINDEX, TROPONINI in the last 8760 hours. BNP: Invalid input(s): POCBNP No results found for: HGBA1C Lab Results  Component Value Date   TSH 0.480 08/15/2017   No results found for: VITAMINB12 No results found for: FOLATE No results found for: IRON, TIBC, FERRITIN  Imaging and Procedures obtained prior to SNF admission: Dg Chest Port 1 View  Result Date: 09/27/2017 CLINICAL DATA:  Sepsis. EXAM: PORTABLE CHEST 1 VIEW COMPARISON:  09/22/2017 FINDINGS: The cardiomediastinal silhouette is unchanged and within normal limits. The lungs remain hypoinflated, and there is slight elevation of the right hemidiaphragm. No lobar consolidation, edema, sizable pleural effusion, or pneumothorax is identified. IMPRESSION: Hypoinflation.  No evidence of pneumonia. Electronically Signed   By: Logan Bores M.D.   On: 09/27/2017 12:09    Assessment/Plan  Fever with hypotension With Infectious etiology ruled out it was thought to be due to Adrenal  insufficieny and was placed on Prednisone slow taper. Patient is afebrile with no Leukocytosis. Will continue to monitor.  Delirium Resolved. Meds reviewed. Will Stay away from Hypnotics. Rapidly Progressive Weakness with failure to thrive Will restart therapy. Also reschedule his MRI of head and Cervical spine. Continue on Tube feeds.  Dysphagia Continue Thin clear liquid  D/W family again that patient needs to be careful when he is taking it orally. Reevaluation in 2 weeks by Speech  Collagenous colitis Diarrhea is actually better on Budesonide. Needs follow up with Dr Gala Romney  CAD continue on  Aspirin,and Metoprolol  Anemia and Thrombocytopenia Most likely due to chronic Disease. Will follow. Will also  Get Vit B12 level due to macrocytosis  Mild cirrhosis with Ascitics portal hypertension and Low albumin Will Need GI follow up.   Gout Stable on Allopurinol Neuropathy Unknown etiology On Neurontin.   Family/ staff Communication:   Labs/tests ordered: CMP, Vit B12, CBC in 1 week.  Total time spent in this patient care encounter was 45_ minutes; greater than 50% of the visit spent counseling patient, reviewing records , Labs and coordinating care for problems addressed at this encounter.

## 2017-10-06 ENCOUNTER — Ambulatory Visit (HOSPITAL_COMMUNITY): Payer: Medicare Other | Attending: Internal Medicine

## 2017-10-06 ENCOUNTER — Non-Acute Institutional Stay (SKILLED_NURSING_FACILITY): Payer: Medicare Other | Admitting: Internal Medicine

## 2017-10-06 ENCOUNTER — Encounter: Payer: Self-pay | Admitting: Internal Medicine

## 2017-10-06 DIAGNOSIS — J90 Pleural effusion, not elsewhere classified: Secondary | ICD-10-CM | POA: Diagnosis not present

## 2017-10-06 DIAGNOSIS — J189 Pneumonia, unspecified organism: Secondary | ICD-10-CM

## 2017-10-06 DIAGNOSIS — M109 Gout, unspecified: Secondary | ICD-10-CM

## 2017-10-06 DIAGNOSIS — M7989 Other specified soft tissue disorders: Secondary | ICD-10-CM | POA: Diagnosis not present

## 2017-10-06 DIAGNOSIS — J449 Chronic obstructive pulmonary disease, unspecified: Secondary | ICD-10-CM | POA: Diagnosis not present

## 2017-10-06 DIAGNOSIS — M25422 Effusion, left elbow: Secondary | ICD-10-CM | POA: Diagnosis not present

## 2017-10-06 DIAGNOSIS — T17908A Unspecified foreign body in respiratory tract, part unspecified causing other injury, initial encounter: Secondary | ICD-10-CM | POA: Diagnosis not present

## 2017-10-06 DIAGNOSIS — X58XXXA Exposure to other specified factors, initial encounter: Secondary | ICD-10-CM | POA: Insufficient documentation

## 2017-10-06 DIAGNOSIS — R52 Pain, unspecified: Secondary | ICD-10-CM | POA: Diagnosis not present

## 2017-10-06 DIAGNOSIS — I959 Hypotension, unspecified: Secondary | ICD-10-CM | POA: Diagnosis not present

## 2017-10-06 DIAGNOSIS — R918 Other nonspecific abnormal finding of lung field: Secondary | ICD-10-CM | POA: Insufficient documentation

## 2017-10-06 NOTE — Progress Notes (Signed)
Location:   [facility] Room Number: 159/P Place of Service:  SNF (31) Provider:  Ree Edman, Silvestre Moment, MD  Patient Care Team: [name], MD as PCP - General (Family Medicine) [name], MD as Consulting Physician (Gastroenterology)  Extended Emergency Contact Information Primary Emergency Contact: Luan Pulling Address: [address]          [place], [place] [address] [name] of [place] Phone: [phone] Mobile Phone: [phone] Relation: Spouse  Code Status:  Full Code Goals of care: Advanced Directive information Advanced Directives 10/06/2017  Does Patient Have a Medical Advance Directive? Yes  Type of Advance Directive (No Data)  Does patient want to make changes to medical advance directive? No - Patient declined     Chief Complaint  Patient presents with  . Acute Visit    For Fever , Cough and Left Elbow swelling.    HPI:  Pt is a 81 y.o. male seen today for an acute visit for low grade fever, Weakness, Hypotension and Left Elbow swelling. Patient has multiple problems including Collagenous Colitis With Chronic Diarrhea, CAD S/P Stents, HTN, GERD, Gout, Chronic Transaminitis, Hyperlipidemia, Rheumatoid arthritis, Chronic Peripheral Neuropathy, Prostate cancer s/p Prostatectomy, basal cell skin cancer, Oral Lichen Planus  Patient was in the facility after hospitalization for Weakness and diarrhea . He was in the [facility] from 08/24-09/07.He had PEG tube placed on 08/26.  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 ALS was ruled out on Biopsy. Patient stayed in the [facility] rehab for 2 weeks and was discharged to SNF for Further his therapy. In the Facility patient developed  Fever and was transferred to [facility] 09/30 His work up including Chest Xray and blood and urine cultures were negative.He was treated empirically with  Aztreonam and Vancomycin for few days . He was also started on Stress Steroids and he got better. With Plan to taper steroids slowly.  He also developed delirium ans it was thought to be due to Medicines  Patient was send back to facility few days ago. But he developed low grade fever this morning with Cough, Weakness. His BP sitting in the chair was 80/60 and he had increased swelling in his legs.. Patient says he feels weak and tired.  He also has swelling and pain  in his Left Elbow and Right 3 rd toe. He thinks he hit his elbow while in chair.   Past Medical History:  Diagnosis Date  . Collagenous colitis   . Coronary atherosclerosis of native coronary artery    DES RCA 5/11, LVEF 55%  . Essential hypertension, benign   . GERD (gastroesophageal reflux disease)   . Hiatal hernia   . Mixed hyperlipidemia   . Obstructive sleep apnea   . Prostate cancer ([place])   . Pulmonary nodules   . Rheumatoid arthritis [facility])    Past Surgical History:  Procedure Laterality Date  . APPENDECTOMY    . COLONOSCOPY  06/2016   Dr. Britta Mccreedy: mild diverticulosis in left colon, sessile polyp 3-7 mm in distal descending colon, biopsies of colon to assess for microscopic colitis. path: collagenous colitis. colon polyp was benign polypoid colorectal mucosa   . COLONOSCOPY  04/2014   Dr. Britta Mccreedy: office notes stated 3 tubular adenomas and diverticulosis of left colon   . CORONARY ANGIOPLASTY WITH STENT PLACEMENT  2011  . ESOPHAGOGASTRODUODENOSCOPY  06/2016   Dr. Britta Mccreedy: gastritis, path: chronic active gastritis, negative H.pylori   .  HEMORRHOID SURGERY    . LAPAROSCOPIC CHOLECYSTECTOMY    . PROSTATECTOMY    . Skin cancer resection      Allergies  Allergen Reactions  . Cefprozil   . Doxycycline Hives and Itching  . Pineapple     Other reaction(s): Bleeding (intolerance) Mouth burns and bleeds  . Zenpep [Pancrelipase (Lip-Prot-Amyl)]     Patient does not recall reaction but states he had to stop because  it was "bad". I have listed in allergies, as we are not able to determine what this was.   . Levofloxacin Rash    Unknown REACTION: rash  . Penicillins Rash    REACTION: hives  . Statins Other (See Comments) and Rash    aching aching    Outpatient Encounter Prescriptions as of 10/06/2017  Medication Sig  . acetaminophen (TYLENOL) 100 MG/ML solution Give 15.6 mls by Per G tube route every 6 hours as needed for mild pain or headache  . acetaminophen (TYLENOL) 500 MG tablet Take 500 mg by mouth 2 (two) times daily as needed for mild pain or moderate pain.  Marland Kitchen allopurinol (ZYLOPRIM) 100 MG tablet Place 100 mg into feeding tube 2 (two) times daily.  Marland Kitchen aspirin 81 MG chewable tablet Place 81 mg into feeding tube daily.  . budesonide (PULMICORT) 0.5 MG/2ML nebulizer solution Take 0.5 mg by nebulization 3 (three) times daily.  . calcium-vitamin D (OSCAL WITH D) 250-125 MG-UNIT tablet Place 1 tablet into feeding tube daily.  . cevimeline (EVOXAC) 30 MG capsule Place 30 mg into feeding tube 3 (three) times daily. May add 1 tab at bedtime as needed.  . Cholecalciferol 1000 units capsule Place 1,000 Units into feeding tube daily.  . clobetasol (TEMOVATE) 0.05 % GEL Apply topically as needed.  . collagenase (SANTYL) ointment Apply 1 application topically daily. Apply to sacral wound  . gabapentin (NEURONTIN) 250 MG/5ML solution Place 300 mg into feeding tube 3 (three) times daily.  . Lactobacillus (LACTINEX) PACK 1 packet; gastric tube. Mix with water  . Magnesium Carbonate (MAGONATE) 54 MG/5ML LIQD Place 54 mg into feeding tube daily.  . metoprolol tartrate (LOPRESSOR) 25 MG tablet TAKE ONE-HALF (1/2) TABLET TWICE A DAY  . nitroGLYCERIN (NITROSTAT) 0.4 MG SL tablet Place 1 tablet (0.4 mg total) under the tongue every 5 (five) minutes as needed.  . NON FORMULARY 150 ml free water by Peg Tube qid. Keep HOB elevated 45 degrees during all flushes and bolus feeds. Four times a day  . Nutritional Supplements  (FEEDING SUPPLEMENT, JEVITY 1.5 CAL,) LIQD Place 200 mLs into feeding tube every 4 (four) hours.   . pantoprazole sodium (PROTONIX) 40 mg/20 mL PACK Place 40 mg into feeding tube 2 (two) times daily.  . predniSONE (DELTASONE) 10 MG tablet Take 40 mg by mouth daily. Starting 10/04/2017- 10/06/2017  . predniSONE (DELTASONE) 10 MG tablet Take 30 mg by mouth daily with breakfast. Starting 10/07/2017-10/10/2017  . predniSONE (DELTASONE) 10 MG tablet Take 20 mg by mouth daily with breakfast. Starting 10/11/2017-10/14/2017  . predniSONE (DELTASONE) 10 MG tablet Take 10 mg by mouth daily with breakfast. Starting 10/15/2017-10/18/2017  . vitamin B-12 (CYANOCOBALAMIN) 1000 MCG tablet Place 1,000 mcg into feeding tube daily.   No facility-administered encounter medications on file as of 10/06/2017.      Review of Systems  Constitutional: Positive for activity change, appetite change and fever.  HENT: Negative.   Respiratory: Positive for cough.   Cardiovascular: Positive for leg swelling.  Gastrointestinal: Negative.   Genitourinary: Negative.  Musculoskeletal: Negative.   Skin: Negative.   Neurological: Positive for weakness.  Psychiatric/Behavioral: Negative.   All other systems reviewed and are negative.   Immunization History  Administered Date(s) Administered  . Influenza-Unspecified 08/29/2014   Pertinent  Health Maintenance Due  Topic Date Due  . INFLUENZA VACCINE  11/28/2017 (Originally 07/29/2017)  . PNA vac Low Risk Adult (1 of 2 - PCV13) 11/28/2017 (Originally 11/01/2003)   Fall Risk  08/26/2016  Falls in the past year? Yes  Comment Emmi Telephone Survey: data to providers prior to load  Number falls in past yr: 1  Comment Emmi Telephone Survey Actual Response = 1  Injury with Fall? Yes   Functional Status Survey:    Vitals:   10/06/17 1027  BP: 111/71  Pulse: 89  Resp: 18  Temp: 100 F (37.8 C)  TempSrc: Oral  SpO2: 95%   There is no height or weight on file to  calculate BMI. Physical Exam  Constitutional: He is oriented to person, place, and time. He appears well-developed.  HENT:  Head: Normocephalic.  Mouth/Throat: Oropharynx is clear and moist.  Eyes: Pupils are equal, round, and reactive to light.  Neck: Neck supple.  Cardiovascular: Normal rate and normal heart sounds.   No murmur heard. Pulmonary/Chest: Effort normal.  Decreased Breadth Sounds Bilateral  Abdominal: Soft. Bowel sounds are normal. He exhibits no distension. There is no tenderness. There is no rebound.  Musculoskeletal:  Significant Edema Bilateral  Neurological: He is alert and oriented to person, place, and time.  Skin: Skin is warm and dry.  Psychiatric: He has a normal mood and affect. His behavior is normal. Thought content normal.    Labs reviewed:  Recent Labs  09/24/17 0750 09/26/17 1425 09/27/17 1125  NA 136 133* 133*  K 3.7 3.8 4.1  CL 103 101 102  CO2 25 26 23   GLUCOSE 132* 193* 207*  BUN 14 15 15   CREATININE 0.50* 0.65 0.59*  CALCIUM 7.6* 7.8* 7.8*    Recent Labs  09/24/17 0750 09/26/17 1425 09/27/17 1125  AST 36 39 31  ALT 38 38 29  ALKPHOS 99 102 105  BILITOT 0.8 1.2 1.1  PROT 4.9* 5.6* 5.3*  ALBUMIN 1.8* 2.1* 1.9*    Recent Labs  09/22/17 0403 09/24/17 0750 09/26/17 1425 09/27/17 1125  WBC 5.2 5.5 7.5 6.9  NEUTROABS 2.8  --  3.7 4.0  HGB 10.8* 10.5* 11.0* 10.4*  HCT 34.3* 32.8* 34.7* 32.1*  MCV 103.3* 102.8* 101.8* 101.3*  PLT 93* 99* 85* 84*   Lab Results  Component Value Date   TSH 0.480 08/15/2017   No results found for: HGBA1C Lab Results  Component Value Date   CHOL  05/08/2010    161        ATP III CLASSIFICATION:  <200     mg/dL   Desirable  200-239  mg/dL   Borderline High  >=240    mg/dL   High          HDL 27 (L) 05/08/2010   LDLCALC  05/08/2010    84        Total Cholesterol/HDL:CHD Risk Coronary Heart Disease Risk Table                     Men   Women  1/2 Average Risk   3.4   3.3  Average  Risk       5.0   4.4  2 X Average Risk   9.6  7.1  3 X Average Risk  23.4   11.0        Use the calculated Patient Ratio above and the CHD Risk Table to determine the patient's CHD Risk.        ATP III CLASSIFICATION (LDL):  <100     mg/dL   Optimal  100-129  mg/dL   Near or Above                    Optimal  130-159  mg/dL   Borderline  160-189  mg/dL   High  >190     mg/dL   Very High   TRIG 248 (H) 05/08/2010   CHOLHDL 6.0 05/08/2010    Significant Diagnostic Results in last 30 days:  Dg Chest 2 View  Result Date: 09/22/2017 CLINICAL DATA:  Shortness of breath. Low oxygen saturation. Leg swelling. EXAM: CHEST  2 VIEW COMPARISON:  Chest CT 08/15/2017, included portion from abdominal CT performed concurrently. FINDINGS: Low lung volumes. Bibasilar atelectasis and trace pleural effusions, pleural fluid better seen on concurrent CT. Normal heart size and mediastinal contours. No pulmonary edema. No pneumothorax. No confluent airspace disease. Wedge compression fracture of lower thoracic vertebra is chronic and unchanged. IMPRESSION: Low lung volumes with bibasilar atelectasis and trace pleural effusions. Electronically Signed   By: Jeb Levering M.D.   On: 09/22/2017 05:03   Ct Abdomen Pelvis W Contrast  Result Date: 09/22/2017 CLINICAL DATA:  Abdominal distension EXAM: CT ABDOMEN AND PELVIS WITH CONTRAST TECHNIQUE: Multidetector CT imaging of the abdomen and pelvis was performed using the standard protocol following bolus administration of intravenous contrast. CONTRAST:  165mL ISOVUE-300 IOPAMIDOL (ISOVUE-300) INJECTION 61% COMPARISON:  CT abdomen pelvis 08/15/2017 FINDINGS: Lower chest: There is bibasilar atelectasis. Trace left pleural effusion. Previously described nodule of the right lung base is not clearly visualized on this study, possibly due to adjacent atelectasis. Hepatobiliary: The liver is small with a mildly nodular contour. There is moderate perihepatic ascites. Status  post cholecystectomy. Pancreas: Normal contours without ductal dilatation. No peripancreatic fluid collection. Spleen: Unchanged low-attenuation focus in the spleen. Moderate perisplenic fluid. Adrenals/Urinary Tract: --Adrenal glands: Normal. --Right kidney/ureter: No hydronephrosis or perinephric stranding. No nephrolithiasis. No obstructing ureteral stones. --Left kidney/ureter: No hydronephrosis or perinephric stranding. No nephrolithiasis. No obstructing ureteral stones. --Urinary bladder: Unremarkable. Stomach/Bowel: --Stomach/Duodenum: There is a gastrostomy tube within the stomach. Stomach is decompressed. Normal course and caliber of the duodenum. --Small bowel: No dilatation or inflammation. --Colon: There are numerous diverticula of the sigmoid colon. The stool throughout the rectum and sigmoid colon is extremely dense and has the appearance of contrast material. However, there is no proximal enteric contrast. I suspect this contrast material is left over from the prior study. --Appendix: Surgically absent. Vascular/Lymphatic: Atherosclerotic calcification is present within the non-aneurysmal abdominal aorta, without hemodynamically significant stenosis. No abdominal or pelvic lymphadenopathy. Reproductive: No free fluid in the pelvis. Musculoskeletal. Multilevel degenerative disc disease and facet arthrosis. No bony spinal canal stenosis. Other: Large amount of fluid within the abdomen and pelvis. Fluid tracks from the perihepatic and perisplenic locations along the paracolic gutters into the lower abdomen. IMPRESSION: 1. Large amount of intraperitoneal free fluid, of uncertain origin. Correlation with albumin levels may be helpful. 2. Hyperdense stool within the distal colon, likely indicating retained contrast material. The most recent scan with enteric contrast that I have record of for this patient was performed on 08/15/17. This suggests reduced colonic motility. 3.  Aortic Atherosclerosis  (ICD10-I70.0). 4. Trace  left pleural effusion. 5. Previously described right lung base pulmonary nodule is not clearly seen on this study, possibly due to adjacent atelectasis. Electronically Signed   By: Ulyses Jarred M.D.   On: 09/22/2017 05:16   Dg Chest Port 1 View  Result Date: 09/27/2017 CLINICAL DATA:  Sepsis. EXAM: PORTABLE CHEST 1 VIEW COMPARISON:  09/22/2017 FINDINGS: The cardiomediastinal silhouette is unchanged and within normal limits. The lungs remain hypoinflated, and there is slight elevation of the right hemidiaphragm. No lobar consolidation, edema, sizable pleural effusion, or pneumothorax is identified. IMPRESSION: Hypoinflation.  No evidence of pneumonia. Electronically Signed   By: Logan Bores M.D.   On: 09/27/2017 12:09    Assessment/Plan  Right Base Infiltrate Patient is allergic to many Antibiotics Will start  him on Bactrum and Zithromax  POX Q shift Vitas Q 4 hours. Stay in bed and hold therapy till tomorrow. Repeat Labs D/W Wife. If patients spike fever he will be transferred to [facility] as per her wishes. Hypotension Patients BP did come up to systolic 098 when he was in bed and his legs were elevated. Will discontinue Metoprolol. Continue monitor Vitals Q 4 hours. Right elbow swelling Xray was positive for Gout. Patient already on Prednisone. Will continue prednisone 40 mg for another weak before tapering it slowly again. He is already on Allopurinol.   Family/ staff Communication:   Labs/tests ordered:   Total time spent in this patient care encounter was 45_ minutes; greater than 50% of the visit spent counseling patient, reviewing records , Labs and coordinating care for problems addressed at this encounter.

## 2017-10-07 ENCOUNTER — Encounter (HOSPITAL_COMMUNITY)
Admission: RE | Admit: 2017-10-07 | Discharge: 2017-10-07 | Disposition: A | Discharge status: Home / Self Care | Payer: Medicare Other | Source: Skilled Nursing Facility | Attending: Internal Medicine | Admitting: Internal Medicine

## 2017-10-07 ENCOUNTER — Non-Acute Institutional Stay (SKILLED_NURSING_FACILITY): Payer: Medicare Other | Admitting: Internal Medicine

## 2017-10-07 ENCOUNTER — Encounter: Payer: Self-pay | Admitting: Internal Medicine

## 2017-10-07 DIAGNOSIS — J90 Pleural effusion, not elsewhere classified: Secondary | ICD-10-CM | POA: Insufficient documentation

## 2017-10-07 DIAGNOSIS — M1000 Idiopathic gout, unspecified site: Secondary | ICD-10-CM

## 2017-10-07 DIAGNOSIS — I959 Hypotension, unspecified: Secondary | ICD-10-CM

## 2017-10-07 DIAGNOSIS — A419 Sepsis, unspecified organism: Secondary | ICD-10-CM | POA: Insufficient documentation

## 2017-10-07 DIAGNOSIS — J181 Lobar pneumonia, unspecified organism: Secondary | ICD-10-CM | POA: Diagnosis not present

## 2017-10-07 DIAGNOSIS — K52831 Collagenous colitis: Secondary | ICD-10-CM | POA: Insufficient documentation

## 2017-10-07 DIAGNOSIS — G629 Polyneuropathy, unspecified: Secondary | ICD-10-CM | POA: Diagnosis not present

## 2017-10-07 DIAGNOSIS — J189 Pneumonia, unspecified organism: Secondary | ICD-10-CM

## 2017-10-07 DIAGNOSIS — E43 Unspecified severe protein-calorie malnutrition: Secondary | ICD-10-CM | POA: Insufficient documentation

## 2017-10-07 DIAGNOSIS — Z5189 Encounter for other specified aftercare: Secondary | ICD-10-CM | POA: Insufficient documentation

## 2017-10-07 LAB — COMPREHENSIVE METABOLIC PANEL
ALBUMIN: 1.9 g/dL — AB (ref 3.5–5.0)
ALT: 44 U/L (ref 17–63)
ANION GAP: 7 (ref 5–15)
AST: 29 U/L (ref 15–41)
Alkaline Phosphatase: 92 U/L (ref 38–126)
BILIRUBIN TOTAL: 0.7 mg/dL (ref 0.3–1.2)
BUN: 20 mg/dL (ref 6–20)
CO2: 27 mmol/L (ref 22–32)
Calcium: 7.7 mg/dL — ABNORMAL LOW (ref 8.9–10.3)
Chloride: 104 mmol/L (ref 101–111)
Creatinine, Ser: 0.62 mg/dL (ref 0.61–1.24)
GFR calc Af Amer: >60 mL/min (ref >60)
GFR calc non Af Amer: >60 mL/min (ref >60)
GLUCOSE: 111 mg/dL — AB (ref 65–99)
POTASSIUM: 4 mmol/L (ref 3.5–5.1)
SODIUM: 138 mmol/L (ref 135–145)
TOTAL PROTEIN: 4.9 g/dL — AB (ref 6.5–8.1)

## 2017-10-07 LAB — CBC
HCT: 35.3 % — ABNORMAL LOW (ref 39.0–52.0)
Hemoglobin: 11 g/dL — ABNORMAL LOW (ref 13.0–17.0)
MCH: 32 pg (ref 26.0–34.0)
MCHC: 31.2 g/dL (ref 30.0–36.0)
MCV: 102.6 fL — AB (ref 78.0–100.0)
PLATELETS: 111 10*3/uL — AB (ref 150–400)
RBC: 3.44 MIL/uL — ABNORMAL LOW (ref 4.22–5.81)
RDW: 18.2 % — AB (ref 11.5–15.5)
WBC: 7.7 10*3/uL (ref 4.0–10.5)

## 2017-10-07 NOTE — Progress Notes (Signed)
Location:   [facility] Room Number: 159/P Place of Service:  SNF [phone]) Provider:  Halford Chessman, MD  Patient Care Team: [name], MD as PCP - General (Family Medicine) [name], MD as Consulting Physician (Gastroenterology)  Extended Emergency Contact Information Primary Emergency Contact: Luan Pulling Address: [address]          [place], [place] [address] [name] of [place] Phone: [phone] Mobile Phone: [phone] Relation: Spouse  Code Status:  Full Code Goals of care: Advanced Directive information Advanced Directives 10/07/2017  Does Patient Have a Medical Advance Directive? Yes  Type of Advance Directive (No Data)  Does patient want to make changes to medical advance directive? No - Patient declined    Chief complaint-acute visit follow-up pneumonia also patient wanting change in Neurontin dosing +  HPI:  Pt is a 81 y.o. male seen today for an acute visit for follow-up of pneumonia Patient was seen yesterday for low-grade fever as well as hypotension and left elbow swallowing. He was found to have a right base infiltrate-.  Patient has a complex medical history including history of colitis with chronic diarrhea-coronary artery disease-hypertension-GERD-gout-chronic elevated liver function tests-hyperlipidemia as well as rheumatoid arthritis and significant chronic peripheral neuropathy-positive cancer status post prostatectomy as well as basal cell cancer.  Patient initially was hospitalized for weakness and diarrhea he was [facility] in late August early September and had a PEG tube placed.  He also was found to have a noninflammatory neuromyopathy secondary to issues toxic-metabolic issues.  He stayed in rehabilitation over at [facility] for 2 weeks and discharged here for further rehabilitation   Subsequently he developed fever and was transferred back to [facility] on September  30-chest x-ray blood and urine cultures were negative he was treated empirically with Aztreonam and vancomycin-he was also started on steroids and he improved.  He had delirium but this was thought due to medicines.  Subsequently he was sent back to skilled nursing here but developed a low-grade fever with cough and weakness-his blood pressure sitting in the chair was 860 yesterday but did rise to systolic in the 818E when he lied down.  He also complained of pain in his left elbow and right third toe   . Chest x-ray has come back showing a right base infiltrate -he has numerous antibiotic allergies but was started on Bactrim and Zithromax by Dr. Lyndel Safe yesterday.  He appears to be doing better in this regard says his breathing is better he is now sitting in his wheelchair appears to be comfortable he is afebrile.  Regards to hypotension metoprolol was discontinued blood pressure today is 107/68.  He also says his left lbow discomfort is somewhat better he does have a history of gout he is already on prednisone and this will be continued for another week at 40 mg before taper down he is also on aAllopurinol  Patient's main complaint today is he would like his Neurontin dose changed he says he has more symptoms with leg pain and shaking later in the day around 5 or 6 in the afternoon-currently he receives Neurontin 300 mg at 10 AM-2 PM-and 10 PM-he would like his doses moved more to the afternoon and evening saying that's when he is most symptomatic.       Past Medical History:  Diagnosis Date  . Collagenous colitis   . Coronary atherosclerosis of native coronary artery    DES RCA  5/11, LVEF 55%  . Essential hypertension, benign   . GERD (gastroesophageal reflux disease)   . Hiatal hernia   . Mixed hyperlipidemia   . Obstructive sleep apnea   . Prostate cancer ([place])   . Pulmonary nodules   . Rheumatoid arthritis [facility])    Past Surgical History:  Procedure Laterality Date  .  APPENDECTOMY    . COLONOSCOPY  06/2016   Dr. Britta Mccreedy: mild diverticulosis in left colon, sessile polyp 3-7 mm in distal descending colon, biopsies of colon to assess for microscopic colitis. path: collagenous colitis. colon polyp was benign polypoid colorectal mucosa   . COLONOSCOPY  04/2014   Dr. Britta Mccreedy: office notes stated 3 tubular adenomas and diverticulosis of left colon   . CORONARY ANGIOPLASTY WITH STENT PLACEMENT  2011  . ESOPHAGOGASTRODUODENOSCOPY  06/2016   Dr. Britta Mccreedy: gastritis, path: chronic active gastritis, negative H.pylori   . HEMORRHOID SURGERY    . LAPAROSCOPIC CHOLECYSTECTOMY    . PROSTATECTOMY    . Skin cancer resection      Allergies  Allergen Reactions  . Cefprozil   . Doxycycline Hives and Itching  . Pineapple     Other reaction(s): Bleeding (intolerance) Mouth burns and bleeds  . Zenpep [Pancrelipase (Lip-Prot-Amyl)]     Patient does not recall reaction but states he had to stop because it was "bad". I have listed in allergies, as we are not able to determine what this was.   . Levofloxacin Rash    Unknown REACTION: rash  . Penicillins Rash    REACTION: hives  . Statins Other (See Comments) and Rash    aching aching    Outpatient Encounter Prescriptions as of 10/07/2017  Medication Sig  . acetaminophen (TYLENOL) 100 MG/ML solution Give 15.6 mls by Per G tube route every 6 hours as needed for mild pain or headache  . acetaminophen (TYLENOL) 500 MG tablet Take 500 mg by mouth 2 (two) times daily as needed for mild pain or moderate pain.  Marland Kitchen allopurinol (ZYLOPRIM) 100 MG tablet Place 100 mg into feeding tube 2 (two) times daily.  Marland Kitchen aspirin 81 MG chewable tablet Place 81 mg into feeding tube daily.  Marland Kitchen azithromycin (ZITHROMAX) 500 MG tablet Take 500 mg by mouth daily. Take from 10/06/2017-10/08/2017  . budesonide (PULMICORT) 0.5 MG/2ML nebulizer solution Take 0.5 mg by nebulization 3 (three) times daily.  . calcium-vitamin D (OSCAL WITH D) 250-125 MG-UNIT  tablet Place 1 tablet into feeding tube daily.  . cevimeline (EVOXAC) 30 MG capsule Place 30 mg into feeding tube 3 (three) times daily. May add 1 tab at bedtime as needed.  . Cholecalciferol 1000 units capsule Place 1,000 Units into feeding tube daily.  . clobetasol (TEMOVATE) 0.05 % GEL Apply topically as needed.  . collagenase (SANTYL) ointment Apply 1 application topically daily. Apply to sacral wound  . gabapentin (NEURONTIN) 250 MG/5ML solution Place 300 mg into feeding tube 3 (three) times daily.  . Lactobacillus (LACTINEX) PACK 1 packet; gastric tube. Mix with water  . Magnesium Carbonate (MAGONATE) 54 MG/5ML LIQD Place 54 mg into feeding tube daily.  . nitroGLYCERIN (NITROSTAT) 0.4 MG SL tablet Place 1 tablet (0.4 mg total) under the tongue every 5 (five) minutes as needed.  . NON FORMULARY 150 ml free water by Peg Tube qid. Keep HOB elevated 45 degrees during all flushes and bolus feeds. Four times a day  . Nutritional Supplements (FEEDING SUPPLEMENT, JEVITY 1.5 CAL,) LIQD Place 200 mLs into feeding tube every 4 (four) hours.   Marland Kitchen  pantoprazole sodium (PROTONIX) 40 mg/20 mL PACK Place 40 mg into feeding tube 2 (two) times daily.  . predniSONE (DELTASONE) 10 MG tablet Take 40 mg by mouth daily. Starting 10/06/2017- 10/11/2017  . predniSONE (DELTASONE) 10 MG tablet Take 30 mg by mouth daily with breakfast. Starting 10/12/2017-10/14/2017  . predniSONE (DELTASONE) 10 MG tablet Take 20 mg by mouth daily with breakfast. Starting 10/15/2017-10/17/2017  . predniSONE (DELTASONE) 10 MG tablet Take 10 mg by mouth daily with breakfast. Starting 10/18/2017-10/20/2017  . predniSONE (DELTASONE) 5 MG tablet Take 5 mg by mouth daily with breakfast. Take from 10/21/2017-10/23/2017  . sulfamethoxazole-trimethoprim (BACTRIM DS,SEPTRA DS) 800-160 MG tablet Take 1 tablet by mouth 2 (two) times daily.  . vitamin B-12 (CYANOCOBALAMIN) 1000 MCG tablet Place 1,000 mcg into feeding tube daily.  . [DISCONTINUED]  metoprolol tartrate (LOPRESSOR) 25 MG tablet TAKE ONE-HALF (1/2) TABLET TWICE A DAY   No facility-administered encounter medications on file as of 10/07/2017.     Review of Systems  In general is not complaining of any fever or chills says he feels better today.  Skin does not complain of rashes or itching or diaphoresis.  Head ears eyes nose mouth and throat does not complaining of visual changes or nasal discharge or sore throat.  Respiratory does not really complain of shortness of breath has had a cough again is being treated for pneumonia.  Cardiac does not complain of chest pain does have some chronic pedal edema.  GI is not complaining of abdominal discomfort does have a PEG tube does not complain of nausea vomiting diarrhea or constipation at this time.  GU does not complaining of dysuria.  Muscle skeletal does complain of some left elbow discomfort but says it's a bit better today.  Neurologic does not complain of dizziness or headache does have neuropathic discomfort he says more so in the late afternoon does not complain of dizziness or syncope.  Psych does not complain of overt anxiety or depression nursing staff has not reported any issues   Immunization History  Administered Date(s) Administered  . Influenza-Unspecified 08/29/2014   Pertinent  Health Maintenance Due  Topic Date Due  . INFLUENZA VACCINE  11/28/2017 (Originally 07/29/2017)  . PNA vac Low Risk Adult (1 of 2 - PCV13) 11/28/2017 (Originally 11/01/2003)   Fall Risk  08/26/2016  Falls in the past year? Yes  Comment Emmi Telephone Survey: data to providers prior to load  Number falls in past yr: 1  Comment Emmi Telephone Survey Actual Response = 1  Injury with Fall? Yes   Functional Status Survey:    Vitals:   10/07/17 1041  BP: 107/68  Pulse: 78  Resp: 20  Temp: 98.6 F (37 C)  TempSrc: Oral    Physical Exam  In general this is a somewhat frail elderly male in no distress sitting  comfortably in his wheelchair.  His skin is warm and dry.  Oropharynx clear mucous membranes moist.  Chest is clear to auscultation with slightly reduced air entry at the bases I could not really appreciate overt congestion.  Heart is regular rate and rhythm with occasional irregular beats without murmur gallop or rub he has pedal edema pedal pulses are intact bilaterally.  Abdomen is somewhat protuberant soft nontender with positive bowel sounds excited appears remarkable without drainage or bleeding.  Musculoskeletal he does move extremities 4 ambulates in a wheelchair he does have a slightly tender nodule on his left elbow that is somewhat warm to touch he says this is  improved somewhat from yesterday.  Neurologic is grossly intact to speech is clear no lateralizing findings.  Psych he is alert and oriented pleasant and appropriate    Labs reviewed:  Recent Labs  09/26/17 1425 09/27/17 1125 10/07/17 0704  NA 133* 133* 138  K 3.8 4.1 4.0  CL 101 102 104  CO2 26 23 27   GLUCOSE 193* 207* 111*  BUN 15 15 20   CREATININE 0.65 0.59* 0.62  CALCIUM 7.8* 7.8* 7.7*    Recent Labs  09/26/17 1425 09/27/17 1125 10/07/17 0704  AST 39 31 29  ALT 38 29 44  ALKPHOS 102 105 92  BILITOT 1.2 1.1 0.7  PROT 5.6* 5.3* 4.9*  ALBUMIN 2.1* 1.9* 1.9*    Recent Labs  09/22/17 0403  09/26/17 1425 09/27/17 1125 10/07/17 0704  WBC 5.2  < > 7.5 6.9 7.7  NEUTROABS 2.8  --  3.7 4.0  --   HGB 10.8*  < > 11.0* 10.4* 11.0*  HCT 34.3*  < > 34.7* 32.1* 35.3*  MCV 103.3*  < > 101.8* 101.3* 102.6*  PLT 93*  < > 85* 84* 111*  < > = values in this interval not displayed. Lab Results  Component Value Date   TSH 0.480 08/15/2017   No results found for: HGBA1C Lab Results  Component Value Date   CHOL  05/08/2010    161        ATP III CLASSIFICATION:  <200     mg/dL   Desirable  200-239  mg/dL   Borderline High  >=240    mg/dL   High          HDL 27 (L) 05/08/2010   LDLCALC   05/08/2010    84        Total Cholesterol/HDL:CHD Risk Coronary Heart Disease Risk Table                     Men   Women  1/2 Average Risk   3.4   3.3  Average Risk       5.0   4.4  2 X Average Risk   9.6   7.1  3 X Average Risk  23.4   11.0        Use the calculated Patient Ratio above and the CHD Risk Table to determine the patient's CHD Risk.        ATP III CLASSIFICATION (LDL):  <100     mg/dL   Optimal  100-129  mg/dL   Near or Above                    Optimal  130-159  mg/dL   Borderline  160-189  mg/dL   High  >190     mg/dL   Very High   TRIG 248 (H) 05/08/2010   CHOLHDL 6.0 05/08/2010    Significant Diagnostic Results in last 30 days:  Dg Chest 2 View  Result Date: 10/06/2017 CLINICAL DATA:  History of aspiration. Prostate cancer. Hypertension. Prior history of smoking. EXAM: CHEST  2 VIEW COMPARISON:  09/27/2017.  CT 08/15/2017 . FINDINGS: Mediastinum and hilar structures normal. Changes of bullous COPD. Mild right base infiltrate and right-sided pleural effusion. Small left pleural effusion. No pneumothorax. Diffuse osteopenia degenerative change. Multiple stable compression fractures noted. IMPRESSION: 1.  Bullous COPD. 2. Mild right base infiltrate with small right pleural effusion. Small left pleural effusion. Electronically Signed   By: Marcello Moores  Register   On: 10/06/2017 14:59  Dg Chest 2 View  Result Date: 09/22/2017 CLINICAL DATA:  Shortness of breath. Low oxygen saturation. Leg swelling. EXAM: CHEST  2 VIEW COMPARISON:  Chest CT 08/15/2017, included portion from abdominal CT performed concurrently. FINDINGS: Low lung volumes. Bibasilar atelectasis and trace pleural effusions, pleural fluid better seen on concurrent CT. Normal heart size and mediastinal contours. No pulmonary edema. No pneumothorax. No confluent airspace disease. Wedge compression fracture of lower thoracic vertebra is chronic and unchanged. IMPRESSION: Low lung volumes with bibasilar atelectasis and  trace pleural effusions. Electronically Signed   By: Jeb Levering M.D.   On: 09/22/2017 05:03   Dg Elbow Complete Left (3+view)  Result Date: 10/06/2017 CLINICAL DATA:  The patient suffered a blow to the left elbow today while walking with onset of pain. Initial encounter. EXAM: LEFT ELBOW - COMPLETE 3+ VIEW COMPARISON:  None. FINDINGS: No fracture or dislocation is seen. Elbow joint effusion is noted. Marked soft tissue swelling posterior to the olecranon is identified. IMPRESSION: Negative for fracture. Marked soft tissue swelling posterior to the olecranon consistent with bursitis. Small elbow joint effusion is also identified. These findings are nonspecific but can be seen in gout. Electronically Signed   By: Inge Rise M.D.   On: 10/06/2017 14:58   Ct Abdomen Pelvis W Contrast  Result Date: 09/22/2017 CLINICAL DATA:  Abdominal distension EXAM: CT ABDOMEN AND PELVIS WITH CONTRAST TECHNIQUE: Multidetector CT imaging of the abdomen and pelvis was performed using the standard protocol following bolus administration of intravenous contrast. CONTRAST:  129mL ISOVUE-300 IOPAMIDOL (ISOVUE-300) INJECTION 61% COMPARISON:  CT abdomen pelvis 08/15/2017 FINDINGS: Lower chest: There is bibasilar atelectasis. Trace left pleural effusion. Previously described nodule of the right lung base is not clearly visualized on this study, possibly due to adjacent atelectasis. Hepatobiliary: The liver is small with a mildly nodular contour. There is moderate perihepatic ascites. Status post cholecystectomy. Pancreas: Normal contours without ductal dilatation. No peripancreatic fluid collection. Spleen: Unchanged low-attenuation focus in the spleen. Moderate perisplenic fluid. Adrenals/Urinary Tract: --Adrenal glands: Normal. --Right kidney/ureter: No hydronephrosis or perinephric stranding. No nephrolithiasis. No obstructing ureteral stones. --Left kidney/ureter: No hydronephrosis or perinephric stranding. No  nephrolithiasis. No obstructing ureteral stones. --Urinary bladder: Unremarkable. Stomach/Bowel: --Stomach/Duodenum: There is a gastrostomy tube within the stomach. Stomach is decompressed. Normal course and caliber of the duodenum. --Small bowel: No dilatation or inflammation. --Colon: There are numerous diverticula of the sigmoid colon. The stool throughout the rectum and sigmoid colon is extremely dense and has the appearance of contrast material. However, there is no proximal enteric contrast. I suspect this contrast material is left over from the prior study. --Appendix: Surgically absent. Vascular/Lymphatic: Atherosclerotic calcification is present within the non-aneurysmal abdominal aorta, without hemodynamically significant stenosis. No abdominal or pelvic lymphadenopathy. Reproductive: No free fluid in the pelvis. Musculoskeletal. Multilevel degenerative disc disease and facet arthrosis. No bony spinal canal stenosis. Other: Large amount of fluid within the abdomen and pelvis. Fluid tracks from the perihepatic and perisplenic locations along the paracolic gutters into the lower abdomen. IMPRESSION: 1. Large amount of intraperitoneal free fluid, of uncertain origin. Correlation with albumin levels may be helpful. 2. Hyperdense stool within the distal colon, likely indicating retained contrast material. The most recent scan with enteric contrast that I have record of for this patient was performed on 08/15/17. This suggests reduced colonic motility. 3.  Aortic Atherosclerosis (ICD10-I70.0). 4. Trace left pleural effusion. 5. Previously described right lung base pulmonary nodule is not clearly seen on this study, possibly due to adjacent  atelectasis. Electronically Signed   By: Ulyses Jarred M.D.   On: 09/22/2017 05:16   Dg Chest Port 1 View  Result Date: 09/27/2017 CLINICAL DATA:  Sepsis. EXAM: PORTABLE CHEST 1 VIEW COMPARISON:  09/22/2017 FINDINGS: The cardiomediastinal silhouette is unchanged and within  normal limits. The lungs remain hypoinflated, and there is slight elevation of the right hemidiaphragm. No lobar consolidation, edema, sizable pleural effusion, or pneumothorax is identified. IMPRESSION: Hypoinflation.  No evidence of pneumonia. Electronically Signed   By: Logan Bores M.D.   On: 09/27/2017 12:09    Assessment/Plan  1 history of right base infiltrate he is been started on Bactrim as well as Zithromax he appears to be doing well today he is not complaining of fever chills or increased shortness of breath.  Labs appear to be stable his white count is within normal range at 7.7 today-at this point will monitor continue Bactrim until October 16 and Zithromax until October 11.  #2 hypertension appears improved metoprolol was discontinued yesterday pulse taken today was around 90 at this point will monitor.  Per discussion with Dr. Lyndel Safe she would like to decrease his Neurontin slightly secondary to hypotension concerns I did discuss this with patient as well-.  #3 history of peripheral neuropathy currently on  Neurontin 300 mg 3 times a day at 10 AM-2 PM-10 PM-patient would like his Neurontin doses adjusted to more p.m. dosing since that is  when he is symptomatic.--I did discuss this with Dr. Lyndel Safe via phone-there is also concern with the Neurontin causing hypotension-per discussion with Dr. Lyndel Safe will decrease Neurontin down to 200 mg 3 times a day and will give this in the afternoon and evening hours secondary to patient request will give a dose at 3P-6P-and 10P--and monitor-patient is in agreement with this as long as possible adjustments made if not effective  #4-history of elbow swelling x-ray was done which did show gout he is on prednisone again we'll continue him at the higher dose 40 mg for an additional week per Dr. Lyndel Safe this appears to have improved somewhat today.  [id number]

## 2017-10-09 ENCOUNTER — Encounter: Payer: Self-pay | Admitting: Internal Medicine

## 2017-10-09 ENCOUNTER — Other Ambulatory Visit (HOSPITAL_COMMUNITY): Payer: Self-pay | Admitting: Specialist

## 2017-10-09 ENCOUNTER — Non-Acute Institutional Stay (SKILLED_NURSING_FACILITY): Payer: Medicare Other | Admitting: Internal Medicine

## 2017-10-09 DIAGNOSIS — M109 Gout, unspecified: Secondary | ICD-10-CM

## 2017-10-09 DIAGNOSIS — R609 Edema, unspecified: Secondary | ICD-10-CM

## 2017-10-09 DIAGNOSIS — J189 Pneumonia, unspecified organism: Secondary | ICD-10-CM | POA: Diagnosis not present

## 2017-10-09 DIAGNOSIS — R1319 Other dysphagia: Secondary | ICD-10-CM

## 2017-10-09 NOTE — Progress Notes (Signed)
Location:   [facility] Room Number: 159/P Place of Service:  SNF [phone]) Provider:  Halford Chessman, MD  Patient Care Team: [name], MD as PCP - General (Family Medicine) [name], MD as Consulting Physician (Gastroenterology)  Extended Emergency Contact Information Primary Emergency Contact: Luan Pulling Address: [address]          [place], [place] [address] [name] of [place] Phone: [phone] Mobile Phone: [phone] Relation: Spouse  Code Status:  Full Code Goals of care: Advanced Directive information Advanced Directives 10/09/2017  Does Patient Have a Medical Advance Directive? Yes  Type of Advance Directive (No Data)  Does patient want to make changes to medical advance directive? No - Patient declined     Chief Complaint  Patient presents with  . Acute Visit    Leg Edema  ( Both)  Also follow-up pneumonia.    HPI:  Pt is a 81 y.o. male seen today for an acute visit for  increased lower extremity edema-.  Patient has contacted medical history does have a history colitis with chronic diarrhea-hypertension-coronary artery disease-GERD-chronic elevated liver function tests-doubt-as well as hyperlipidemia rheumatoid arthritis and peripheral neuropathy.  He is also status post prostatectomy with a history of prostate cancer.  He was hospitalized for weakness and diarrhea [facility] and had a PEG tube placed.  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  He was over at rehabilitation at [facility] and discharged here for further rehabilitation.  After getting here he developed a fever and was transferred back to [facility] chest x-ray blood urine cultures were negative he was treated empirically for pneumonia and also started on steroids any improved.  He has come back to skilled nursing but did develop a low-grade fever with cough and weakness-chest x-ray did come back  showing a right base infiltrate he had numerous and body gallop G spell was started on Bactrim and a short course of Zithromax-she's completed the Zithromax continues on Bactrim he appears to be doing well in this regard he appears to be stronger and says breathing apparently has improved  He does have a history of ascites with swelling of his abdomen and legs at one point had been on Aldactone.  In fact he went to the ED recently -- CT scan of the abdomen showed ascites and a small left pleural effusion.  It is thought he may have possible mild cirrhosis he also has a low albumin which has contributed this I suspect. Most recent albumin was 1.9  After ER visit he was started on Aldactone but again he did go back to [facility] and appears Aldactone was discontinued at some point.  Appears his weight has been stable here relatively during the past week although appears it's up about 7 pounds since his low which was in late September when he was 147 pounds weight yesterday was 155 but this is stable with weight earlier this week.  He is not complaining of any increased shortness of breath but nursing staff and patient have noticed some increased edema of his legs.  He also has a history per echo of mild aortic stenosis with grade 1 diastolic dysfunction a BNP in late September was fairly unremarkable at 142    .      Past Medical History:  Diagnosis Date  . Collagenous colitis   . Coronary atherosclerosis of native coronary artery    DES RCA 5/11, LVEF 55%  .  Essential hypertension, benign   . GERD (gastroesophageal reflux disease)   . Hiatal hernia   . Mixed hyperlipidemia   . Obstructive sleep apnea   . Prostate cancer ([place])   . Pulmonary nodules   . Rheumatoid arthritis [facility])    Past Surgical History:  Procedure Laterality Date  . APPENDECTOMY    . COLONOSCOPY  06/2016   Dr. Britta Mccreedy: mild diverticulosis in left colon, sessile polyp 3-7 mm in distal descending colon, biopsies of  colon to assess for microscopic colitis. path: collagenous colitis. colon polyp was benign polypoid colorectal mucosa   . COLONOSCOPY  04/2014   Dr. Britta Mccreedy: office notes stated 3 tubular adenomas and diverticulosis of left colon   . CORONARY ANGIOPLASTY WITH STENT PLACEMENT  2011  . ESOPHAGOGASTRODUODENOSCOPY  06/2016   Dr. Britta Mccreedy: gastritis, path: chronic active gastritis, negative H.pylori   . HEMORRHOID SURGERY    . LAPAROSCOPIC CHOLECYSTECTOMY    . PROSTATECTOMY    . Skin cancer resection      Allergies  Allergen Reactions  . Cefprozil   . Doxycycline Hives and Itching  . Pineapple     Other reaction(s): Bleeding (intolerance) Mouth burns and bleeds  . Zenpep [Pancrelipase (Lip-Prot-Amyl)]     Patient does not recall reaction but states he had to stop because it was "bad". I have listed in allergies, as we are not able to determine what this was.   . Levofloxacin Rash    Unknown REACTION: rash  . Penicillins Rash    REACTION: hives  . Statins Other (See Comments) and Rash    aching aching    Outpatient Encounter Prescriptions as of 10/09/2017  Medication Sig  . acetaminophen (TYLENOL) 100 MG/ML solution Give 15.6 mls by Per G tube route every 6 hours as needed for mild pain or headache  . acetaminophen (TYLENOL) 500 MG tablet Take 500 mg by mouth as needed for mild pain or moderate pain.   Marland Kitchen allopurinol (ZYLOPRIM) 100 MG tablet Place 100 mg into feeding tube 2 (two) times daily.  Marland Kitchen aspirin 81 MG chewable tablet Place 81 mg into feeding tube daily.  . budesonide (PULMICORT) 0.5 MG/2ML nebulizer solution Take 0.5 mg by nebulization 3 (three) times daily.  . calcium-vitamin D (OSCAL WITH D) 250-125 MG-UNIT tablet Place 1 tablet into feeding tube daily.  . cevimeline (EVOXAC) 30 MG capsule Place 30 mg into feeding tube 3 (three) times daily. May add 1 tab at bedtime as needed.  . Cholecalciferol 1000 units capsule Place 1,000 Units into feeding tube daily.  . clobetasol  (TEMOVATE) 0.05 % GEL Apply topically as needed.  . collagenase (SANTYL) ointment Apply 1 application topically daily. Apply to sacral wound  . gabapentin (NEURONTIN) 250 MG/5ML solution Place 300 mg into feeding tube 3 (three) times daily.  . Lactobacillus (LACTINEX) PACK 1 packet; gastric tube. Mix with water  . Magnesium Carbonate (MAGONATE) 54 MG/5ML LIQD Place 54 mg into feeding tube daily.  . nitroGLYCERIN (NITROSTAT) 0.4 MG SL tablet Place 1 tablet (0.4 mg total) under the tongue every 5 (five) minutes as needed.  . NON FORMULARY 150 ml free water by Peg Tube qid. Keep HOB elevated 45 degrees during all flushes and bolus feeds. Four times a day  . Nutritional Supplements (FEEDING SUPPLEMENT, JEVITY 1.5 CAL,) LIQD Place 200 mLs into feeding tube every 4 (four) hours.   . pantoprazole sodium (PROTONIX) 40 mg/20 mL PACK Place 40 mg into feeding tube 2 (two) times daily.  . predniSONE (DELTASONE)  10 MG tablet Take 40 mg by mouth daily. Starting 10/06/2017- 10/11/2017  . predniSONE (DELTASONE) 10 MG tablet Take 30 mg by mouth daily with breakfast. Starting 10/12/2017-10/14/2017  . predniSONE (DELTASONE) 10 MG tablet Take 20 mg by mouth daily with breakfast. Starting 10/15/2017-10/17/2017  . predniSONE (DELTASONE) 10 MG tablet Take 10 mg by mouth daily with breakfast. Starting 10/18/2017-10/20/2017  . predniSONE (DELTASONE) 5 MG tablet Take 5 mg by mouth daily with breakfast. Take from 10/21/2017-10/23/2017  . sulfamethoxazole-trimethoprim (BACTRIM DS,SEPTRA DS) 800-160 MG tablet Take 1 tablet by mouth 2 (two) times daily.  . vitamin B-12 (CYANOCOBALAMIN) 1000 MCG tablet Place 1,000 mcg into feeding tube daily.  . [DISCONTINUED] azithromycin (ZITHROMAX) 500 MG tablet Take 500 mg by mouth daily. Take from 10/06/2017-10/08/2017   No facility-administered encounter medications on file as of 10/09/2017.     Review of Systems   In general is not complaining of any fever or chills appears to be  feeling stronger.  Skin does not complain of diaphoresis rashes or itching.  Head ears eyes nose mouth and throat does not complaining sore throat or visual changes.  Respiratory says his breathing is stable does not of complain of increased shortness of breath is completing g treatment for pneumonia does not complain of increased cough  Cardiac does not complain of chest pain does have increased lower extremity edema.  GI does not complain of abdominal discomfort nausea vomiting diarrhea or constipation.  GU does not complain of dysuria.  Muscle skeletal continues complain at times of some left elbow pain with history of gout-does not complain of generalized joint pain however.  Neurologic does not complain of dizziness headache or syncope.  Psych appears to be in good spirits does not complain of depression or anxiety     Immunization History  Administered Date(s) Administered  . Influenza-Unspecified 08/29/2014   Pertinent  Health Maintenance Due  Topic Date Due  . INFLUENZA VACCINE  11/28/2017 (Originally 07/29/2017)  . PNA vac Low Risk Adult (1 of 2 - PCV13) 11/28/2017 (Originally 11/01/2003)   Fall Risk  08/26/2016  Falls in the past year? Yes  Comment Emmi Telephone Survey: data to providers prior to load  Number falls in past yr: 1  Comment Emmi Telephone Survey Actual Response = 1  Injury with Fall? Yes   Functional Status Survey:    He is afebrile pulse is 90 respirations 18 blood pressure taken manually 130/78 weight is 155.8  Physical Exam   In general this is a somewhat frail elderly male in no distress sitting comfortably in his wheelchair.  His skin is warm and dry.  Eyes sclera and conjunctivae are clear visual acuity appears grossly intact.  Oropharynx is clear mucous membranes moist.  Chest is clear to auscultation there is no labored breathing has somewhat shallow air entry at the bases.  Heart is regular rate and rhythm without murmur gallop or  rub he has 2+ edema bilaterally this is cool to touch nonerythematous and nontender this is increased from previous exams.  Abdomen continues to be protuberant but at baseline is soft nontender with active bowel sounds.  Muscle skeletal ambulates in a wheelchair moves all extremities at baseline-continues to have a nodule on his left elbow that is somewhat tender to palpation this appears relatively unchanged.  Neurologic is grossly intact speech is clear I do not appreciate lateralizing findings.  Psych he is alert and oriented continues to be pleasant and appropriate  Labs reviewed:  Recent Labs  09/26/17  1425 09/27/17 1125 10/07/17 0704  NA 133* 133* 138  K 3.8 4.1 4.0  CL 101 102 104  CO2 26 23 27   GLUCOSE 193* 207* 111*  BUN 15 15 20   CREATININE 0.65 0.59* 0.62  CALCIUM 7.8* 7.8* 7.7*    Recent Labs  09/26/17 1425 09/27/17 1125 10/07/17 0704  AST 39 31 29  ALT 38 29 44  ALKPHOS 102 105 92  BILITOT 1.2 1.1 0.7  PROT 5.6* 5.3* 4.9*  ALBUMIN 2.1* 1.9* 1.9*    Recent Labs  09/22/17 0403  09/26/17 1425 09/27/17 1125 10/07/17 0704  WBC 5.2  < > 7.5 6.9 7.7  NEUTROABS 2.8  --  3.7 4.0  --   HGB 10.8*  < > 11.0* 10.4* 11.0*  HCT 34.3*  < > 34.7* 32.1* 35.3*  MCV 103.3*  < > 101.8* 101.3* 102.6*  PLT 93*  < > 85* 84* 111*  < > = values in this interval not displayed. Lab Results  Component Value Date   TSH 0.480 08/15/2017   No results found for: HGBA1C Lab Results  Component Value Date   CHOL  05/08/2010    161        ATP III CLASSIFICATION:  <200     mg/dL   Desirable  200-239  mg/dL   Borderline High  >=240    mg/dL   High          HDL 27 (L) 05/08/2010   LDLCALC  05/08/2010    84        Total Cholesterol/HDL:CHD Risk Coronary Heart Disease Risk Table                     Men   Women  1/2 Average Risk   3.4   3.3  Average Risk       5.0   4.4  2 X Average Risk   9.6   7.1  3 X Average Risk  23.4   11.0        Use the calculated Patient  Ratio above and the CHD Risk Table to determine the patient's CHD Risk.        ATP III CLASSIFICATION (LDL):  <100     mg/dL   Optimal  100-129  mg/dL   Near or Above                    Optimal  130-159  mg/dL   Borderline  160-189  mg/dL   High  >190     mg/dL   Very High   TRIG 248 (H) 05/08/2010   CHOLHDL 6.0 05/08/2010    Significant Diagnostic Results in last 30 days:  Dg Chest 2 View  Result Date: 10/06/2017 CLINICAL DATA:  History of aspiration. Prostate cancer. Hypertension. Prior history of smoking. EXAM: CHEST  2 VIEW COMPARISON:  09/27/2017.  CT 08/15/2017 . FINDINGS: Mediastinum and hilar structures normal. Changes of bullous COPD. Mild right base infiltrate and right-sided pleural effusion. Small left pleural effusion. No pneumothorax. Diffuse osteopenia degenerative change. Multiple stable compression fractures noted. IMPRESSION: 1.  Bullous COPD. 2. Mild right base infiltrate with small right pleural effusion. Small left pleural effusion. Electronically Signed   By: Marcello Moores  Register   On: 10/06/2017 14:59   Dg Chest 2 View  Result Date: 09/22/2017 CLINICAL DATA:  Shortness of breath. Low oxygen saturation. Leg swelling. EXAM: CHEST  2 VIEW COMPARISON:  Chest CT 08/15/2017, included portion from abdominal CT performed  concurrently. FINDINGS: Low lung volumes. Bibasilar atelectasis and trace pleural effusions, pleural fluid better seen on concurrent CT. Normal heart size and mediastinal contours. No pulmonary edema. No pneumothorax. No confluent airspace disease. Wedge compression fracture of lower thoracic vertebra is chronic and unchanged. IMPRESSION: Low lung volumes with bibasilar atelectasis and trace pleural effusions. Electronically Signed   By: Jeb Levering M.D.   On: 09/22/2017 05:03   Dg Elbow Complete Left (3+view)  Result Date: 10/06/2017 CLINICAL DATA:  The patient suffered a blow to the left elbow today while walking with onset of pain. Initial encounter.  EXAM: LEFT ELBOW - COMPLETE 3+ VIEW COMPARISON:  None. FINDINGS: No fracture or dislocation is seen. Elbow joint effusion is noted. Marked soft tissue swelling posterior to the olecranon is identified. IMPRESSION: Negative for fracture. Marked soft tissue swelling posterior to the olecranon consistent with bursitis. Small elbow joint effusion is also identified. These findings are nonspecific but can be seen in gout. Electronically Signed   By: Inge Rise M.D.   On: 10/06/2017 14:58   Ct Abdomen Pelvis W Contrast  Result Date: 09/22/2017 CLINICAL DATA:  Abdominal distension EXAM: CT ABDOMEN AND PELVIS WITH CONTRAST TECHNIQUE: Multidetector CT imaging of the abdomen and pelvis was performed using the standard protocol following bolus administration of intravenous contrast. CONTRAST:  123mL ISOVUE-300 IOPAMIDOL (ISOVUE-300) INJECTION 61% COMPARISON:  CT abdomen pelvis 08/15/2017 FINDINGS: Lower chest: There is bibasilar atelectasis. Trace left pleural effusion. Previously described nodule of the right lung base is not clearly visualized on this study, possibly due to adjacent atelectasis. Hepatobiliary: The liver is small with a mildly nodular contour. There is moderate perihepatic ascites. Status post cholecystectomy. Pancreas: Normal contours without ductal dilatation. No peripancreatic fluid collection. Spleen: Unchanged low-attenuation focus in the spleen. Moderate perisplenic fluid. Adrenals/Urinary Tract: --Adrenal glands: Normal. --Right kidney/ureter: No hydronephrosis or perinephric stranding. No nephrolithiasis. No obstructing ureteral stones. --Left kidney/ureter: No hydronephrosis or perinephric stranding. No nephrolithiasis. No obstructing ureteral stones. --Urinary bladder: Unremarkable. Stomach/Bowel: --Stomach/Duodenum: There is a gastrostomy tube within the stomach. Stomach is decompressed. Normal course and caliber of the duodenum. --Small bowel: No dilatation or inflammation. --Colon:  There are numerous diverticula of the sigmoid colon. The stool throughout the rectum and sigmoid colon is extremely dense and has the appearance of contrast material. However, there is no proximal enteric contrast. I suspect this contrast material is left over from the prior study. --Appendix: Surgically absent. Vascular/Lymphatic: Atherosclerotic calcification is present within the non-aneurysmal abdominal aorta, without hemodynamically significant stenosis. No abdominal or pelvic lymphadenopathy. Reproductive: No free fluid in the pelvis. Musculoskeletal. Multilevel degenerative disc disease and facet arthrosis. No bony spinal canal stenosis. Other: Large amount of fluid within the abdomen and pelvis. Fluid tracks from the perihepatic and perisplenic locations along the paracolic gutters into the lower abdomen. IMPRESSION: 1. Large amount of intraperitoneal free fluid, of uncertain origin. Correlation with albumin levels may be helpful. 2. Hyperdense stool within the distal colon, likely indicating retained contrast material. The most recent scan with enteric contrast that I have record of for this patient was performed on 08/15/17. This suggests reduced colonic motility. 3.  Aortic Atherosclerosis (ICD10-I70.0). 4. Trace left pleural effusion. 5. Previously described right lung base pulmonary nodule is not clearly seen on this study, possibly due to adjacent atelectasis. Electronically Signed   By: Ulyses Jarred M.D.   On: 09/22/2017 05:16   Dg Chest Port 1 View  Result Date: 09/27/2017 CLINICAL DATA:  Sepsis. EXAM: PORTABLE CHEST 1 VIEW  COMPARISON:  09/22/2017 FINDINGS: The cardiomediastinal silhouette is unchanged and within normal limits. The lungs remain hypoinflated, and there is slight elevation of the right hemidiaphragm. No lobar consolidation, edema, sizable pleural effusion, or pneumothorax is identified. IMPRESSION: Hypoinflation.  No evidence of pneumonia. Electronically Signed   By: Logan Bores  M.D.   On: 09/27/2017 12:09    Assessment/Plan  #1-increased edema --will restart Aldactone 25 mg daily this was discussed with Dr. Lyndel Safe via phone-will update a CMP on Monday, October 15-to keep an eye on his renal function and electrolytes which have been stable.  Continue to monitor weights every day notify provider of gain greater than 3 pounds.  #2 history of pneumonia again he appears to be doing well he is completing course of Bactrim has already completed the Zithromax respiratory status appears to be stable.  #3 gout he continues on a increased dose of prednisone secondary to suspected gout flare eventually this will be tapered down at this point will monitor he continues on Allopurinal twice a day as well    [id number]

## 2017-10-12 ENCOUNTER — Other Ambulatory Visit (HOSPITAL_COMMUNITY)
Admission: RE | Admit: 2017-10-12 | Discharge: 2017-10-12 | Disposition: A | Discharge status: Home / Self Care | Payer: Medicare Other | Source: Skilled Nursing Facility | Attending: Internal Medicine | Admitting: Internal Medicine

## 2017-10-12 DIAGNOSIS — K222 Esophageal obstruction: Secondary | ICD-10-CM | POA: Diagnosis not present

## 2017-10-12 DIAGNOSIS — R5381 Other malaise: Secondary | ICD-10-CM | POA: Diagnosis not present

## 2017-10-12 DIAGNOSIS — R1314 Dysphagia, pharyngoesophageal phase: Secondary | ICD-10-CM | POA: Diagnosis not present

## 2017-10-12 DIAGNOSIS — R1313 Dysphagia, pharyngeal phase: Secondary | ICD-10-CM | POA: Diagnosis not present

## 2017-10-12 DIAGNOSIS — A419 Sepsis, unspecified organism: Secondary | ICD-10-CM | POA: Insufficient documentation

## 2017-10-12 DIAGNOSIS — J383 Other diseases of vocal cords: Secondary | ICD-10-CM | POA: Diagnosis not present

## 2017-10-12 DIAGNOSIS — R490 Dysphonia: Secondary | ICD-10-CM | POA: Diagnosis not present

## 2017-10-12 LAB — CBC WITH DIFFERENTIAL/PLATELET
BASOS PCT: 0 %
Basophils Absolute: 0 10*3/uL (ref 0.0–0.1)
EOS PCT: 0 %
Eosinophils Absolute: 0 10*3/uL (ref 0.0–0.7)
HCT: 39.8 % (ref 39.0–52.0)
Hemoglobin: 12.5 g/dL — ABNORMAL LOW (ref 13.0–17.0)
Lymphocytes Relative: 29 %
Lymphs Abs: 2.4 10*3/uL (ref 0.7–4.0)
MCH: 32.2 pg (ref 26.0–34.0)
MCHC: 31.4 g/dL (ref 30.0–36.0)
MCV: 102.6 fL — AB (ref 78.0–100.0)
MONO ABS: 0.5 10*3/uL (ref 0.1–1.0)
Monocytes Relative: 6 %
NEUTROS PCT: 65 %
Neutro Abs: 5.4 10*3/uL (ref 1.7–7.7)
PLATELETS: 130 10*3/uL — AB (ref 150–400)
RBC: 3.88 MIL/uL — ABNORMAL LOW (ref 4.22–5.81)
RDW: 17.7 % — AB (ref 11.5–15.5)
WBC: 8.3 10*3/uL (ref 4.0–10.5)

## 2017-10-12 LAB — COMPREHENSIVE METABOLIC PANEL
ALT: 53 U/L (ref 17–63)
AST: 38 U/L (ref 15–41)
Albumin: 2.6 g/dL — ABNORMAL LOW (ref 3.5–5.0)
Alkaline Phosphatase: 115 U/L (ref 38–126)
Anion gap: 8 (ref 5–15)
BUN: 22 mg/dL — AB (ref 6–20)
CO2: 26 mmol/L (ref 22–32)
CREATININE: 0.65 mg/dL (ref 0.61–1.24)
Calcium: 8.3 mg/dL — ABNORMAL LOW (ref 8.9–10.3)
Chloride: 102 mmol/L (ref 101–111)
Glucose, Bld: 91 mg/dL (ref 65–99)
POTASSIUM: 4.2 mmol/L (ref 3.5–5.1)
Sodium: 136 mmol/L (ref 135–145)
Total Bilirubin: 0.4 mg/dL (ref 0.3–1.2)
Total Protein: 6.3 g/dL — ABNORMAL LOW (ref 6.5–8.1)

## 2017-10-12 LAB — VITAMIN B12: Vitamin B-12: 1044 pg/mL — ABNORMAL HIGH (ref 180–914)

## 2017-10-13 ENCOUNTER — Encounter (HOSPITAL_COMMUNITY): Payer: Self-pay | Admitting: Speech Pathology

## 2017-10-13 ENCOUNTER — Ambulatory Visit (HOSPITAL_COMMUNITY)
Admit: 2017-10-13 | Discharge: 2017-10-13 | Disposition: A | Discharge status: Home / Self Care | Payer: Medicare Other | Source: Ambulatory Visit | Attending: Internal Medicine | Admitting: Internal Medicine

## 2017-10-13 ENCOUNTER — Ambulatory Visit (HOSPITAL_COMMUNITY)
Admit: 2017-10-13 | Discharge: 2017-10-13 | Disposition: A | Discharge status: Home / Self Care | Payer: Medicare Other | Attending: Internal Medicine | Admitting: Internal Medicine

## 2017-10-13 ENCOUNTER — Ambulatory Visit (HOSPITAL_COMMUNITY)
Admission: RE | Admit: 2017-10-13 | Discharge: 2017-10-13 | Disposition: A | Discharge status: Home / Self Care | Payer: Medicare Other | Source: Ambulatory Visit | Attending: Internal Medicine | Admitting: Internal Medicine

## 2017-10-13 ENCOUNTER — Ambulatory Visit (HOSPITAL_COMMUNITY): Payer: Medicare Other | Attending: Internal Medicine | Admitting: Speech Pathology

## 2017-10-13 DIAGNOSIS — R269 Unspecified abnormalities of gait and mobility: Secondary | ICD-10-CM

## 2017-10-13 DIAGNOSIS — R1312 Dysphagia, oropharyngeal phase: Secondary | ICD-10-CM | POA: Insufficient documentation

## 2017-10-13 DIAGNOSIS — R51 Headache: Secondary | ICD-10-CM | POA: Diagnosis not present

## 2017-10-13 DIAGNOSIS — M4802 Spinal stenosis, cervical region: Secondary | ICD-10-CM | POA: Diagnosis not present

## 2017-10-13 DIAGNOSIS — G319 Degenerative disease of nervous system, unspecified: Secondary | ICD-10-CM | POA: Insufficient documentation

## 2017-10-13 DIAGNOSIS — M1288 Other specific arthropathies, not elsewhere classified, other specified site: Secondary | ICD-10-CM | POA: Insufficient documentation

## 2017-10-13 DIAGNOSIS — M6281 Muscle weakness (generalized): Secondary | ICD-10-CM | POA: Diagnosis not present

## 2017-10-13 DIAGNOSIS — R1319 Other dysphagia: Secondary | ICD-10-CM | POA: Insufficient documentation

## 2017-10-13 DIAGNOSIS — R1310 Dysphagia, unspecified: Secondary | ICD-10-CM | POA: Diagnosis not present

## 2017-10-13 MED ORDER — GADOBENATE DIMEGLUMINE 529 MG/ML IV SOLN
15.0000 mL | Freq: Once | INTRAVENOUS | Status: AC | PRN
  Administered 2017-10-13: 15 mL via INTRAVENOUS

## 2017-10-13 NOTE — Therapy (Signed)
[place], [place], [address] Phone: [phone]   Fax:  [phone]  Modified Barium Swallow  Patient Details  Name: Kevin Mathis MRN: [id number] Date of Birth: [birth year] No Data Recorded  Encounter Date: 10/13/2017      End of Session - 10/13/17 1811    Visit Number 1   Number of Visits 1   Authorization Type Medicare   SLP Start Time 1430   SLP Stop Time  1510   SLP Time Calculation (min) 40 min   Activity Tolerance Patient tolerated treatment well      Past Medical History:  Diagnosis Date  . Collagenous colitis   . Coronary atherosclerosis of native coronary artery    DES RCA 5/11, LVEF 55%  . Essential hypertension, benign   . GERD (gastroesophageal reflux disease)   . Hiatal hernia   . Mixed hyperlipidemia   . Obstructive sleep apnea   . Prostate cancer ([place])   . Pulmonary nodules   . Rheumatoid arthritis [facility])     Past Surgical History:  Procedure Laterality Date  . APPENDECTOMY    . COLONOSCOPY  06/2016   Dr. Britta Mccreedy: mild diverticulosis in left colon, sessile polyp 3-7 mm in distal descending colon, biopsies of colon to assess for microscopic colitis. path: collagenous colitis. colon polyp was benign polypoid colorectal mucosa   . COLONOSCOPY  04/2014   Dr. Britta Mccreedy: office notes stated 3 tubular adenomas and diverticulosis of left colon   . CORONARY ANGIOPLASTY WITH STENT PLACEMENT  2011  . ESOPHAGOGASTRODUODENOSCOPY  06/2016   Dr. Britta Mccreedy: gastritis, path: chronic active gastritis, negative H.pylori   . HEMORRHOID SURGERY    . LAPAROSCOPIC CHOLECYSTECTOMY    . PROSTATECTOMY    . Skin cancer resection      There were no vitals filed for this visit.      Subjective Assessment - 10/13/17 1745    Subjective "It is getting a little better."   Patient is accompained by: Family member   Special Tests MBSS   Currently in Pain? No/denies           General - 10/13/17 1746       General Information   Date of Onset 08/21/17   HPI Kevin Mathis is a 81 yo male who was referred for MBSS by Dr. Veleta Miners. Pt is currently at [facility] for rehab following a hospitalization at [facility] in [place]. Kevin Mathis history is significant for chronic transaminitis, gout, EtOH abuse, collagenous colitis with chronic diarrhea, CAD s/p stents, HTN, GERD, HH, HLD, OSA on CPAP, RA, connective tissue disease, chronic peripheral neuropathy (etiology unclear), prostate cancer s/p prostatectomy, basal cell cancer s/p skin biopsy, oral lichen planus, and uriticaria who presented to [facility] on 8/24 with weakness and weight loss with associated lactic acidosis. He had a diagnostic workup but it was decided that his weakness was likely due to his chronic and uncontrolled diarrhea. He was also found to have severe dysphagia and required PEG tube placement. 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 Chest x-ray on 10/06/2017 shows: Mild right base infiltrate with small right pleural effusion. Small left pleural effusion. MR Brain: Atrophy with mild chronic white matter changes. No acute intracranial abnormality C1-2 arthropathy with prominent pannus posterior to the dens. No cord compression.MR C-spine shows: C1-2 arthropathy with prominent pannus formation, Mild foraminal narrowing bilaterally at C3-4Mild spinal stenosis and mild to moderate foraminal stenosis  bilaterally at C4-5 Moderate spinal stenosis C5-6 with moderate left foraminal encroachment. Pt was evaluated by Dr. Ernestine Conrad on Monday and he will have transnasal/esophageal endoscopy balloon dilation on 10/29/2017. The results from that visit are currently unavailable on EPIC, however there is a note from ENT, Dr. Redmond Baseman from 08/20/2017 which noted good glottal closure with symmetrical vocal fold adduction and abduction. Pt's spouse reports that  they were told that he had some bowing of his vocal folds after his ENT appointment this past Monday.   Type of Study MBS-Modified Barium Swallow Study   Previous Swallow Assessment 08/2017 at [facility]- PEG with thins   Diet Prior to this Study PEG tube;Thin liquids   Temperature Spikes Noted No   Respiratory Status Room air   History of Recent Intubation No   Behavior/Cognition Alert;Cooperative;Pleasant mood   Oral Cavity Assessment Within Functional Limits   Oral Care Completed by SLP No   Oral Cavity - Dentition Adequate natural dentition   Vision Functional for self feeding   Self-Feeding Abilities Able to feed self   Patient Positioning Upright in chair   Baseline Vocal Quality Normal;Low vocal intensity   Volitional Cough Strong   Volitional Swallow Able to elicit   Anatomy Within functional limits   Pharyngeal Secretions Not observed secondary MBS            Oral Preparation/Oral Phase - 10/13/17 1753      Oral Preparation/Oral Phase   Oral Phase Impaired     Oral - Thin   Oral - Thin Cup Decreased bolus cohesion;Piecemeal swallowing     Oral - Solids   Oral - Puree Piecemeal swallowing   Oral - Regular Piecemeal swallowing;Oral residue;Delayed A-P transit     Electrical stimulation - Oral Phase   Was Electrical Stimulation Used No          Pharyngeal Phase - 10/13/17 1754      Pharyngeal Phase   Pharyngeal Phase Impaired     Pharyngeal - Nectar   Pharyngeal- Nectar Cup Swallow initiation at pyriform sinus;Reduced epiglottic inversion;Pharyngeal residue - valleculae  premature spillage     Pharyngeal - Thin   Pharyngeal- Thin Teaspoon Swallow initiation at vallecula;Reduced epiglottic inversion  premature spillage   Pharyngeal- Thin Cup Swallow initiation at vallecula;Reduced epiglottic inversion;Reduced airway/laryngeal closure;Penetration/Aspiration during swallow;Penetration/Apiration after swallow;Trace aspiration;Pharyngeal residue - valleculae   premature spillage   Pharyngeal Material does not enter airway;Material enters airway, remains ABOVE vocal cords then ejected out;Material enters airway, passes BELOW cords and not ejected out despite cough attempt by patient     Pharyngeal - Solids   Pharyngeal- Puree Swallow initiation at vallecula;Reduced epiglottic inversion;Reduced tongue base retraction;Pharyngeal residue - valleculae   Pharyngeal- Mechanical Soft Swallow initiation at vallecula;Reduced tongue base retraction;Reduced epiglottic inversion;Pharyngeal residue - valleculae   Pharyngeal- Regular Delayed swallow initiation-vallecula;Reduced epiglottic inversion;Reduced tongue base retraction;Pharyngeal residue - valleculae  moderate vallecular residue with regular textures   Pharyngeal- Pill --  trace aspiration of thins when taking pill     Pharyngeal Phase - Comment   Pharyngeal Comment premature spillage from oral cavity, decreased tongue base retraction and epiglottic deflection resulting in vallecular residue     Electrical Stimulation - Pharyngeal Phase   Was Electrical Stimulation Used No          Cricopharyngeal Phase - 10/13/17 1804      Cervical Esophageal Phase   Cervical Esophageal Phase Impaired     Cervical Esophageal Phase - Solids   Puree Prominent cricopharyngeal  segment     Cervical Esophageal Phase - Comment   Cervical Esophageal Comment Esophageal sweep was unremarkable     Previous MBSS at [facility] in September: <<Patient presents with mild-moderate oral and moderate-severe to severe pharyngeal dysphagia. In the oral phase, reduced control/coordination and strength as well as missing dentition and xerostomia resulted in prolonged oral transit times, premature spillage and oral residuals. Oral residuals were cleared by a reflexive swallow and/or liquid wash. Pharyngeal initiation of the swallow was delayed. In the pharyngeal phase, reduced base of tongue retraction, pharyngeal constriction,  hyolaryngeal excursion, epiglottic inversion and laryngeal vestibule closure was observed. UES with significantly decreased opening in both duration and width. Deficits resulted in severe pharyngeal residuals with thicker viscosities (soft solid, puree, nectar thick) from the valleculae to the pyriforms (see image below). A double swallow was largely ineffective in clearing significant amount of residuals. Multiple swallows and liquid washes only partially effective in clearing residuals. Residuals eventually resulted in penetration/aspiration after the swallow and/or during the re-swallow via the interarytenoid space. Deficits also resulted in aspiration of thin liquids during the swallow. Strategies attempted were not successful in significantly minimizing or eliminating aspiration. Patient with variable response to aspiration, but reflexive and cued coughing was ineffective in clearing aspiration. Pill trial not attempted due to the severity of pharyngeal deficits. >>      Plan - 2017-10-15 1812    Clinical Impression Statement Pt assessed in the lateral position with barium tinged thin (tsp, cup), NTL, puree, mechanical soft, regular textures, and barium tablet with thin. Pt with definite improvement from MBSS listed just above (from [facility]), however mild/moderate oropharyngeal dysphagia persists with oral deficits significant for reduced lingual control with liquid presentations resulting in poor posterior oral containment with premature spillage over BOT into pharynx. Swallow trigger generally at the level of the valleculae (except to the pyriforms with NTL). Pt with decreased tongue base retraction and epiglottic deflection resulting in flash penetration at times with cup sips thin and two episodes of sensed trace aspiration of thins (one with a larger sip and one when taking barium tablet with thin). Weak cough elicited, but aspirate not ejected from anterior tracheal wall. Pt also with mild/mod vallecular  residue with mechanical soft textures and moderate with regular textures. Slight prominence in cricopharyngeus noted, however no significant pooling/residue at the UES or in pyriforms (this appears improved per previous report above). Recommend initiating D2/chopped or mech soft (per treating SLP clinical judgment) and small cup sips of thin with implementation of chin tuck (only use with liquids). Pt also benefited from implementation of effortful or hard swallow with solids (however this may also increase fatigue over the course of meal). Periodic throat clear and repeat swallow beneficial with liquids. Pt has made good progress and would likely benefit from ongoing pharyngeal exercises and possibly adduction exercises in deemed appropriate per ENT. Pt requested that this report be sent to his ENT, Dr. Ernestine Conrad. Continue aspiration and reflux precautions.       Patient will benefit from skilled therapeutic intervention in order to improve the following deficits and impairments:   Dysphagia, oropharyngeal phase      G-Codes - 10/15/2017 1813    Functional Assessment Tool Used MBSS; clinical judgment   Functional Limitations Swallowing   Swallow Current Status (F0277) At least 20 percent but less than 40 percent impaired, limited or restricted   Swallow Goal Status (A1287) At least 20 percent but less than 40 percent impaired, limited or restricted  Swallow Discharge Status [phone]) At least 20 percent but less than 40 percent impaired, limited or restricted          Recommendations/Treatment - 10/13/17 1804      Swallow Evaluation Recommendations   SLP Diet Recommendations Dysphagia 2 (chopped);Thin   Liquid Administration via Cup;No straw   Medication Administration Whole meds with puree   Supervision Patient able to self feed;Full supervision/cueing for compensatory strategies   Compensations Slow rate;Small sips/bites;Multiple dry swallows after each bite/sip;Clear throat  intermittently;Chin tuck;Effortful swallow  chin tuck with liquids   Postural Changes Seated upright at 90 degrees;Remain upright for at least 30 minutes after feeds/meals          Prognosis - 10/13/17 1808      Prognosis   Prognosis for Safe Diet Advancement Good   Barriers to Reach Goals Time post onset     Individuals Consulted   Consulted and Agree with Results and Recommendations Patient;Family member/caregiver;Other (Comment)   Family Member Consulted Spouse and treating SLP, Seth Bake   Report Sent to  Referring physician;Facility (Comment)  [facility]      Problem List Patient Active Problem List   Diagnosis Date Noted  . Delirium 09/29/2017  . Gout 09/22/2017  . Rheumatoid arthritis ([place]) 08/28/2017  . Transaminitis 08/28/2017  . Rapidly progressive weakness 08/21/2017  . Severe protein-calorie malnutrition ([place]) 08/21/2017  . Dysphagia 08/17/2017  . Loss of weight 12/15/2016  . Collagenous colitis 09/15/2016  . Elevated LFTs 09/15/2016  . Malignant neoplasm of prostate ([place]) 07/10/2014  . Small fiber neuropathy 06/28/2014  . History of idiopathic urticaria 11/28/2011  . Mixed hyperlipidemia 05/24/2010  . Essential hypertension, benign 05/24/2010  . CORONARY ATHEROSCLEROSIS NATIVE CORONARY ARTERY 05/24/2010   Thank you,  Genene Churn, [place]  [facility] 10/13/2017, 6:15 PM  [place] [address] [place], [place], [address] Phone: [phone]   Fax:  [phone]  Name: Kevin Mathis MRN: [id number] Date of Birth: Feb 04, 1936

## 2017-10-15 ENCOUNTER — Telehealth: Payer: Self-pay | Admitting: Cardiology

## 2017-10-15 ENCOUNTER — Other Ambulatory Visit (HOSPITAL_COMMUNITY): Payer: Medicare Other

## 2017-10-15 ENCOUNTER — Ambulatory Visit (HOSPITAL_COMMUNITY): Payer: Medicare Other | Admitting: Speech Pathology

## 2017-10-15 ENCOUNTER — Encounter (HOSPITAL_COMMUNITY): Payer: Self-pay

## 2017-10-15 NOTE — Telephone Encounter (Signed)
Patient's wife notified and verbalized understanding.  

## 2017-10-15 NOTE — Telephone Encounter (Signed)
Yes, he may hold ASA as requested.

## 2017-10-15 NOTE — Telephone Encounter (Signed)
Patient's wife called asking about stopping his baby aspirin 5 days prior to  Oct 29, 2017 to have his esophagus stretched

## 2017-10-16 DIAGNOSIS — I2510 Atherosclerotic heart disease of native coronary artery without angina pectoris: Secondary | ICD-10-CM | POA: Diagnosis not present

## 2017-10-16 DIAGNOSIS — Z9989 Dependence on other enabling machines and devices: Secondary | ICD-10-CM | POA: Diagnosis not present

## 2017-10-16 DIAGNOSIS — Z87891 Personal history of nicotine dependence: Secondary | ICD-10-CM | POA: Diagnosis not present

## 2017-10-16 DIAGNOSIS — K52831 Collagenous colitis: Secondary | ICD-10-CM | POA: Diagnosis not present

## 2017-10-16 DIAGNOSIS — Z955 Presence of coronary angioplasty implant and graft: Secondary | ICD-10-CM | POA: Diagnosis not present

## 2017-10-16 DIAGNOSIS — Z931 Gastrostomy status: Secondary | ICD-10-CM | POA: Diagnosis not present

## 2017-10-16 DIAGNOSIS — R1310 Dysphagia, unspecified: Secondary | ICD-10-CM | POA: Diagnosis not present

## 2017-10-16 DIAGNOSIS — G629 Polyneuropathy, unspecified: Secondary | ICD-10-CM | POA: Diagnosis not present

## 2017-10-16 DIAGNOSIS — Z8546 Personal history of malignant neoplasm of prostate: Secondary | ICD-10-CM | POA: Diagnosis not present

## 2017-10-16 DIAGNOSIS — R531 Weakness: Secondary | ICD-10-CM | POA: Diagnosis not present

## 2017-10-16 DIAGNOSIS — G4730 Sleep apnea, unspecified: Secondary | ICD-10-CM | POA: Diagnosis not present

## 2017-10-16 DIAGNOSIS — Z9079 Acquired absence of other genital organ(s): Secondary | ICD-10-CM | POA: Diagnosis not present

## 2017-10-19 ENCOUNTER — Encounter: Payer: Self-pay | Admitting: Internal Medicine

## 2017-10-19 ENCOUNTER — Encounter (HOSPITAL_COMMUNITY)
Admission: AD | Admit: 2017-10-19 | Discharge: 2017-10-19 | Disposition: A | Discharge status: Home / Self Care | Payer: Medicare Other | Source: Skilled Nursing Facility

## 2017-10-19 ENCOUNTER — Non-Acute Institutional Stay (SKILLED_NURSING_FACILITY): Payer: Medicare Other | Admitting: Internal Medicine

## 2017-10-19 DIAGNOSIS — R531 Weakness: Secondary | ICD-10-CM | POA: Diagnosis not present

## 2017-10-19 DIAGNOSIS — M109 Gout, unspecified: Secondary | ICD-10-CM | POA: Diagnosis not present

## 2017-10-19 DIAGNOSIS — E43 Unspecified severe protein-calorie malnutrition: Secondary | ICD-10-CM | POA: Diagnosis not present

## 2017-10-19 DIAGNOSIS — K52831 Collagenous colitis: Secondary | ICD-10-CM

## 2017-10-19 DIAGNOSIS — R410 Disorientation, unspecified: Secondary | ICD-10-CM | POA: Diagnosis not present

## 2017-10-19 LAB — URINALYSIS, ROUTINE W REFLEX MICROSCOPIC
Bilirubin Urine: NEGATIVE
GLUCOSE, UA: 50 mg/dL — AB
Hgb urine dipstick: NEGATIVE
Ketones, ur: NEGATIVE mg/dL
LEUKOCYTES UA: NEGATIVE
Nitrite: NEGATIVE
PH: 5 (ref 5.0–8.0)
Protein, ur: NEGATIVE mg/dL
SPECIFIC GRAVITY, URINE: 1.018 (ref 1.005–1.030)

## 2017-10-19 NOTE — Progress Notes (Addendum)
Location:   [name]: 159/P Place of Service:  SNF (31) Provider:  Ree Edman, Silvestre Moment, MD  Patient Care Team: [name], MD as PCP - General (Family Medicine) [name], MD as Consulting Physician (Gastroenterology)  Extended Emergency Contact Information Primary Emergency Contact: Luan Pulling Address: [address]          [place], [place] [address] [name] Phone: [phone] Mobile Phone: [phone] Relation: Spouse  Code Status:  Full Code Goals of care: Advanced Directive information Advanced Directives 10/19/2017  Does Patient Have a Medical Advance Directive? Yes  Type of Advance Directive (No Data)  Does patient want to make changes to medical advance directive? No - Patient declined     Chief Complaint  Patient presents with  . Acute Visit    Pain and Confusion    HPI:  Pt is a 81 y.o. male seen today for an acute visit for Acute Gout in his Finger and ? Confusion noticed by Nurses.  Patient has multiple problems including Collagenous Colitis With Chronic Diarrhea, CAD S/P Stents, HTN, GERD, Gout, Chronic Transaminitis, Hyperlipidemia, Rheumatoid arthritis, Chronic Peripheral Neuropathy, Prostate cancer s/p Prostatectomy, basal cell skin cancer, Oral Lichen Planus  Patient has been in  facility after hospitalization for Weakness and diarrhea . He was in the [facility] from 08/24-09/07.He had PEG tube placed on 08/26.to supplement his PO intake.  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 ALS was ruled out on Biopsy.  In the Facility patient developed  Fever and was transferred to [facility] 09/30. Send back to Facility with no Change in treatment on 10/08. He developed fever in facility and was diagnosed with Pneumonia treated with Bactrim and Azithromycin as he is allergic  to many Antibiotics. He has done well since then. Has been working with therapy. He has been on Steroids for Gout and it was getting tapered but Now patient has developed Gout Effecting few of his joints mainly in Hands and Fingers. Nurses also said he has been little confused about some stuff but that is not new and has been off and on before.  Patient denied any Fever , chills, Cough or SOB. No dysuria. He did clear his Swallowing eval and is on Modified diet with Aspiration Precautions. Though Nutrition is continuing him on Tube feed Q 4 Hours.     Past Medical History:  Diagnosis Date  . Collagenous colitis   . Coronary atherosclerosis of native coronary artery    DES RCA 5/11, LVEF 55%  . Essential hypertension, benign   . GERD (gastroesophageal reflux disease)   . Hiatal hernia   . Mixed hyperlipidemia   . Obstructive sleep apnea   . Prostate cancer ([place])   . Pulmonary nodules   . Rheumatoid arthritis [facility])    Past Surgical History:  Procedure Laterality Date  . APPENDECTOMY    . COLONOSCOPY  06/2016   Dr. Britta Mccreedy: mild diverticulosis in left colon, sessile polyp 3-7 mm in distal descending colon, biopsies of colon to assess for microscopic colitis. path: collagenous colitis. colon polyp was benign polypoid colorectal mucosa   . COLONOSCOPY  04/2014   Dr. Britta Mccreedy: office notes stated 3 tubular adenomas and diverticulosis of left colon   . CORONARY ANGIOPLASTY WITH STENT PLACEMENT  2011  . ESOPHAGOGASTRODUODENOSCOPY  06/2016   Dr. Britta Mccreedy: gastritis, path: chronic active gastritis, negative H.pylori   .  HEMORRHOID SURGERY    . LAPAROSCOPIC CHOLECYSTECTOMY    . PROSTATECTOMY    . Skin cancer resection      Allergies  Allergen Reactions  . Cefprozil   . Doxycycline Hives and Itching  . Pineapple     Other reaction(s): Bleeding (intolerance) Mouth burns and bleeds  . Zenpep [Pancrelipase (Lip-Prot-Amyl)]     Patient does not recall reaction but states he had to stop  because it was "bad". I have listed in allergies, as we are not able to determine what this was.   . Levofloxacin Rash    Unknown REACTION: rash  . Penicillins Rash    REACTION: hives  . Statins Other (See Comments) and Rash    aching aching    Outpatient Encounter Prescriptions as of 10/19/2017  Medication Sig  . acetaminophen (TYLENOL) 100 MG/ML solution Give 15.6 mls by Per G tube route every 6 hours as needed for mild pain or headache  . acetaminophen (TYLENOL) 500 MG tablet Take 500 mg by mouth as needed for mild pain or moderate pain.   Marland Kitchen allopurinol (ZYLOPRIM) 100 MG tablet Place 100 mg into feeding tube 2 (two) times daily.  Marland Kitchen aspirin 81 MG chewable tablet Place 81 mg into feeding tube daily.  . budesonide (PULMICORT) 0.5 MG/2ML nebulizer solution Take 0.5 mg by nebulization 3 (three) times daily.  . calcium-vitamin D (OSCAL WITH D) 250-125 MG-UNIT tablet Place 1 tablet into feeding tube daily.  . cevimeline (EVOXAC) 30 MG capsule Place 30 mg into feeding tube 3 (three) times daily. May add 1 tab at bedtime as needed.  . Cholecalciferol 1000 units capsule Place 1,000 Units into feeding tube daily.  . clobetasol (TEMOVATE) 0.05 % GEL Apply topically as needed.  . collagenase (SANTYL) ointment Apply 1 application topically daily. Apply to sacral wound  . gabapentin (NEURONTIN) 250 MG/5ML solution Place 600 mg into feeding tube 3 (three) times daily.   . Lactobacillus (LACTINEX) PACK 1 packet; gastric tube. Mix with water  . Magnesium Carbonate (MAGONATE) 54 MG/5ML LIQD Place 54 mg into feeding tube daily.  . nitroGLYCERIN (NITROSTAT) 0.4 MG SL tablet Place 1 tablet (0.4 mg total) under the tongue every 5 (five) minutes as needed.  . NON FORMULARY 150 ml free water by Peg Tube qid. Keep HOB elevated 45 degrees during all flushes and bolus feeds. Four times a day  . Nutritional Supplements (FEEDING SUPPLEMENT, JEVITY 1.5 CAL,) LIQD Place 200 mLs into feeding tube every 4 (four) hours.    . pantoprazole sodium (PROTONIX) 40 mg/20 mL PACK Place 40 mg into feeding tube 2 (two) times daily.  . predniSONE (DELTASONE) 10 MG tablet Take 10 mg by mouth daily with breakfast. Starting 10/18/2017-10/20/2017  . predniSONE (DELTASONE) 5 MG tablet Take 5 mg by mouth daily with breakfast. Take from 10/21/2017-10/23/2017  . spironolactone (ALDACTONE) 25 MG tablet Take 25 mg by mouth daily.  . vitamin B-12 (CYANOCOBALAMIN) 1000 MCG tablet Place 1,000 mcg into feeding tube daily.  . [DISCONTINUED] predniSONE (DELTASONE) 10 MG tablet Take 40 mg by mouth daily. Starting 10/06/2017- 10/11/2017  . [DISCONTINUED] predniSONE (DELTASONE) 10 MG tablet Take 30 mg by mouth daily with breakfast. Starting 10/12/2017-10/14/2017  . [DISCONTINUED] predniSONE (DELTASONE) 10 MG tablet Take 20 mg by mouth daily with breakfast. Starting 10/15/2017-10/17/2017  . [DISCONTINUED] sulfamethoxazole-trimethoprim (BACTRIM DS,SEPTRA DS) 800-160 MG tablet Take 1 tablet by mouth 2 (two) times daily.   No facility-administered encounter medications on file as of 10/19/2017.  Review of Systems  Review of Systems  Constitutional: Negative for activity change, appetite change, chills, diaphoresis, fatigue and fever.  HENT: Negative for mouth sores, postnasal drip, rhinorrhea, sinus pain and sore throat.   Respiratory: Negative for apnea, cough, chest tightness, shortness of breath and wheezing.   Cardiovascular: Negative for chest pain, palpitations and leg swelling.  Gastrointestinal: Negative for abdominal distention, abdominal pain, constipation, diarrhea, nausea and vomiting.  Genitourinary: Negative for dysuria and frequency.  Musculoskeletal: Positive for arthralgias, joint swelling and myalgias.  Skin: Negative for rash.  Neurological: Negative for dizziness, syncope, weakness, light-headedness and numbness.  Psychiatric/Behavioral: Negative for behavioral problems, confusion and sleep disturbance.      Immunization History  Administered Date(s) Administered  . Influenza-Unspecified 08/29/2014   Pertinent  Health Maintenance Due  Topic Date Due  . INFLUENZA VACCINE  11/28/2017 (Originally 07/29/2017)  . PNA vac Low Risk Adult (1 of 2 - PCV13) 11/28/2017 (Originally 11/01/2003)   Fall Risk  08/26/2016  Falls in the past year? Yes  Comment Emmi Telephone Survey: data to providers prior to load  Number falls in past yr: 1  Comment Emmi Telephone Survey Actual Response = 1  Injury with Fall? Yes   Functional Status Survey:    Vitals:   10/19/17 1451  BP: 110/74  Pulse: 80  Resp: 16  Temp: 98.3 F (36.8 C)  TempSrc: Oral  Weight: 137 lb 14.4 oz (62.6 kg)   Body mass index is 23.67 kg/m. Physical Exam  Constitutional: He is oriented to person, place, and time. He appears well-developed and well-nourished.  HENT:  Head: Normocephalic.  Mouth/Throat: Oropharynx is clear and moist.  Eyes: Pupils are equal, round, and reactive to light.  Neck: Neck supple.  Cardiovascular: Normal rate and normal heart sounds.   Pulmonary/Chest: Effort normal and breath sounds normal. No respiratory distress. He has no wheezes. He has no rales.  Abdominal: Soft. Bowel sounds are normal. He exhibits distension. There is no tenderness. There is no rebound.  Musculoskeletal:  Moderate edema Bilateral Has swelling in his Distal Joints of his  Fingers with tenderness and redeness  Neurological: He is alert and oriented to person, place, and time.  No focal deficits  Psychiatric: He has a normal mood and affect. His behavior is normal.    Labs reviewed:  Recent Labs  09/27/17 1125 10/07/17 0704 10/12/17 0945  NA 133* 138 136  K 4.1 4.0 4.2  CL 102 104 102  CO2 23 27 26   GLUCOSE 207* 111* 91  BUN 15 20 22*  CREATININE 0.59* 0.62 0.65  CALCIUM 7.8* 7.7* 8.3*    Recent Labs  09/27/17 1125 10/07/17 0704 10/12/17 0945  AST 31 29 38  ALT 29 44 53  ALKPHOS 105 92 115  BILITOT  1.1 0.7 0.4  PROT 5.3* 4.9* 6.3*  ALBUMIN 1.9* 1.9* 2.6*    Recent Labs  09/26/17 1425 09/27/17 1125 10/07/17 0704 10/12/17 0945  WBC 7.5 6.9 7.7 8.3  NEUTROABS 3.7 4.0  --  5.4  HGB 11.0* 10.4* 11.0* 12.5*  HCT 34.7* 32.1* 35.3* 39.8  MCV 101.8* 101.3* 102.6* 102.6*  PLT 85* 84* 111* 130*   Lab Results  Component Value Date   TSH 0.480 08/15/2017   No results found for: HGBA1C Lab Results  Component Value Date   CHOL  05/08/2010    161        ATP III CLASSIFICATION:  <200     mg/dL   Desirable  200-239  mg/dL   Borderline High  >=240    mg/dL   High          HDL 27 (L) 05/08/2010   LDLCALC  05/08/2010    84        Total Cholesterol/HDL:CHD Risk Coronary Heart Disease Risk Table                     Men   Women  1/2 Average Risk   3.4   3.3  Average Risk       5.0   4.4  2 X Average Risk   9.6   7.1  3 X Average Risk  23.4   11.0        Use the calculated Patient Ratio above and the CHD Risk Table to determine the patient's CHD Risk.        ATP III CLASSIFICATION (LDL):  <100     mg/dL   Optimal  100-129  mg/dL   Near or Above                    Optimal  130-159  mg/dL   Borderline  160-189  mg/dL   High  >190     mg/dL   Very High   TRIG 248 (H) 05/08/2010   CHOLHDL 6.0 05/08/2010    Significant Diagnostic Results in last 30 days:  Dg Chest 2 View  Result Date: 10/06/2017 CLINICAL DATA:  History of aspiration. Prostate cancer. Hypertension. Prior history of smoking. EXAM: CHEST  2 VIEW COMPARISON:  09/27/2017.  CT 08/15/2017 . FINDINGS: Mediastinum and hilar structures normal. Changes of bullous COPD. Mild right base infiltrate and right-sided pleural effusion. Small left pleural effusion. No pneumothorax. Diffuse osteopenia degenerative change. Multiple stable compression fractures noted. IMPRESSION: 1.  Bullous COPD. 2. Mild right base infiltrate with small right pleural effusion. Small left pleural effusion. Electronically Signed   By: Marcello Moores  Register    On: 10/06/2017 14:59   Dg Chest 2 View  Result Date: 09/22/2017 CLINICAL DATA:  Shortness of breath. Low oxygen saturation. Leg swelling. EXAM: CHEST  2 VIEW COMPARISON:  Chest CT 08/15/2017, included portion from abdominal CT performed concurrently. FINDINGS: Low lung volumes. Bibasilar atelectasis and trace pleural effusions, pleural fluid better seen on concurrent CT. Normal heart size and mediastinal contours. No pulmonary edema. No pneumothorax. No confluent airspace disease. Wedge compression fracture of lower thoracic vertebra is chronic and unchanged. IMPRESSION: Low lung volumes with bibasilar atelectasis and trace pleural effusions. Electronically Signed   By: Jeb Levering M.D.   On: 09/22/2017 05:03   Dg Elbow Complete Left (3+view)  Result Date: 10/06/2017 CLINICAL DATA:  The patient suffered a blow to the left elbow today while walking with onset of pain. Initial encounter. EXAM: LEFT ELBOW - COMPLETE 3+ VIEW COMPARISON:  None. FINDINGS: No fracture or dislocation is seen. Elbow joint effusion is noted. Marked soft tissue swelling posterior to the olecranon is identified. IMPRESSION: Negative for fracture. Marked soft tissue swelling posterior to the olecranon consistent with bursitis. Small elbow joint effusion is also identified. These findings are nonspecific but can be seen in gout. Electronically Signed   By: Inge Rise M.D.   On: 10/06/2017 14:58   Mr Jeri Cos NG Contrast  Result Date: 10/13/2017 CLINICAL DATA:  Impaired gait.  Headache and neck pain EXAM: MRI HEAD WITHOUT AND WITH CONTRAST TECHNIQUE: Multiplanar, multiecho pulse sequences of the brain and surrounding structures were obtained without and with intravenous  contrast. CONTRAST:  58mL MULTIHANCE GADOBENATE DIMEGLUMINE 529 MG/ML IV SOLN COMPARISON:  CT head 08/08/2017 FINDINGS: Brain: Generalized atrophy without hydrocephalus. Scattered small white matter hyperintensities, mild. Negative for acute infarct.  Negative for hemorrhage, mass. Normal enhancement postcontrast administration. Vascular: Normal arterial flow voids Skull and upper cervical spine: No focal bony lesion. C1-2 arthropathy with prominent pannus posterior to the dens. No compression of the cord. Sinuses/Orbits: Air-fluid level in the maxillary sinus bilaterally. Bilateral cataract removal Other: None IMPRESSION: Atrophy with mild chronic white matter changes. No acute intracranial abnormality C1-2 arthropathy with prominent pannus posterior to the dens. No cord compression. Air-fluid levels in the maxillary sinus bilaterally. Electronically Signed   By: Franchot Gallo M.D.   On: 10/13/2017 13:06   Mr Cervical Spine W Wo Contrast  Result Date: 10/13/2017 CLINICAL DATA:  Impaired gait. Headache and neck pain. Prostate cancer. EXAM: MRI CERVICAL SPINE WITHOUT AND WITH CONTRAST TECHNIQUE: Multiplanar and multiecho pulse sequences of the cervical spine, to include the craniocervical junction and cervicothoracic junction, were obtained without and with intravenous contrast. CONTRAST:  15 mL MultiHance IV E COMPARISON:  Cervical radiographs 01/14/2010 FINDINGS: Alignment: Mild anterolisthesis C4-5 and mild retrolisthesis C5-6 Vertebrae: Negative for fracture or mass. No evidence of metastatic disease. Cord: Normal cord signal.  No cord lesion. Posterior Fossa, vertebral arteries, paraspinal tissues: Negative Disc levels: C1-2: Arthropathy with prominent pannus posterior to the dens. No cord compression C2-3:  Small central disc protrusion without cord deformity C3-4: Disc degeneration with mild uncinate spurring and mild facet degeneration bilaterally. Mild foraminal narrowing bilaterally C4-5: Disc degeneration and spondylosis. Mild anterior slip. Right-sided facet hypertrophy. Mild spinal stenosis and mild to moderate foraminal stenosis bilaterally C5-6: Mild retrolisthesis. Disc degeneration with diffuse endplate spurring and a small central disc  protrusion. Moderate spinal stenosis and moderate left foraminal encroachment C6-7: Small central disc protrusion. No significant spinal or foraminal stenosis C7-T1:  Mild anterior slip.  Negative for stenosis IMPRESSION: C1-2 arthropathy with prominent pannus formation Mild foraminal narrowing bilaterally at C3-4 Mild spinal stenosis and mild to moderate foraminal stenosis bilaterally at C4-5 Moderate spinal stenosis C5-6 with moderate left foraminal encroachment Electronically Signed   By: Franchot Gallo M.D.   On: 10/13/2017 13:12   Ct Abdomen Pelvis W Contrast  Result Date: 09/22/2017 CLINICAL DATA:  Abdominal distension EXAM: CT ABDOMEN AND PELVIS WITH CONTRAST TECHNIQUE: Multidetector CT imaging of the abdomen and pelvis was performed using the standard protocol following bolus administration of intravenous contrast. CONTRAST:  163mL ISOVUE-300 IOPAMIDOL (ISOVUE-300) INJECTION 61% COMPARISON:  CT abdomen pelvis 08/15/2017 FINDINGS: Lower chest: There is bibasilar atelectasis. Trace left pleural effusion. Previously described nodule of the right lung base is not clearly visualized on this study, possibly due to adjacent atelectasis. Hepatobiliary: The liver is small with a mildly nodular contour. There is moderate perihepatic ascites. Status post cholecystectomy. Pancreas: Normal contours without ductal dilatation. No peripancreatic fluid collection. Spleen: Unchanged low-attenuation focus in the spleen. Moderate perisplenic fluid. Adrenals/Urinary Tract: --Adrenal glands: Normal. --Right kidney/ureter: No hydronephrosis or perinephric stranding. No nephrolithiasis. No obstructing ureteral stones. --Left kidney/ureter: No hydronephrosis or perinephric stranding. No nephrolithiasis. No obstructing ureteral stones. --Urinary bladder: Unremarkable. Stomach/Bowel: --Stomach/Duodenum: There is a gastrostomy tube within the stomach. Stomach is decompressed. Normal course and caliber of the duodenum. --Small  bowel: No dilatation or inflammation. --Colon: There are numerous diverticula of the sigmoid colon. The stool throughout the rectum and sigmoid colon is extremely dense and has the appearance of contrast material. However, there is  no proximal enteric contrast. I suspect this contrast material is left over from the prior study. --Appendix: Surgically absent. Vascular/Lymphatic: Atherosclerotic calcification is present within the non-aneurysmal abdominal aorta, without hemodynamically significant stenosis. No abdominal or pelvic lymphadenopathy. Reproductive: No free fluid in the pelvis. Musculoskeletal. Multilevel degenerative disc disease and facet arthrosis. No bony spinal canal stenosis. Other: Large amount of fluid within the abdomen and pelvis. Fluid tracks from the perihepatic and perisplenic locations along the paracolic gutters into the lower abdomen. IMPRESSION: 1. Large amount of intraperitoneal free fluid, of uncertain origin. Correlation with albumin levels may be helpful. 2. Hyperdense stool within the distal colon, likely indicating retained contrast material. The most recent scan with enteric contrast that I have record of for this patient was performed on 08/15/17. This suggests reduced colonic motility. 3.  Aortic Atherosclerosis (ICD10-I70.0). 4. Trace left pleural effusion. 5. Previously described right lung base pulmonary nodule is not clearly seen on this study, possibly due to adjacent atelectasis. Electronically Signed   By: Ulyses Jarred M.D.   On: 09/22/2017 05:16   Dg Op Swallowing Func-medicare/speech Path  Result Date: 10/13/2017 [facility] [address] [place], [place], [address] Phone: [phone]   Fax:  [phone] Modified Barium Swallow Patient Details Name: TADARRIUS BURCH MRN: [id number] Date of Birth: [birth year] No Data Recorded Encounter Date: 10/13/2017   End of Session - 10/13/17 1811   Visit Number 1  Number of Visits 1   Authorization Type Medicare  SLP Start Time 1430  SLP Stop Time  1510  SLP Time Calculation (min) 40 min  Activity Tolerance Patient tolerated treatment well  Past Medical History: Diagnosis Date . Collagenous colitis  . Coronary atherosclerosis of native coronary artery   DES RCA 5/11, LVEF 55% . Essential hypertension, benign  . GERD (gastroesophageal reflux disease)  . Hiatal hernia  . Mixed hyperlipidemia  . Obstructive sleep apnea  . Prostate cancer ([place])  . Pulmonary nodules  . Rheumatoid arthritis [facility])  Past Surgical History: Procedure Laterality Date . APPENDECTOMY   . COLONOSCOPY  06/2016  Dr. Britta Mccreedy: mild diverticulosis in left colon, sessile polyp 3-7 mm in distal descending colon, biopsies of colon to assess for microscopic colitis. path: collagenous colitis. colon polyp was benign polypoid colorectal mucosa  . COLONOSCOPY  04/2014  Dr. Britta Mccreedy: office notes stated 3 tubular adenomas and diverticulosis of left colon  . CORONARY ANGIOPLASTY WITH STENT PLACEMENT  2011 . ESOPHAGOGASTRODUODENOSCOPY  06/2016  Dr. Britta Mccreedy: gastritis, path: chronic active gastritis, negative H.pylori  . HEMORRHOID SURGERY   . LAPAROSCOPIC CHOLECYSTECTOMY   . PROSTATECTOMY   . Skin cancer resection   There were no vitals filed for this visit.   Subjective Assessment - 10/13/17 1745   Subjective "It is getting a little better."  Patient is accompained by: Family member  Special Tests MBSS  Currently in Pain? No/denies    General - 10/13/17 1746    General Information  Date of Onset 08/21/17  HPI Braxten Memmer is a 81 yo male who was referred for MBSS by Dr. Veleta Miners. Pt is currently at [facility] for rehab following a hospitalization at [facility] in [place]. Mr. Ashby history is significant for chronic transaminitis, gout, EtOH abuse, collagenous colitis with chronic diarrhea, CAD s/p stents, HTN, GERD, HH, HLD, OSA on CPAP, RA, connective tissue disease, chronic peripheral neuropathy (etiology unclear), prostate cancer s/p  prostatectomy, basal cell cancer s/p skin biopsy, oral lichen planus, and uriticaria who presented to [facility]  on 8/24 with weakness and weight loss with associated lactic acidosis. He had a diagnostic workup but it was decided that his weakness was likely due to his chronic and uncontrolled diarrhea. He was also found to have severe dysphagia and required PEG tube placement. 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 Chest x-ray on 10/06/2017 shows: Mild right base infiltrate with small right pleural effusion. Small left pleural effusion. MR Brain: Atrophy with mild chronic white matter changes. No acute intracranial abnormality C1-2 arthropathy with prominent pannus posterior to the dens. No cord compression.MR C-spine shows: C1-2 arthropathy with prominent pannus formation, Mild foraminal narrowing bilaterally at C3-4Mild spinal stenosis and mild to moderate foraminal stenosis bilaterally at C4-5 Moderate spinal stenosis C5-6 with moderate left foraminal encroachment. Pt was evaluated by Dr. Ernestine Conrad on Monday and he will have transnasal/esophageal endoscopy balloon dilation on 10/29/2017. The results from that visit are currently unavailable on EPIC, however there is a note from ENT, Dr. Redmond Baseman from 08/20/2017 which noted good glottal closure with symmetrical vocal fold adduction and abduction. Pt's spouse reports that they were told that he had some bowing of his vocal folds after his ENT appointment this past Monday.  Type of Study MBS-Modified Barium Swallow Study  Previous Swallow Assessment 08/2017 at [facility]- PEG with thins  Diet Prior to this Study PEG tube;Thin liquids  Temperature Spikes Noted No  Respiratory Status Room air  History of Recent Intubation No  Behavior/Cognition Alert;Cooperative;Pleasant mood  Oral Cavity Assessment Within Functional Limits  Oral Care Completed by SLP No  Oral Cavity -  Dentition Adequate natural dentition  Vision Functional for self feeding  Self-Feeding Abilities Able to feed self  Patient Positioning Upright in chair  Baseline Vocal Quality Normal;Low vocal intensity  Volitional Cough Strong  Volitional Swallow Able to elicit  Anatomy Within functional limits  Pharyngeal Secretions Not observed secondary MBS    Oral Preparation/Oral Phase - 10/13/17 1753    Oral Preparation/Oral Phase  Oral Phase Impaired   Oral - Thin  Oral - Thin Cup Decreased bolus cohesion;Piecemeal swallowing   Oral - Solids  Oral - Puree Piecemeal swallowing  Oral - Regular Piecemeal swallowing;Oral residue;Delayed A-P transit   Electrical stimulation - Oral Phase  Was Electrical Stimulation Used No    Pharyngeal Phase - 10/13/17 1754    Pharyngeal Phase  Pharyngeal Phase Impaired   Pharyngeal - Nectar  Pharyngeal- Nectar Cup Swallow initiation at pyriform sinus;Reduced epiglottic inversion;Pharyngeal residue - valleculae premature spillage   Pharyngeal - Thin  Pharyngeal- Thin Teaspoon Swallow initiation at vallecula;Reduced epiglottic inversion premature spillage  Pharyngeal- Thin Cup Swallow initiation at vallecula;Reduced epiglottic inversion;Reduced airway/laryngeal closure;Penetration/Aspiration during swallow;Penetration/Apiration after swallow;Trace aspiration;Pharyngeal residue - valleculae premature spillage  Pharyngeal Material does not enter airway;Material enters airway, remains ABOVE vocal cords then ejected out;Material enters airway, passes BELOW cords and not ejected out despite cough attempt by patient   Pharyngeal - Solids  Pharyngeal- Puree Swallow initiation at vallecula;Reduced epiglottic inversion;Reduced tongue base retraction;Pharyngeal residue - valleculae  Pharyngeal- Mechanical Soft Swallow initiation at vallecula;Reduced tongue base retraction;Reduced epiglottic inversion;Pharyngeal residue - valleculae  Pharyngeal- Regular Delayed swallow initiation-vallecula;Reduced  epiglottic inversion;Reduced tongue base retraction;Pharyngeal residue - valleculae moderate vallecular residue with regular textures  Pharyngeal- Pill -- trace aspiration of thins when taking pill   Pharyngeal Phase - Comment  Pharyngeal Comment premature spillage from oral cavity, decreased tongue base retraction and epiglottic deflection resulting in vallecular residue  Electrical Stimulation - Pharyngeal Phase  Was Electrical Stimulation Used No    Cricopharyngeal Phase - 10-15-2017 1804    Cervical Esophageal Phase  Cervical Esophageal Phase Impaired   Cervical Esophageal Phase - Solids  Puree Prominent cricopharyngeal segment   Cervical Esophageal Phase - Comment  Cervical Esophageal Comment Esophageal sweep was unremarkable  Previous MBSS at [facility] in September: <<Patient presents with mild-moderate oral and moderate-severe to severe pharyngeal dysphagia. In the oral phase, reduced control/coordination and strength as well as missing dentition and xerostomia resulted in prolonged oral transit times, premature spillage and oral residuals. Oral residuals were cleared by a reflexive swallow and/or liquid wash. Pharyngeal initiation of the swallow was delayed. In the pharyngeal phase, reduced base of tongue retraction, pharyngeal constriction, hyolaryngeal excursion, epiglottic inversion and laryngeal vestibule closure was observed. UES with significantly decreased opening in both duration and width. Deficits resulted in severe pharyngeal residuals with thicker viscosities (soft solid, puree, nectar thick) from the valleculae to the pyriforms (see image below). A double swallow was largely ineffective in clearing significant amount of residuals. Multiple swallows and liquid washes only partially effective in clearing residuals. Residuals eventually resulted in penetration/aspiration after the swallow and/or during the re-swallow via the interarytenoid space. Deficits also resulted in aspiration of thin liquids  during the swallow. Strategies attempted were not successful in significantly minimizing or eliminating aspiration. Patient with variable response to aspiration, but reflexive and cued coughing was ineffective in clearing aspiration. Pill trial not attempted due to the severity of pharyngeal deficits. >>   Plan - October 15, 2017 1812   Clinical Impression Statement Pt assessed in the lateral position with barium tinged thin (tsp, cup), NTL, puree, mechanical soft, regular textures, and barium tablet with thin. Pt with definite improvement from MBSS listed just above (from [facility]), however mild/moderate oropharyngeal dysphagia persists with oral deficits significant for reduced lingual control with liquid presentations resulting in poor posterior oral containment with premature spillage over BOT into pharynx. Swallow trigger generally at the level of the valleculae (except to the pyriforms with NTL). Pt with decreased tongue base retraction and epiglottic deflection resulting in flash penetration at times with cup sips thin and two episodes of sensed trace aspiration of thins (one with a larger sip and one when taking barium tablet with thin). Weak cough elicited, but aspirate not ejected from anterior tracheal wall. Pt also with mild/mod vallecular residue with mechanical soft textures and moderate with regular textures. Slight prominence in cricopharyngeus noted, however no significant pooling/residue at the UES or in pyriforms (this appears improved per previous report above). Recommend initiating D2/chopped or mech soft (per treating SLP clinical judgment) and small cup sips of thin with implementation of chin tuck (only use with liquids). Pt also benefited from implementation of effortful or hard swallow with solids (however this may also increase fatigue over the course of meal). Periodic throat clear and repeat swallow beneficial with liquids. Pt has made good progress and would likely benefit from ongoing pharyngeal  exercises and possibly adduction exercises in deemed appropriate per ENT. Pt requested that this report be sent to his ENT, Dr. Ernestine Conrad. Continue aspiration and reflux precautions.   Patient will benefit from skilled therapeutic intervention in order to improve the following deficits and impairments:  Dysphagia, oropharyngeal phase   G-Codes - Oct 15, 2017 1813   Functional Assessment Tool Used MBSS; clinical judgment  Functional Limitations Swallowing  Swallow Current Status (V7616) At least 20 percent but less than 40 percent impaired, limited or restricted  Swallow Goal Status [phone]) At least 20 percent but less than 40 percent impaired, limited or restricted  Swallow Discharge Status [phone]) At least 20 percent but less than 40 percent impaired, limited or restricted    Recommendations/Treatment - 10/13/17 1804    Swallow Evaluation Recommendations  SLP Diet Recommendations Dysphagia 2 (chopped);Thin  Liquid Administration via Cup;No straw  Medication Administration Whole meds with puree  Supervision Patient able to self feed;Full supervision/cueing for compensatory strategies  Compensations Slow rate;Small sips/bites;Multiple dry swallows after each bite/sip;Clear throat intermittently;Chin tuck;Effortful swallow chin tuck with liquids  Postural Changes Seated upright at 90 degrees;Remain upright for at least 30 minutes after feeds/meals    Prognosis - 10/13/17 1808    Prognosis  Prognosis for Safe Diet Advancement Good  Barriers to Reach Goals Time post onset   Individuals Consulted  Consulted and Agree with Results and Recommendations Patient;Family member/caregiver;Other (Comment)  Family Member Consulted Spouse and treating SLP, Seth Bake  Report Sent to  Referring physician;Facility (Comment) [facility]  Problem List Patient Active Problem List  Diagnosis Date Noted . Delirium 09/29/2017 . Gout 09/22/2017 . Rheumatoid arthritis ([place]) 08/28/2017 . Transaminitis 08/28/2017 . Rapidly progressive weakness  08/21/2017 . Severe protein-calorie malnutrition ([place]) 08/21/2017 . Dysphagia 08/17/2017 . Loss of weight 12/15/2016 . Collagenous colitis 09/15/2016 . Elevated LFTs 09/15/2016 . Malignant neoplasm of prostate ([place]) 07/10/2014 . Small fiber neuropathy 06/28/2014 . History of idiopathic urticaria 11/28/2011 . Mixed hyperlipidemia 05/24/2010 . Essential hypertension, benign 05/24/2010 . CORONARY ATHEROSCLEROSIS NATIVE CORONARY ARTERY 05/24/2010 Thank you, Genene Churn, [place] 10/13/2017, 6:15 PM [facility] [address] [place], [place], [address] Phone: [phone]   Fax:  [phone] Name: Kevin Mathis MRN: [id number] Date of Birth: 10/17/1936 CLINICAL DATA:  Dysphagia, has had a feeding tube in since August 2018, has been taking some liquids, past history of coronary artery disease, rheumatoid arthritis, prostate cancer, hypertension, former smoker EXAM: MODIFIED BARIUM SWALLOW TECHNIQUE: Different consistencies of barium were administered orally to the patient by the Speech Pathologist. Imaging of the pharynx was performed in the lateral projection. FLUOROSCOPY TIME:  Fluoroscopy Time:  3 minutes 18 seconds Radiation Exposure Index (if provided by the fluoroscopic device): 19.9 mGy Number of Acquired Spot Images: multiple fluoroscopic screen captures COMPARISON:  None FINDINGS: Thin liquid- with presentation by teaspoon, normal swelling is seen. With cup presentation, premature spillover of contrast is identified with laryngeal penetration and aspiration. A weak spontaneous cough reflex was seen. Decreased laryngeal penetration occurred with chin tuck maneuver. Additional swallows by cup later in the exam demonstrated persistence of premature spillover, delayed initiation, and flash laryngeal penetration. Nectar thick liquid- initial swallow of nectar consistency showed no abnormalities. A second swallow by cup showed premature spillover and  delayed initiation without laryngeal penetration or aspiration. Honey- not evaluated Pure- no laryngeal penetration or aspiration. Vallecular residuals were identified. Decreased vallecular residuals after an effortful swallow. Prior cricopharyngeus muscle noted. Cracker-vallecular residuals. No laryngeal penetration or aspiration. Pure with cracker- no laryngeal penetration or aspiration Barium tablet - 12.5 mm diameter parapelvic passed from oral cavity to stomach without obstruction. Premature spillover of view thin liquid contrast utilized to swallow the pill, with flash laryngeal penetration aspiration just below the vocal cords. IMPRESSION: Swallowing dysfunction as above. Please refer to the Speech Pathologists report for complete details and recommendations. Electronically Signed   By: Lavonia Dana M.D.   On: 10/13/2017 15:00   Dg Chest Port 1 View  Result Date: 09/27/2017 CLINICAL DATA:  Sepsis. EXAM: PORTABLE CHEST 1 VIEW COMPARISON:  09/22/2017 FINDINGS: The cardiomediastinal silhouette is unchanged and within normal limits. The lungs remain hypoinflated, and there is slight elevation of the right hemidiaphragm. No lobar consolidation, edema, sizable pleural effusion, or pneumothorax is identified. IMPRESSION: Hypoinflation.  No evidence of pneumonia. Electronically Signed   By: Logan Bores M.D.   On: 09/27/2017 12:09    Assessment/Plan  Acute gout of multiple sites Patient already on Allopurinol. He says he had been on Colchicine for very long time. He was taken off it due to some concern for Diarrhea due to Meds. Since his diarrhea is resolved will restart him On Colchicine and also will increase his Prednisone to 30 mg for few days and then Start taper.  Confusion Patient seems at baseline. Will check UA again  Rapidly progressive weakness With Neuropathy. Was seen By Neurology in [place]. Do not have notes available yet. But it seem that they are planning to do  further work up  for his weakness and Neuropathy.  Per their recommendations to increase Neurontin to 600 mg TID for his neuropathic pain. . Patient tolerating high dose well. High Dose can also cause some confusion will follow.   Severe protein-calorie malnutrition  On Tube Feed. Albumin is now up to 2.6 Will D/W Nutrition about Tube feeds   Collagenous colitis Patient doing well with No More Diarrhea on Budesonide. Dysphagia  Tolerating PO feeds with Precautions. Mild cirrhosis with Ascitics portal hypertension and Low albumin Will Need GI follow up. Anemia and Thrombocytopenia Most likely due to chronic Disease. Vit B12 normal Addendum Per Family request patient was started on Oxycodone Prn for Pain. Will also get CRP and Uric acid level.  Family/ staff Communication:   Labs/tests ordered:   Total time spent in this patient care encounter was 45_ minutes; greater than 50% of the visit spent counseling patient, reviewing records , Labs and coordinating care for problems addressed at this encounter.

## 2017-10-20 ENCOUNTER — Encounter: Payer: Self-pay | Admitting: Internal Medicine

## 2017-10-21 LAB — URINE CULTURE: Culture: NO GROWTH

## 2017-10-21 NOTE — Progress Notes (Signed)
Cardiology Office Note  Date: 10/26/2017   ID: Kevin Mathis, DOB August 25, 1936, MRN [id number]  PCP: [name], MD  Primary Cardiologist: Rozann Lesches, MD   Chief Complaint  Patient presents with  . Coronary Artery Disease    History of Present Illness: Kevin Mathis is a 81 y.o. male last seen in May. He has had substantial decline since I last saw him. Records indicate diagnosis of collagenous colitis with chronic diarrhea and subsequent weakness, now in a nursing facility with PEG tube in place. He has a neuromyopathy that is metabolic.  It does look like he is making progress in rehabilitation however.  He feels like he is getting stronger.  His weight has picked up.  He is still following with ENT at [facility], and his swallowing has improved.  He does not report any active angina symptoms or nitroglycerin use.  I reviewed his medications which are outlined below.  He has had some lower leg edema, likely multifactorial.  He is on steroids and also has relatively low protein stores.  LVEF was normal by echocardiogram in May of this year.  Recent ECG reviewed as well.  Past Medical History:  Diagnosis Date  . Collagenous colitis   . Coronary atherosclerosis of native coronary artery    DES RCA 5/11, LVEF 55%  . Essential hypertension, benign   . GERD (gastroesophageal reflux disease)   . Hiatal hernia   . Mixed hyperlipidemia   . Obstructive sleep apnea   . Prostate cancer ([place])   . Pulmonary nodules   . Rheumatoid arthritis [facility])     Past Surgical History:  Procedure Laterality Date  . APPENDECTOMY    . COLONOSCOPY  06/2016   Dr. Britta Mccreedy: mild diverticulosis in left colon, sessile polyp 3-7 mm in distal descending colon, biopsies of colon to assess for microscopic colitis. path: collagenous colitis. colon polyp was benign polypoid colorectal mucosa   . COLONOSCOPY  04/2014   Dr. Britta Mccreedy: office notes stated 3 tubular adenomas and diverticulosis of left colon   .  CORONARY ANGIOPLASTY WITH STENT PLACEMENT  2011  . ESOPHAGOGASTRODUODENOSCOPY  06/2016   Dr. Britta Mccreedy: gastritis, path: chronic active gastritis, negative H.pylori   . HEMORRHOID SURGERY    . LAPAROSCOPIC CHOLECYSTECTOMY    . PROSTATECTOMY    . Skin cancer resection     Current Outpatient Prescriptions on File Prior to Visit  Medication Sig Dispense Refill  . acetaminophen (TYLENOL) 500 MG tablet Take 500 mg by mouth as needed for mild pain or moderate pain.     Marland Kitchen allopurinol (ZYLOPRIM) 100 MG tablet Place 100 mg into feeding tube 2 (two) times daily.    Marland Kitchen aspirin 81 MG chewable tablet Place 81 mg into feeding tube daily.    . budesonide (PULMICORT) 0.5 MG/2ML nebulizer solution Take 0.5 mg by nebulization 3 (three) times daily.    . calcium-vitamin D (OSCAL WITH D) 250-125 MG-UNIT tablet Place 1 tablet into feeding tube daily.    . cevimeline (EVOXAC) 30 MG capsule Place 30 mg into feeding tube 3 (three) times daily. May add 1 tab at bedtime as needed.    . Cholecalciferol 1000 units capsule Place 1,000 Units into feeding tube daily.    . clobetasol (TEMOVATE) 0.05 % GEL Apply topically as needed.    . gabapentin (NEURONTIN) 250 MG/5ML solution Place 600 mg into feeding tube 3 (three) times daily.     . Lactobacillus (LACTINEX) PACK 1 packet; gastric tube. Mix with  water    . Magnesium Carbonate (MAGONATE) 54 MG/5ML LIQD Place 54 mg into feeding tube daily.    . nitroGLYCERIN (NITROSTAT) 0.4 MG SL tablet Place 1 tablet (0.4 mg total) under the tongue every 5 (five) minutes as needed. 25 tablet 3  . NON FORMULARY 150 ml free water by Peg Tube qid. Keep HOB elevated 45 degrees during all flushes and bolus feeds. Four times a day    . Nutritional Supplements (FEEDING SUPPLEMENT, JEVITY 1.5 CAL,) LIQD Place 200 mLs into feeding tube every 4 (four) hours.     . pantoprazole sodium (PROTONIX) 40 mg/20 mL PACK Place 40 mg into feeding tube 2 (two) times daily.    . predniSONE (DELTASONE) 10 MG  tablet Take 10 mg by mouth daily with breakfast. Starting 10/18/2017-10/20/2017    . predniSONE (DELTASONE) 5 MG tablet Take 5 mg by mouth daily with breakfast. Take from 10/21/2017-10/23/2017    . spironolactone (ALDACTONE) 25 MG tablet Take 25 mg by mouth daily.    . vitamin B-12 (CYANOCOBALAMIN) 1000 MCG tablet Place 1,000 mcg into feeding tube daily.    . collagenase (SANTYL) ointment Apply 1 application topically daily. Apply to sacral wound     No current facility-administered medications on file prior to visit.     Allergies:  Cefprozil; Doxycycline; Pineapple; Zenpep [pancrelipase (lip-prot-amyl)]; Levofloxacin; Penicillins; and Statins   Social History: The patient  reports that he quit smoking about 25 years ago. His smoking use included Cigarettes. He started smoking about 65 years ago. He has a 40.00 pack-year smoking history. He has never used smokeless tobacco. He reports that he drinks alcohol. He reports that he does not use drugs.   ROS:  Please see the history of present illness. Otherwise, complete review of systems is positive for progressive hearing loss.  All other systems are reviewed and negative.   Physical Exam: VS:  BP 110/78   Pulse 87   Ht 5\' 4"  (1.626 m)   Wt 141 lb (64 kg)   SpO2 98%   BMI 24.20 kg/m , BMI Body mass index is 24.2 kg/m.  Wt Readings from Last 3 Encounters:  10/26/17 141 lb (64 kg)  10/19/17 137 lb 14.4 oz (62.6 kg)  09/27/17 151 lb (68.5 kg)    General: Chronically ill-appearing male, using a walker. HEENT: Conjunctiva and lids normal, oropharynx clear. Neck: Supple, no elevated JVP or carotid bruits, no thyromegaly. Lungs: Clear to auscultation, nonlabored breathing at rest. Cardiac: Regular rate and rhythm, no S3, soft systolic murmur, no pericardial rub. Abdomen: Soft, nontender, bowel sounds present, no guarding or rebound. Extremities: Mild ankle edema, distal pulses 2+. Skin: Warm and dry. Musculoskeletal: No  kyphosis. Neuropsychiatric: Alert and oriented x3, affect grossly appropriate.  ECG: I personally reviewed the tracing from 09/27/2017 which showed sinus rhythm.  Recent Labwork: 08/15/2017: TSH 0.480 09/22/2017: B Natriuretic Peptide 142.0 10/12/2017: ALT 53; AST 38; BUN 22; Creatinine, Ser 0.65; Hemoglobin 12.5; Platelets 130; Potassium 4.2; Sodium 136   Other Studies Reviewed Today:  Echocardiogram 05/28/2017: Study Conclusions  - Left ventricle: The cavity size was normal. Wall thickness was at   the upper limits of normal. Systolic function was normal. The   estimated ejection fraction was in the range of 55% to 60%. Wall   motion was normal; there were no regional wall motion   abnormalities. Doppler parameters are consistent with abnormal   left ventricular relaxation (grade 1 diastolic dysfunction). - Aortic valve: Functionally bicuspid; mildly calcified leaflets.  There was mild stenosis. Mean gradient (S): 9 mm Hg. Valve area   (VTI): 1.72 cm^2. Peak velocity ratio of LVOT to aortic valve:   0.41. - Mitral valve: Calcified annulus. There was mild regurgitation. - Right atrium: Central venous pressure (est): 3 mm Hg. - Tricuspid valve: There was mild regurgitation. - Pulmonary arteries: PA peak pressure: 24 mm Hg (S). - Pericardium, extracardiac: There was no pericardial effusion.  Impressions:  - Upper normal LV wall thickness with LVEF 55-60% and grade 1   diastolic dysfunction. Mild mitral annular calcification with   mild mitral regurgitation. Functionally bicuspid aortic valve   with mild aortic stenosis. Mild tricuspid regurgitation with PASP   24 mmHg.  Assessment and Plan:  1.  CAD status post DES to the RCA in 2011.  He does not report active angina on medical therapy at this time.  LVEF 55-60% by echocardiogram earlier this year.  Continue with observation for now.  2.  Functionally bicuspid aortic valve with mild aortic stenosis, asymptomatic.  3.   Collagenous colitis, currently with PEG tube in place.  He is undergoing rehabilitation.  Still receiving supplemental nutrition.  4.  History of hyperlipidemia, no longer on Lovaza.  Current medicines were reviewed with the patient today.  Disposition: Follow-up in 4 months.  Signed, Satira Sark, MD, [facility] 10/26/2017 4:00 PM    [facility] [place], [place], [place] [address] Phone: [phone]; Fax: [phone]

## 2017-10-26 ENCOUNTER — Encounter: Payer: Self-pay | Admitting: Cardiology

## 2017-10-26 ENCOUNTER — Ambulatory Visit (INDEPENDENT_AMBULATORY_CARE_PROVIDER_SITE_OTHER): Payer: Medicare Other | Admitting: Cardiology

## 2017-10-26 VITALS — BP 110/78 | HR 87 | Ht 64.0 in | Wt 141.0 lb

## 2017-10-26 DIAGNOSIS — K52831 Collagenous colitis: Secondary | ICD-10-CM | POA: Diagnosis not present

## 2017-10-26 DIAGNOSIS — I25119 Atherosclerotic heart disease of native coronary artery with unspecified angina pectoris: Secondary | ICD-10-CM

## 2017-10-26 DIAGNOSIS — E782 Mixed hyperlipidemia: Secondary | ICD-10-CM | POA: Diagnosis not present

## 2017-10-26 DIAGNOSIS — I350 Nonrheumatic aortic (valve) stenosis: Secondary | ICD-10-CM | POA: Diagnosis not present

## 2017-10-26 DIAGNOSIS — I2510 Atherosclerotic heart disease of native coronary artery without angina pectoris: Secondary | ICD-10-CM | POA: Diagnosis not present

## 2017-10-26 NOTE — Patient Instructions (Signed)
Medication Instructions:  Your physician recommends that you continue on your current medications as directed. Please refer to the Current Medication list given to you today.  Labwork: NONE  Testing/Procedures: NONE  Follow-Up: Your physician wants you to follow-up in: 4 MONTHS WITH DR. MCDOWELL. You will receive a reminder letter in the mail two months in advance. If you don't receive a letter, please call our office to schedule the follow-up appointment.  Any Other Special Instructions Will Be Listed Below (If Applicable).  If you need a refill on your cardiac medications before your next appointment, please call your pharmacy. 

## 2017-10-29 DIAGNOSIS — R1314 Dysphagia, pharyngoesophageal phase: Secondary | ICD-10-CM | POA: Diagnosis not present

## 2017-10-29 DIAGNOSIS — B3781 Candidal esophagitis: Secondary | ICD-10-CM | POA: Diagnosis not present

## 2017-10-29 DIAGNOSIS — R1310 Dysphagia, unspecified: Secondary | ICD-10-CM | POA: Diagnosis not present

## 2017-10-29 DIAGNOSIS — S0990XD Unspecified injury of head, subsequent encounter: Secondary | ICD-10-CM | POA: Diagnosis not present

## 2017-10-29 DIAGNOSIS — R1312 Dysphagia, oropharyngeal phase: Secondary | ICD-10-CM | POA: Diagnosis not present

## 2017-10-29 DIAGNOSIS — H9191 Unspecified hearing loss, right ear: Secondary | ICD-10-CM | POA: Diagnosis not present

## 2017-10-29 DIAGNOSIS — K222 Esophageal obstruction: Secondary | ICD-10-CM | POA: Diagnosis not present

## 2017-10-30 ENCOUNTER — Other Ambulatory Visit: Payer: Self-pay

## 2017-10-30 MED ORDER — OXYCODONE-ACETAMINOPHEN 5-325 MG PO TABS: 1.0000 | ORAL_TABLET | Freq: Four times a day (QID) | ORAL | 0 refills | Status: DC | PRN

## 2017-10-30 NOTE — Telephone Encounter (Signed)
RX Fax for [facility]@ [phone]  

## 2017-11-02 DIAGNOSIS — K529 Noninfective gastroenteritis and colitis, unspecified: Secondary | ICD-10-CM | POA: Diagnosis not present

## 2017-11-02 DIAGNOSIS — Z931 Gastrostomy status: Secondary | ICD-10-CM | POA: Diagnosis not present

## 2017-11-02 DIAGNOSIS — K52831 Collagenous colitis: Secondary | ICD-10-CM | POA: Diagnosis not present

## 2017-11-03 ENCOUNTER — Encounter: Payer: Self-pay | Admitting: Internal Medicine

## 2017-11-03 ENCOUNTER — Non-Acute Institutional Stay (SKILLED_NURSING_FACILITY): Payer: Medicare Other | Admitting: Internal Medicine

## 2017-11-03 DIAGNOSIS — K52831 Collagenous colitis: Secondary | ICD-10-CM

## 2017-11-03 DIAGNOSIS — E782 Mixed hyperlipidemia: Secondary | ICD-10-CM | POA: Diagnosis not present

## 2017-11-03 DIAGNOSIS — M1000 Idiopathic gout, unspecified site: Secondary | ICD-10-CM | POA: Diagnosis not present

## 2017-11-03 DIAGNOSIS — R531 Weakness: Secondary | ICD-10-CM | POA: Diagnosis not present

## 2017-11-03 DIAGNOSIS — E43 Unspecified severe protein-calorie malnutrition: Secondary | ICD-10-CM

## 2017-11-03 DIAGNOSIS — I10 Essential (primary) hypertension: Secondary | ICD-10-CM

## 2017-11-03 NOTE — Progress Notes (Addendum)
Location:   [facility] Room Number: 159/P Place of Service:  SNF (31)  Provider: Veleta Miners  PCP: [name], MD Patient Care Team: [name], MD as PCP - General (Family Medicine) [name], MD as Consulting Physician (Gastroenterology)  Extended Emergency Contact Information Primary Emergency Contact: Luan Pulling Address: [address]          [place], [place] [address] [name] of [place] Phone: [phone] Mobile Phone: [phone] Relation: Spouse  Code Status: Full Code Goals of care:  Advanced Directive information Advanced Directives 11/03/2017  Does Patient Have a Medical Advance Directive? Yes  Type of Advance Directive (No Data)  Does patient want to make changes to medical advance directive? No - Patient declined     Allergies  Allergen Reactions  . Cefprozil   . Doxycycline Hives and Itching  . Pineapple     Other reaction(s): Bleeding (intolerance) Mouth burns and bleeds  . Zenpep [Pancrelipase (Lip-Prot-Amyl)]     Patient does not recall reaction but states he had to stop because it was "bad". I have listed in allergies, as we are not able to determine what this was.   . Levofloxacin Rash    Unknown REACTION: rash  . Penicillins Rash    REACTION: hives  . Statins Other (See Comments) and Rash    aching aching    Chief Complaint  Patient presents with  . Discharge Note    Discharge Visit    HPI:  81 y.o. male  Seen today for possible discharge in end of this week from SNF to Home with his Wife.  Patient has multiple problems including Collagenous Colitis With Chronic Diarrhea,CAD S/P Stents, HTN, GERD, Gout, Chronic Transaminitis, Hyperlipidemia, Rheumatoid arthritis, Chronic Peripheral Neuropathy, Prostate cancer s/p Prostatectomy, basal cell skin cancer, Oral Lichen Planus  He has been in facility after prolong hospitalization for Weakness and diarrhea . He was in the [facility] from 08/24-09/07.He  had PEG tube placed on 08/26.to supplement his PO intake.  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 ALS was ruled out on Biopsy.  In the Facility patient developed Fever and was transferred to [facility] 09/30. Send back to Facility with no Change in treatment on 10/08. He developed fever in facility and was diagnosed with Pneumonia treated with Bactrim and Azithromycin as he is allergic to many Antibiotics. He also had gout effecting multiple Joints and was treated with Prednisone and Colchicine. His diarrhea resolved with Budesonide. He was seen by ENT and was started on Septra and Diflucan for 3 weeks for Acute Sinusitis.  Since then patient did very well with therapy. He is walking with his walker. He is now talking Puree diet orally and would  Not be  needing any Tube feeds supplement. He will be discharged with his wife who is very involved in his care   Past Medical History:  Diagnosis Date  . Collagenous colitis   . Coronary atherosclerosis of native coronary artery    DES RCA 5/11, LVEF 55%  . Essential hypertension, benign   . GERD (gastroesophageal reflux disease)   . Hiatal hernia   . Mixed hyperlipidemia   . Obstructive sleep apnea   . Prostate cancer ([place])   . Pulmonary nodules   . Rheumatoid arthritis [facility])     Past Surgical History:  Procedure Laterality Date  . APPENDECTOMY    . COLONOSCOPY  06/2016   Dr.  Benson: mild diverticulosis in left colon, sessile polyp 3-7 mm in distal descending colon, biopsies of colon to assess for microscopic colitis. path: collagenous colitis. colon polyp was benign polypoid colorectal mucosa   . COLONOSCOPY  04/2014   Dr. Britta Mccreedy: office notes stated 3 tubular adenomas and diverticulosis of left colon   . CORONARY ANGIOPLASTY WITH STENT PLACEMENT  2011  . ESOPHAGOGASTRODUODENOSCOPY  06/2016   Dr. Britta Mccreedy:  gastritis, path: chronic active gastritis, negative H.pylori   . HEMORRHOID SURGERY    . LAPAROSCOPIC CHOLECYSTECTOMY    . PROSTATECTOMY    . Skin cancer resection        reports that he quit smoking about 25 years ago. His smoking use included cigarettes. He started smoking about 65 years ago. He has a 40.00 pack-year smoking history. he has never used smokeless tobacco. He reports that he drinks alcohol. He reports that he does not use drugs. Social History   Socioeconomic History  . Marital status: Married    Spouse name: Not on file  . Number of children: Not on file  . Years of education: Not on file  . Highest education level: Not on file  Social Needs  . Financial resource strain: Not on file  . Food insecurity - worry: Not on file  . Food insecurity - inability: Not on file  . Transportation needs - medical: Not on file  . Transportation needs - non-medical: Not on file  Occupational History  . Not on file  Tobacco Use  . Smoking status: Former Smoker    Packs/day: 1.00    Years: 40.00    Pack years: 40.00    Types: Cigarettes    Start date: 12/30/1951    Last attempt to quit: 12/30/1991    Years since quitting: 25.8  . Smokeless tobacco: Never Used  Substance and Sexual Activity  . Alcohol use: Yes    Alcohol/week: 0.0 oz    Comment: 1 happy hour beer a day   . Drug use: No  . Sexual activity: Not on file  Other Topics Concern  . Not on file  Social History Narrative  . Not on file   Functional Status Survey:    Allergies  Allergen Reactions  . Cefprozil   . Doxycycline Hives and Itching  . Pineapple     Other reaction(s): Bleeding (intolerance) Mouth burns and bleeds  . Zenpep [Pancrelipase (Lip-Prot-Amyl)]     Patient does not recall reaction but states he had to stop because it was "bad". I have listed in allergies, as we are not able to determine what this was.   . Levofloxacin Rash    Unknown REACTION: rash  . Penicillins Rash    REACTION: hives    . Statins Other (See Comments) and Rash    aching aching    Pertinent  Health Maintenance Due  Topic Date Due  . INFLUENZA VACCINE  11/28/2017 (Originally 07/29/2017)  . PNA vac Low Risk Adult (1 of 2 - PCV13) 11/28/2017 (Originally 11/01/2003)    Medications: Outpatient Encounter Medications as of 11/03/2017  Medication Sig  . acetaminophen (TYLENOL) 325 MG tablet Take 650 mg every 6 (six) hours as needed by mouth.  Marland Kitchen acetaminophen (TYLENOL) 500 MG tablet Take 500 mg by mouth as needed for mild pain or moderate pain.   Marland Kitchen allopurinol (ZYLOPRIM) 100 MG tablet Place 100 mg into feeding tube 2 (two) times daily.  . budesonide (PULMICORT) 0.5 MG/2ML nebulizer solution Take 0.5 mg by nebulization 3 (three)  times daily.  . calcium-vitamin D (OSCAL WITH D) 250-125 MG-UNIT tablet Place 1 tablet into feeding tube daily.  . cevimeline (EVOXAC) 30 MG capsule Place 30 mg into feeding tube 3 (three) times daily. May add 1 tab at bedtime as needed.  . Cholecalciferol 1000 units capsule Place 1,000 Units into feeding tube daily.  . clobetasol (TEMOVATE) 0.05 % GEL Apply topically as needed.  . colchicine 0.6 MG tablet Take 0.6 mg by mouth daily.  . collagenase (SANTYL) ointment Apply 1 application topically daily. Apply to sacral wound  . fluconazole (DIFLUCAN) 100 MG tablet Take 100 mg daily by mouth. Until 11/20/2017  . gabapentin (NEURONTIN) 250 MG/5ML solution Place 600 mg into feeding tube 3 (three) times daily.   Marland Kitchen HYDROcodone-acetaminophen (NORCO) 7.5-325 MG tablet Take 1 tablet every 4 (four) hours as needed by mouth for moderate pain.  . Lactobacillus (LACTINEX) PACK 1 packet; gastric tube. Mix with water  . Magnesium Carbonate (MAGONATE) 54 MG/5ML LIQD Place 54 mg into feeding tube daily.  . nitroGLYCERIN (NITROSTAT) 0.4 MG SL tablet Place 1 tablet (0.4 mg total) under the tongue every 5 (five) minutes as needed.  . NON FORMULARY 150 ml free water by Peg Tube qid. Keep HOB elevated 45 degrees  during all flushes and bolus feeds. Four times a day  . Nutritional Supplements (FEEDING SUPPLEMENT, JEVITY 1.5 CAL,) LIQD Place 200 mLs into feeding tube every 4 (four) hours.   Marland Kitchen oxyCODONE (OXY IR/ROXICODONE) 5 MG immediate release tablet Take 5 mg every 6 (six) hours as needed by mouth for severe pain.  . pantoprazole sodium (PROTONIX) 40 mg/20 mL PACK Place 40 mg into feeding tube 2 (two) times daily.  . predniSONE (DELTASONE) 10 MG tablet Take 10 mg daily with breakfast by mouth. Starting 11/03/2017-11/09/2017  . predniSONE (DELTASONE) 5 MG tablet Take 5 mg daily with breakfast by mouth. Take from 11/10/2017-11/16/2017  . spironolactone (ALDACTONE) 25 MG tablet Take 25 mg by mouth daily.  Marland Kitchen sulfamethoxazole-trimethoprim (BACTRIM,SEPTRA) 200-40 MG/5ML suspension Take 20 mLs 2 (two) times daily by mouth.  . vitamin B-12 (CYANOCOBALAMIN) 1000 MCG tablet Place 1,000 mcg into feeding tube daily.  . [DISCONTINUED] aspirin 81 MG chewable tablet Place 81 mg into feeding tube daily.  . [DISCONTINUED] metoprolol tartrate (LOPRESSOR) 25 MG tablet Take 12.5 mg by mouth 2 (two) times daily.  . [DISCONTINUED] oxyCODONE-acetaminophen (PERCOCET/ROXICET) 5-325 MG tablet Take 1 tablet by mouth every 6 (six) hours as needed for severe pain.   No facility-administered encounter medications on file as of 11/03/2017.      Review of Systems  Vitals:   11/03/17 1119  BP: 122/73  Pulse: 91  Resp: 18  Temp: (!) 97.2 F (36.2 C)  TempSrc: Oral  Weight: 141 lb 4.8 oz (64.1 kg)   Body mass index is 24.25 kg/m. Physical Exam  Constitutional: He is oriented to person, place, and time. He appears well-developed and well-nourished.  HENT:  Head: Normocephalic.  Mouth/Throat: Oropharynx is clear and moist.  Eyes: Pupils are equal, round, and reactive to light.  Neck: Neck supple.  Cardiovascular: Normal rate and normal heart sounds.  Pulmonary/Chest: Effort normal. No respiratory distress. He has no rales.    Abdominal: Soft. Bowel sounds are normal. He exhibits no distension. There is no tenderness. There is no rebound.  Musculoskeletal:  Mild edema Bilateral   Neurological: He is alert and oriented to person, place, and time.  Skin: Skin is warm and dry.  Psychiatric: He has a normal mood  and affect. His behavior is normal. Thought content normal.    Labs reviewed: Basic Metabolic Panel: Recent Labs    09/27/17 1125 10/07/17 0704 10/12/17 0945  NA 133* 138 136  K 4.1 4.0 4.2  CL 102 104 102  CO2 23 27 26   GLUCOSE 207* 111* 91  BUN 15 20 22*  CREATININE 0.59* 0.62 0.65  CALCIUM 7.8* 7.7* 8.3*   Liver Function Tests: Recent Labs    09/27/17 1125 10/07/17 0704 10/12/17 0945  AST 31 29 38  ALT 29 44 53  ALKPHOS 105 92 115  BILITOT 1.1 0.7 0.4  PROT 5.3* 4.9* 6.3*  ALBUMIN 1.9* 1.9* 2.6*   Recent Labs    08/15/17 1206  LIPASE 31   Recent Labs    09/24/17 0750  AMMONIA 37*   CBC: Recent Labs    09/26/17 1425 09/27/17 1125 10/07/17 0704 10/12/17 0945  WBC 7.5 6.9 7.7 8.3  NEUTROABS 3.7 4.0  --  5.4  HGB 11.0* 10.4* 11.0* 12.5*  HCT 34.7* 32.1* 35.3* 39.8  MCV 101.8* 101.3* 102.6* 102.6*  PLT 85* 84* 111* 130*   Cardiac Enzymes: No results for input(s): CKTOTAL, CKMB, CKMBINDEX, TROPONINI in the last 8760 hours. BNP: Invalid input(s): POCBNP CBG: Recent Labs    09/22/17 0329 09/22/17 0511  GLUCAP 66 118*    Procedures and Imaging Studies During Stay: Dg Chest 2 View  Result Date: 10/06/2017 CLINICAL DATA:  History of aspiration. Prostate cancer. Hypertension. Prior history of smoking. EXAM: CHEST  2 VIEW COMPARISON:  09/27/2017.  CT 08/15/2017 . FINDINGS: Mediastinum and hilar structures normal. Changes of bullous COPD. Mild right base infiltrate and right-sided pleural effusion. Small left pleural effusion. No pneumothorax. Diffuse osteopenia degenerative change. Multiple stable compression fractures noted. IMPRESSION: 1.  Bullous COPD. 2. Mild  right base infiltrate with small right pleural effusion. Small left pleural effusion. Electronically Signed   By: Marcello Moores  Register   On: 10/06/2017 14:59   Dg Elbow Complete Left (3+view)  Result Date: 10/06/2017 CLINICAL DATA:  The patient suffered a blow to the left elbow today while walking with onset of pain. Initial encounter. EXAM: LEFT ELBOW - COMPLETE 3+ VIEW COMPARISON:  None. FINDINGS: No fracture or dislocation is seen. Elbow joint effusion is noted. Marked soft tissue swelling posterior to the olecranon is identified. IMPRESSION: Negative for fracture. Marked soft tissue swelling posterior to the olecranon consistent with bursitis. Small elbow joint effusion is also identified. These findings are nonspecific but can be seen in gout. Electronically Signed   By: Inge Rise M.D.   On: 10/06/2017 14:58   Mr Jeri Cos WU Contrast  Result Date: 10/13/2017 CLINICAL DATA:  Impaired gait.  Headache and neck pain EXAM: MRI HEAD WITHOUT AND WITH CONTRAST TECHNIQUE: Multiplanar, multiecho pulse sequences of the brain and surrounding structures were obtained without and with intravenous contrast. CONTRAST:  77mL MULTIHANCE GADOBENATE DIMEGLUMINE 529 MG/ML IV SOLN COMPARISON:  CT head 08/08/2017 FINDINGS: Brain: Generalized atrophy without hydrocephalus. Scattered small white matter hyperintensities, mild. Negative for acute infarct. Negative for hemorrhage, mass. Normal enhancement postcontrast administration. Vascular: Normal arterial flow voids Skull and upper cervical spine: No focal bony lesion. C1-2 arthropathy with prominent pannus posterior to the dens. No compression of the cord. Sinuses/Orbits: Air-fluid level in the maxillary sinus bilaterally. Bilateral cataract removal Other: None IMPRESSION: Atrophy with mild chronic white matter changes. No acute intracranial abnormality C1-2 arthropathy with prominent pannus posterior to the dens. No cord compression. Air-fluid levels in the maxillary  sinus bilaterally. Electronically Signed   By: Franchot Gallo M.D.   On: 10/13/2017 13:06   Mr Cervical Spine W Wo Contrast  Result Date: 10/13/2017 CLINICAL DATA:  Impaired gait. Headache and neck pain. Prostate cancer. EXAM: MRI CERVICAL SPINE WITHOUT AND WITH CONTRAST TECHNIQUE: Multiplanar and multiecho pulse sequences of the cervical spine, to include the craniocervical junction and cervicothoracic junction, were obtained without and with intravenous contrast. CONTRAST:  15 mL MultiHance IV E COMPARISON:  Cervical radiographs 01/14/2010 FINDINGS: Alignment: Mild anterolisthesis C4-5 and mild retrolisthesis C5-6 Vertebrae: Negative for fracture or mass. No evidence of metastatic disease. Cord: Normal cord signal.  No cord lesion. Posterior Fossa, vertebral arteries, paraspinal tissues: Negative Disc levels: C1-2: Arthropathy with prominent pannus posterior to the dens. No cord compression C2-3:  Small central disc protrusion without cord deformity C3-4: Disc degeneration with mild uncinate spurring and mild facet degeneration bilaterally. Mild foraminal narrowing bilaterally C4-5: Disc degeneration and spondylosis. Mild anterior slip. Right-sided facet hypertrophy. Mild spinal stenosis and mild to moderate foraminal stenosis bilaterally C5-6: Mild retrolisthesis. Disc degeneration with diffuse endplate spurring and a small central disc protrusion. Moderate spinal stenosis and moderate left foraminal encroachment C6-7: Small central disc protrusion. No significant spinal or foraminal stenosis C7-T1:  Mild anterior slip.  Negative for stenosis IMPRESSION: C1-2 arthropathy with prominent pannus formation Mild foraminal narrowing bilaterally at C3-4 Mild spinal stenosis and mild to moderate foraminal stenosis bilaterally at C4-5 Moderate spinal stenosis C5-6 with moderate left foraminal encroachment Electronically Signed   By: Franchot Gallo M.D.   On: 10/13/2017 13:12   Dg Op Swallowing Func-medicare/speech  Path  Result Date: 10/13/2017 [place] [address] [place], [place], [address] Phone: [phone]   Fax:  [phone] Modified Barium Swallow Patient Details Name: REED DADY MRN: [id number] Date of Birth: Sep 12, 1936 No Data Recorded Encounter Date: 10/13/2017   End of Session - 10/13/17 1811   Visit Number 1  Number of Visits 1  Authorization Type Medicare  SLP Start Time 1430  SLP Stop Time  1510  SLP Time Calculation (min) 40 min  Activity Tolerance Patient tolerated treatment well  Past Medical History: Diagnosis Date . Collagenous colitis  . Coronary atherosclerosis of native coronary artery   DES RCA 5/11, LVEF 55% . Essential hypertension, benign  . GERD (gastroesophageal reflux disease)  . Hiatal hernia  . Mixed hyperlipidemia  . Obstructive sleep apnea  . Prostate cancer ([place])  . Pulmonary nodules  . Rheumatoid arthritis [facility])  Past Surgical History: Procedure Laterality Date . APPENDECTOMY   . COLONOSCOPY  06/2016  Dr. Britta Mccreedy: mild diverticulosis in left colon, sessile polyp 3-7 mm in distal descending colon, biopsies of colon to assess for microscopic colitis. path: collagenous colitis. colon polyp was benign polypoid colorectal mucosa  . COLONOSCOPY  04/2014  Dr. Britta Mccreedy: office notes stated 3 tubular adenomas and diverticulosis of left colon  . CORONARY ANGIOPLASTY WITH STENT PLACEMENT  2011 . ESOPHAGOGASTRODUODENOSCOPY  06/2016  Dr. Britta Mccreedy: gastritis, path: chronic active gastritis, negative H.pylori  . HEMORRHOID SURGERY   . LAPAROSCOPIC CHOLECYSTECTOMY   . PROSTATECTOMY   . Skin cancer resection   There were no vitals filed for this visit.   Subjective Assessment - 10/13/17 1745   Subjective "It is getting a little better."  Patient is accompained by: Family member  Special Tests MBSS  Currently in Pain? No/denies    General - 10/13/17 1746    General Information  Date of Onset  08/21/17  HPI Jarrad Mclees is a 81 yo male who was referred for MBSS by  Dr. Veleta Miners. Pt is currently at [facility] for rehab following a hospitalization at [facility] in [place]. Mr. Mccalla history is significant for chronic transaminitis, gout, EtOH abuse, collagenous colitis with chronic diarrhea, CAD s/p stents, HTN, GERD, HH, HLD, OSA on CPAP, RA, connective tissue disease, chronic peripheral neuropathy (etiology unclear), prostate cancer s/p prostatectomy, basal cell cancer s/p skin biopsy, oral lichen planus, and uriticaria who presented to [facility] on 8/24 with weakness and weight loss with associated lactic acidosis. He had a diagnostic workup but it was decided that his weakness was likely due to his chronic and uncontrolled diarrhea. He was also found to have severe dysphagia and required PEG tube placement. 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 Chest x-ray on 10/06/2017 shows: Mild right base infiltrate with small right pleural effusion. Small left pleural effusion. MR Brain: Atrophy with mild chronic white matter changes. No acute intracranial abnormality C1-2 arthropathy with prominent pannus posterior to the dens. No cord compression.MR C-spine shows: C1-2 arthropathy with prominent pannus formation, Mild foraminal narrowing bilaterally at C3-4Mild spinal stenosis and mild to moderate foraminal stenosis bilaterally at C4-5 Moderate spinal stenosis C5-6 with moderate left foraminal encroachment. Pt was evaluated by Dr. Ernestine Conrad on Monday and he will have transnasal/esophageal endoscopy balloon dilation on 10/29/2017. The results from that visit are currently unavailable on EPIC, however there is a note from ENT, Dr. Redmond Baseman from 08/20/2017 which noted good glottal closure with symmetrical vocal fold adduction and abduction. Pt's spouse reports that they were told that he had some bowing of his vocal folds after his ENT appointment this past Monday.  Type of  Study MBS-Modified Barium Swallow Study  Previous Swallow Assessment 08/2017 at [facility]- PEG with thins  Diet Prior to this Study PEG tube;Thin liquids  Temperature Spikes Noted No  Respiratory Status Room air  History of Recent Intubation No  Behavior/Cognition Alert;Cooperative;Pleasant mood  Oral Cavity Assessment Within Functional Limits  Oral Care Completed by SLP No  Oral Cavity - Dentition Adequate natural dentition  Vision Functional for self feeding  Self-Feeding Abilities Able to feed self  Patient Positioning Upright in chair  Baseline Vocal Quality Normal;Low vocal intensity  Volitional Cough Strong  Volitional Swallow Able to elicit  Anatomy Within functional limits  Pharyngeal Secretions Not observed secondary MBS    Oral Preparation/Oral Phase - 10/13/17 1753    Oral Preparation/Oral Phase  Oral Phase Impaired   Oral - Thin  Oral - Thin Cup Decreased bolus cohesion;Piecemeal swallowing   Oral - Solids  Oral - Puree Piecemeal swallowing  Oral - Regular Piecemeal swallowing;Oral residue;Delayed A-P transit   Electrical stimulation - Oral Phase  Was Electrical Stimulation Used No    Pharyngeal Phase - 10/13/17 1754    Pharyngeal Phase  Pharyngeal Phase Impaired   Pharyngeal - Nectar  Pharyngeal- Nectar Cup Swallow initiation at pyriform sinus;Reduced epiglottic inversion;Pharyngeal residue - valleculae premature spillage   Pharyngeal - Thin  Pharyngeal- Thin Teaspoon Swallow initiation at vallecula;Reduced epiglottic inversion premature spillage  Pharyngeal- Thin Cup Swallow initiation at vallecula;Reduced epiglottic inversion;Reduced airway/laryngeal closure;Penetration/Aspiration during swallow;Penetration/Apiration after swallow;Trace aspiration;Pharyngeal residue - valleculae premature spillage  Pharyngeal Material does not enter airway;Material enters airway, remains ABOVE vocal cords then ejected out;Material enters airway, passes BELOW cords and not ejected out despite cough attempt by patient    Pharyngeal - Solids  Pharyngeal- Puree Swallow initiation at vallecula;Reduced epiglottic inversion;Reduced tongue base retraction;Pharyngeal residue - valleculae  Pharyngeal- Mechanical Soft Swallow initiation at vallecula;Reduced tongue base retraction;Reduced epiglottic inversion;Pharyngeal residue - valleculae  Pharyngeal- Regular Delayed swallow initiation-vallecula;Reduced epiglottic inversion;Reduced tongue base retraction;Pharyngeal residue - valleculae moderate vallecular residue with regular textures  Pharyngeal- Pill -- trace aspiration of thins when taking pill   Pharyngeal Phase - Comment  Pharyngeal Comment premature spillage from oral cavity, decreased tongue base retraction and epiglottic deflection resulting in vallecular residue   Electrical Stimulation - Pharyngeal Phase  Was Electrical Stimulation Used No    Cricopharyngeal Phase - 10/13/17 1804    Cervical Esophageal Phase  Cervical Esophageal Phase Impaired   Cervical Esophageal Phase - Solids  Puree Prominent cricopharyngeal segment   Cervical Esophageal Phase - Comment  Cervical Esophageal Comment Esophageal sweep was unremarkable  Previous MBSS at [facility] in September: <<Patient presents with mild-moderate oral and moderate-severe to severe pharyngeal dysphagia. In the oral phase, reduced control/coordination and strength as well as missing dentition and xerostomia resulted in prolonged oral transit times, premature spillage and oral residuals. Oral residuals were cleared by a reflexive swallow and/or liquid wash. Pharyngeal initiation of the swallow was delayed. In the pharyngeal phase, reduced base of tongue retraction, pharyngeal constriction, hyolaryngeal excursion, epiglottic inversion and laryngeal vestibule closure was observed. UES with significantly decreased opening in both duration and width. Deficits resulted in severe pharyngeal residuals with thicker viscosities (soft solid, puree, nectar thick) from the valleculae to the  pyriforms (see image below). A double swallow was largely ineffective in clearing significant amount of residuals. Multiple swallows and liquid washes only partially effective in clearing residuals. Residuals eventually resulted in penetration/aspiration after the swallow and/or during the re-swallow via the interarytenoid space. Deficits also resulted in aspiration of thin liquids during the swallow. Strategies attempted were not successful in significantly minimizing or eliminating aspiration. Patient with variable response to aspiration, but reflexive and cued coughing was ineffective in clearing aspiration. Pill trial not attempted due to the severity of pharyngeal deficits. >>   Plan - 10/13/17 1812   Clinical Impression Statement Pt assessed in the lateral position with barium tinged thin (tsp, cup), NTL, puree, mechanical soft, regular textures, and barium tablet with thin. Pt with definite improvement from MBSS listed just above (from [facility]), however mild/moderate oropharyngeal dysphagia persists with oral deficits significant for reduced lingual control with liquid presentations resulting in poor posterior oral containment with premature spillage over BOT into pharynx. Swallow trigger generally at the level of the valleculae (except to the pyriforms with NTL). Pt with decreased tongue base retraction and epiglottic deflection resulting in flash penetration at times with cup sips thin and two episodes of sensed trace aspiration of thins (one with a larger sip and one when taking barium tablet with thin). Weak cough elicited, but aspirate not ejected from anterior tracheal wall. Pt also with mild/mod vallecular residue with mechanical soft textures and moderate with regular textures. Slight prominence in cricopharyngeus noted, however no significant pooling/residue at the UES or in pyriforms (this appears improved per previous report above). Recommend initiating D2/chopped or mech soft (per treating SLP  clinical judgment) and small cup sips of thin with implementation of chin tuck (only use with liquids). Pt also benefited from implementation of effortful or hard swallow with solids (however this may also increase fatigue over the course of meal). Periodic throat clear and repeat swallow beneficial with liquids. Pt has made good progress and would likely benefit from ongoing pharyngeal  exercises and possibly adduction exercises in deemed appropriate per ENT. Pt requested that this report be sent to his ENT, Dr. Ernestine Conrad. Continue aspiration and reflux precautions.   Patient will benefit from skilled therapeutic intervention in order to improve the following deficits and impairments:  Dysphagia, oropharyngeal phase   G-Codes - 10/18/17 1813   Functional Assessment Tool Used MBSS; clinical judgment  Functional Limitations Swallowing  Swallow Current Status (Z6109) At least 20 percent but less than 40 percent impaired, limited or restricted  Swallow Goal Status (U0454) At least 20 percent but less than 40 percent impaired, limited or restricted  Swallow Discharge Status [phone]) At least 20 percent but less than 40 percent impaired, limited or restricted    Recommendations/Treatment - 2017-10-18 1804    Swallow Evaluation Recommendations  SLP Diet Recommendations Dysphagia 2 (chopped);Thin  Liquid Administration via Cup;No straw  Medication Administration Whole meds with puree  Supervision Patient able to self feed;Full supervision/cueing for compensatory strategies  Compensations Slow rate;Small sips/bites;Multiple dry swallows after each bite/sip;Clear throat intermittently;Chin tuck;Effortful swallow chin tuck with liquids  Postural Changes Seated upright at 90 degrees;Remain upright for at least 30 minutes after feeds/meals    Prognosis - 10/18/2017 1808    Prognosis  Prognosis for Safe Diet Advancement Good  Barriers to Reach Goals Time post onset   Individuals Consulted  Consulted and Agree with Results  and Recommendations Patient;Family member/caregiver;Other (Comment)  Family Member Consulted Spouse and treating SLP, Seth Bake  Report Sent to  Referring physician;Facility (Comment) [facility]  Problem List Patient Active Problem List  Diagnosis Date Noted . Delirium 09/29/2017 . Gout 09/22/2017 . Rheumatoid arthritis ([place]) 08/28/2017 . Transaminitis 08/28/2017 . Rapidly progressive weakness 08/21/2017 . Severe protein-calorie malnutrition ([place]) 08/21/2017 . Dysphagia 08/17/2017 . Loss of weight 12/15/2016 . Collagenous colitis 09/15/2016 . Elevated LFTs 09/15/2016 . Malignant neoplasm of prostate ([place]) 07/10/2014 . Small fiber neuropathy 06/28/2014 . History of idiopathic urticaria 11/28/2011 . Mixed hyperlipidemia 05/24/2010 . Essential hypertension, benign 05/24/2010 . CORONARY ATHEROSCLEROSIS NATIVE CORONARY ARTERY 05/24/2010 Thank you, Genene Churn, [place] October 18, 2017, 6:15 PM [place] [address] [place], [place], [address] Phone: [phone]   Fax:  [phone] Name: HAIDER HORNADAY MRN: [id number] Date of Birth: 1936-10-29 CLINICAL DATA:  Dysphagia, has had a feeding tube in since August 2018, has been taking some liquids, past history of coronary artery disease, rheumatoid arthritis, prostate cancer, hypertension, former smoker EXAM: MODIFIED BARIUM SWALLOW TECHNIQUE: Different consistencies of barium were administered orally to the patient by the Speech Pathologist. Imaging of the pharynx was performed in the lateral projection. FLUOROSCOPY TIME:  Fluoroscopy Time:  3 minutes 18 seconds Radiation Exposure Index (if provided by the fluoroscopic device): 19.9 mGy Number of Acquired Spot Images: multiple fluoroscopic screen captures COMPARISON:  None FINDINGS: Thin liquid- with presentation by teaspoon, normal swelling is seen. With cup presentation, premature spillover of contrast is identified with laryngeal penetration and aspiration. A weak  spontaneous cough reflex was seen. Decreased laryngeal penetration occurred with chin tuck maneuver. Additional swallows by cup later in the exam demonstrated persistence of premature spillover, delayed initiation, and flash laryngeal penetration. Nectar thick liquid- initial swallow of nectar consistency showed no abnormalities. A second swallow by cup showed premature spillover and delayed initiation without laryngeal penetration or aspiration. Honey- not evaluated Pure- no laryngeal penetration or aspiration. Vallecular residuals were identified. Decreased vallecular residuals after an effortful swallow. Prior cricopharyngeus muscle noted. Cracker-vallecular residuals. No laryngeal penetration or aspiration.  Pure with cracker- no laryngeal penetration or aspiration Barium tablet - 12.5 mm diameter parapelvic passed from oral cavity to stomach without obstruction. Premature spillover of view thin liquid contrast utilized to swallow the pill, with flash laryngeal penetration aspiration just below the vocal cords. IMPRESSION: Swallowing dysfunction as above. Please refer to the Speech Pathologists report for complete details and recommendations. Electronically Signed   By: Lavonia Dana M.D.   On: 10/13/2017 15:00    Assessment/Plan:   Weakness With Neuropathy. Follows  By Neurology in [place].   Per their recommendations to increase Neurontin to 600 mg TID for his neuropathic pain. He is now walking with the walker. He will be followed by Therapy as out patient.  Acute gout of multiple sites Patient stable now. On colchicine and Prednisone taper. Also on Allopurinol..  Collagenous colitis Patient doing well with No More Diarrhea on Budesonide. Saw GI and they recommended to decrease the dose of Budesonide to BID  Dysphagia With Protein malnutrition Tolerating PO feeds with Precautions. Doing well will stop his tube feed today and supplement with mouth Pateitn would need follow up with Gastro to  plan to remove Tube feeds.  Mild cirrhosis with Ascitics portal hypertension and Low albumin Will Need GI follow up. On Aldactone low dose  Anemia and Thrombocytopenia Most likely due to chronic Disease. Vit B12 normal Acute sinusitis On Bactrim and diflucan per ENT   Patient was noticed to have some cognition impairment by Speech therapy. His MRI of head was unremarkable. They will follow him at home.   Future labs/tests needed:   Lab test ordered for tomorrow CMP,CBC, uric acid level. Patient will need follow with Gertie Fey and Neurology as out patient   Discharge time more then 30 min.

## 2017-11-03 NOTE — Progress Notes (Deleted)
Location:   [facility] Room Number: 159/P Place of Service:  SNF (31) Provider:  Ree Edman, Silvestre Moment, MD  Patient Care Team: [name], MD as PCP - General (Family Medicine) [name], MD as Consulting Physician (Gastroenterology)  Extended Emergency Contact Information Primary Emergency Contact: Luan Pulling Address: [address]          [place], [place] [address] [name] of [place] Phone: [phone] Mobile Phone: [phone] Relation: Spouse  Code Status:  Full Code Goals of care: Advanced Directive information Advanced Directives 11/03/2017  Does Patient Have a Medical Advance Directive? Yes  Type of Advance Directive (No Data)  Does patient want to make changes to medical advance directive? No - Patient declined     No chief complaint on file.   HPI:  Pt is a 81 y.o. male seen today for medical management of chronic diseases.     Past Medical History:  Diagnosis Date  . Collagenous colitis   . Coronary atherosclerosis of native coronary artery    DES RCA 5/11, LVEF 55%  . Essential hypertension, benign   . GERD (gastroesophageal reflux disease)   . Hiatal hernia   . Mixed hyperlipidemia   . Obstructive sleep apnea   . Prostate cancer ([place])   . Pulmonary nodules   . Rheumatoid arthritis [facility])    Past Surgical History:  Procedure Laterality Date  . APPENDECTOMY    . COLONOSCOPY  06/2016   Dr. Britta Mccreedy: mild diverticulosis in left colon, sessile polyp 3-7 mm in distal descending colon, biopsies of colon to assess for microscopic colitis. path: collagenous colitis. colon polyp was benign polypoid colorectal mucosa   . COLONOSCOPY  04/2014   Dr. Britta Mccreedy: office notes stated 3 tubular adenomas and diverticulosis of left colon   . CORONARY ANGIOPLASTY WITH STENT PLACEMENT  2011  . ESOPHAGOGASTRODUODENOSCOPY  06/2016   Dr. Britta Mccreedy: gastritis, path: chronic active gastritis, negative H.pylori   . HEMORRHOID SURGERY      . LAPAROSCOPIC CHOLECYSTECTOMY    . PROSTATECTOMY    . Skin cancer resection      Allergies  Allergen Reactions  . Cefprozil   . Doxycycline Hives and Itching  . Pineapple     Other reaction(s): Bleeding (intolerance) Mouth burns and bleeds  . Zenpep [Pancrelipase (Lip-Prot-Amyl)]     Patient does not recall reaction but states he had to stop because it was "bad". I have listed in allergies, as we are not able to determine what this was.   . Levofloxacin Rash    Unknown REACTION: rash  . Penicillins Rash    REACTION: hives  . Statins Other (See Comments) and Rash    aching aching    Outpatient Encounter Medications as of 11/03/2017  Medication Sig  . acetaminophen (TYLENOL) 325 MG tablet Take 650 mg every 6 (six) hours as needed by mouth.  Marland Kitchen acetaminophen (TYLENOL) 500 MG tablet Take 500 mg by mouth as needed for mild pain or moderate pain.   Marland Kitchen allopurinol (ZYLOPRIM) 100 MG tablet Place 100 mg into feeding tube 2 (two) times daily.  . budesonide (PULMICORT) 0.5 MG/2ML nebulizer solution Take 0.5 mg by nebulization 3 (three) times daily.  . calcium-vitamin D (OSCAL WITH D) 250-125 MG-UNIT tablet Place 1 tablet into feeding tube daily.  . cevimeline (EVOXAC) 30 MG capsule Place 30 mg into feeding tube 3 (three) times daily. May add 1 tab at bedtime as needed.  . Cholecalciferol 1000 units capsule Place  1,000 Units into feeding tube daily.  . clobetasol (TEMOVATE) 0.05 % GEL Apply topically as needed.  . colchicine 0.6 MG tablet Take 0.6 mg by mouth daily.  . collagenase (SANTYL) ointment Apply 1 application topically daily. Apply to sacral wound  . fluconazole (DIFLUCAN) 100 MG tablet Take 100 mg daily by mouth. Until 11/20/2017  . gabapentin (NEURONTIN) 250 MG/5ML solution Place 600 mg into feeding tube 3 (three) times daily.   Marland Kitchen HYDROcodone-acetaminophen (NORCO) 7.5-325 MG tablet Take 1 tablet every 4 (four) hours as needed by mouth for moderate pain.  . Lactobacillus  (LACTINEX) PACK 1 packet; gastric tube. Mix with water  . Magnesium Carbonate (MAGONATE) 54 MG/5ML LIQD Place 54 mg into feeding tube daily.  . nitroGLYCERIN (NITROSTAT) 0.4 MG SL tablet Place 1 tablet (0.4 mg total) under the tongue every 5 (five) minutes as needed.  . NON FORMULARY 150 ml free water by Peg Tube qid. Keep HOB elevated 45 degrees during all flushes and bolus feeds. Four times a day  . Nutritional Supplements (FEEDING SUPPLEMENT, JEVITY 1.5 CAL,) LIQD Place 200 mLs into feeding tube every 4 (four) hours.   Marland Kitchen oxyCODONE (OXY IR/ROXICODONE) 5 MG immediate release tablet Take 5 mg every 6 (six) hours as needed by mouth for severe pain.  . pantoprazole sodium (PROTONIX) 40 mg/20 mL PACK Place 40 mg into feeding tube 2 (two) times daily.  . predniSONE (DELTASONE) 10 MG tablet Take 10 mg daily with breakfast by mouth. Starting 11/03/2017-11/09/2017  . predniSONE (DELTASONE) 5 MG tablet Take 5 mg daily with breakfast by mouth. Take from 11/10/2017-11/16/2017  . spironolactone (ALDACTONE) 25 MG tablet Take 25 mg by mouth daily.  Marland Kitchen sulfamethoxazole-trimethoprim (BACTRIM,SEPTRA) 200-40 MG/5ML suspension Take 20 mLs 2 (two) times daily by mouth.  . vitamin B-12 (CYANOCOBALAMIN) 1000 MCG tablet Place 1,000 mcg into feeding tube daily.  . [DISCONTINUED] aspirin 81 MG chewable tablet Place 81 mg into feeding tube daily.  . [DISCONTINUED] metoprolol tartrate (LOPRESSOR) 25 MG tablet Take 12.5 mg by mouth 2 (two) times daily.  . [DISCONTINUED] oxyCODONE-acetaminophen (PERCOCET/ROXICET) 5-325 MG tablet Take 1 tablet by mouth every 6 (six) hours as needed for severe pain.   No facility-administered encounter medications on file as of 11/03/2017.      Review of Systems  Immunization History  Administered Date(s) Administered  . Influenza-Unspecified 08/29/2014   Pertinent  Health Maintenance Due  Topic Date Due  . INFLUENZA VACCINE  11/28/2017 (Originally 07/29/2017)  . PNA vac Low Risk Adult (1  of 2 - PCV13) 11/28/2017 (Originally 11/01/2003)   Fall Risk  08/26/2016  Falls in the past year? Yes  Comment Emmi Telephone Survey: data to providers prior to load  Number falls in past yr: 1  Comment Emmi Telephone Survey Actual Response = 1  Injury with Fall? Yes   Functional Status Survey:    Vitals:   11/03/17 1119  BP: 122/73  Pulse: 91  Resp: 18  Temp: (!) 97.2 F (36.2 C)  TempSrc: Oral  Weight: 141 lb 4.8 oz (64.1 kg)   Body mass index is 24.25 kg/m. Physical Exam  Labs reviewed: Recent Labs    09/27/17 1125 10/07/17 0704 10/12/17 0945  NA 133* 138 136  K 4.1 4.0 4.2  CL 102 104 102  CO2 23 27 26   GLUCOSE 207* 111* 91  BUN 15 20 22*  CREATININE 0.59* 0.62 0.65  CALCIUM 7.8* 7.7* 8.3*   Recent Labs    09/27/17 1125 10/07/17 0704 10/12/17 0945  AST 31 29 38  ALT 29 44 53  ALKPHOS 105 92 115  BILITOT 1.1 0.7 0.4  PROT 5.3* 4.9* 6.3*  ALBUMIN 1.9* 1.9* 2.6*   Recent Labs    09/26/17 1425 09/27/17 1125 10/07/17 0704 10/12/17 0945  WBC 7.5 6.9 7.7 8.3  NEUTROABS 3.7 4.0  --  5.4  HGB 11.0* 10.4* 11.0* 12.5*  HCT 34.7* 32.1* 35.3* 39.8  MCV 101.8* 101.3* 102.6* 102.6*  PLT 85* 84* 111* 130*   Lab Results  Component Value Date   TSH 0.480 08/15/2017   No results found for: HGBA1C Lab Results  Component Value Date   CHOL  05/08/2010    161        ATP III CLASSIFICATION:  <200     mg/dL   Desirable  200-239  mg/dL   Borderline High  >=240    mg/dL   High          HDL 27 (L) 05/08/2010   LDLCALC  05/08/2010    84        Total Cholesterol/HDL:CHD Risk Coronary Heart Disease Risk Table                     Men   Women  1/2 Average Risk   3.4   3.3  Average Risk       5.0   4.4  2 X Average Risk   9.6   7.1  3 X Average Risk  23.4   11.0        Use the calculated Patient Ratio above and the CHD Risk Table to determine the patient's CHD Risk.        ATP III CLASSIFICATION (LDL):  <100     mg/dL   Optimal  100-129  mg/dL   Near  or Above                    Optimal  130-159  mg/dL   Borderline  160-189  mg/dL   High  >190     mg/dL   Very High   TRIG 248 (H) 05/08/2010   CHOLHDL 6.0 05/08/2010    Significant Diagnostic Results in last 30 days:  Dg Chest 2 View  Result Date: 10/06/2017 CLINICAL DATA:  History of aspiration. Prostate cancer. Hypertension. Prior history of smoking. EXAM: CHEST  2 VIEW COMPARISON:  09/27/2017.  CT 08/15/2017 . FINDINGS: Mediastinum and hilar structures normal. Changes of bullous COPD. Mild right base infiltrate and right-sided pleural effusion. Small left pleural effusion. No pneumothorax. Diffuse osteopenia degenerative change. Multiple stable compression fractures noted. IMPRESSION: 1.  Bullous COPD. 2. Mild right base infiltrate with small right pleural effusion. Small left pleural effusion. Electronically Signed   By: Marcello Moores  Register   On: 10/06/2017 14:59   Dg Elbow Complete Left (3+view)  Result Date: 10/06/2017 CLINICAL DATA:  The patient suffered a blow to the left elbow today while walking with onset of pain. Initial encounter. EXAM: LEFT ELBOW - COMPLETE 3+ VIEW COMPARISON:  None. FINDINGS: No fracture or dislocation is seen. Elbow joint effusion is noted. Marked soft tissue swelling posterior to the olecranon is identified. IMPRESSION: Negative for fracture. Marked soft tissue swelling posterior to the olecranon consistent with bursitis. Small elbow joint effusion is also identified. These findings are nonspecific but can be seen in gout. Electronically Signed   By: Inge Rise M.D.   On: 10/06/2017 14:58   Mr Jeri Cos ZO Contrast  Result Date: 10/13/2017  CLINICAL DATA:  Impaired gait.  Headache and neck pain EXAM: MRI HEAD WITHOUT AND WITH CONTRAST TECHNIQUE: Multiplanar, multiecho pulse sequences of the brain and surrounding structures were obtained without and with intravenous contrast. CONTRAST:  66mL MULTIHANCE GADOBENATE DIMEGLUMINE 529 MG/ML IV SOLN COMPARISON:  CT  head 08/08/2017 FINDINGS: Brain: Generalized atrophy without hydrocephalus. Scattered small white matter hyperintensities, mild. Negative for acute infarct. Negative for hemorrhage, mass. Normal enhancement postcontrast administration. Vascular: Normal arterial flow voids Skull and upper cervical spine: No focal bony lesion. C1-2 arthropathy with prominent pannus posterior to the dens. No compression of the cord. Sinuses/Orbits: Air-fluid level in the maxillary sinus bilaterally. Bilateral cataract removal Other: None IMPRESSION: Atrophy with mild chronic white matter changes. No acute intracranial abnormality C1-2 arthropathy with prominent pannus posterior to the dens. No cord compression. Air-fluid levels in the maxillary sinus bilaterally. Electronically Signed   By: Franchot Gallo M.D.   On: 10/13/2017 13:06   Mr Cervical Spine W Wo Contrast  Result Date: 10/13/2017 CLINICAL DATA:  Impaired gait. Headache and neck pain. Prostate cancer. EXAM: MRI CERVICAL SPINE WITHOUT AND WITH CONTRAST TECHNIQUE: Multiplanar and multiecho pulse sequences of the cervical spine, to include the craniocervical junction and cervicothoracic junction, were obtained without and with intravenous contrast. CONTRAST:  15 mL MultiHance IV E COMPARISON:  Cervical radiographs 01/14/2010 FINDINGS: Alignment: Mild anterolisthesis C4-5 and mild retrolisthesis C5-6 Vertebrae: Negative for fracture or mass. No evidence of metastatic disease. Cord: Normal cord signal.  No cord lesion. Posterior Fossa, vertebral arteries, paraspinal tissues: Negative Disc levels: C1-2: Arthropathy with prominent pannus posterior to the dens. No cord compression C2-3:  Small central disc protrusion without cord deformity C3-4: Disc degeneration with mild uncinate spurring and mild facet degeneration bilaterally. Mild foraminal narrowing bilaterally C4-5: Disc degeneration and spondylosis. Mild anterior slip. Right-sided facet hypertrophy. Mild spinal stenosis  and mild to moderate foraminal stenosis bilaterally C5-6: Mild retrolisthesis. Disc degeneration with diffuse endplate spurring and a small central disc protrusion. Moderate spinal stenosis and moderate left foraminal encroachment C6-7: Small central disc protrusion. No significant spinal or foraminal stenosis C7-T1:  Mild anterior slip.  Negative for stenosis IMPRESSION: C1-2 arthropathy with prominent pannus formation Mild foraminal narrowing bilaterally at C3-4 Mild spinal stenosis and mild to moderate foraminal stenosis bilaterally at C4-5 Moderate spinal stenosis C5-6 with moderate left foraminal encroachment Electronically Signed   By: Franchot Gallo M.D.   On: 10/13/2017 13:12   Dg Op Swallowing Func-medicare/speech Path  Result Date: 10/13/2017 [facility] [address] [place], [place], [address] Phone: [phone]   Fax:  [phone] Modified Barium Swallow Patient Details Name: Kevin Mathis MRN: [id number] Date of Birth: 1936-12-10 No Data Recorded Encounter Date: 10/13/2017   End of Session - 10/13/17 1811   Visit Number 1  Number of Visits 1  Authorization Type Medicare  SLP Start Time 1430  SLP Stop Time  1510  SLP Time Calculation (min) 40 min  Activity Tolerance Patient tolerated treatment well  Past Medical History: Diagnosis Date . Collagenous colitis  . Coronary atherosclerosis of native coronary artery   DES RCA 5/11, LVEF 55% . Essential hypertension, benign  . GERD (gastroesophageal reflux disease)  . Hiatal hernia  . Mixed hyperlipidemia  . Obstructive sleep apnea  . Prostate cancer ([place])  . Pulmonary nodules  . Rheumatoid arthritis [facility])  Past Surgical History: Procedure Laterality Date . APPENDECTOMY   . COLONOSCOPY  06/2016  Dr. Britta Mccreedy: mild diverticulosis in left colon,  sessile polyp 3-7 mm in distal descending colon, biopsies of colon to assess for microscopic colitis. path: collagenous colitis. colon polyp was benign polypoid colorectal  mucosa  . COLONOSCOPY  04/2014  Dr. Britta Mccreedy: office notes stated 3 tubular adenomas and diverticulosis of left colon  . CORONARY ANGIOPLASTY WITH STENT PLACEMENT  2011 . ESOPHAGOGASTRODUODENOSCOPY  06/2016  Dr. Britta Mccreedy: gastritis, path: chronic active gastritis, negative H.pylori  . HEMORRHOID SURGERY   . LAPAROSCOPIC CHOLECYSTECTOMY   . PROSTATECTOMY   . Skin cancer resection   There were no vitals filed for this visit.   Subjective Assessment - 10/13/17 1745   Subjective "It is getting a little better."  Patient is accompained by: Family member  Special Tests MBSS  Currently in Pain? No/denies    General - 10/13/17 1746    General Information  Date of Onset 08/21/17  HPI Kevin Mathis is a 81 yo male who was referred for MBSS by Dr. Veleta Miners. Pt is currently at [facility] for rehab following a hospitalization at [facility] in [place]. Mr. Crissman history is significant for chronic transaminitis, gout, EtOH abuse, collagenous colitis with chronic diarrhea, CAD s/p stents, HTN, GERD, HH, HLD, OSA on CPAP, RA, connective tissue disease, chronic peripheral neuropathy (etiology unclear), prostate cancer s/p prostatectomy, basal cell cancer s/p skin biopsy, oral lichen planus, and uriticaria who presented to [facility] on 8/24 with weakness and weight loss with associated lactic acidosis. He had a diagnostic workup but it was decided that his weakness was likely due to his chronic and uncontrolled diarrhea. He was also found to have severe dysphagia and required PEG tube placement. 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 Chest x-ray on 10/06/2017 shows: Mild right base infiltrate with small right pleural effusion. Small left pleural effusion. MR Brain: Atrophy with mild chronic white matter changes. No acute intracranial abnormality C1-2 arthropathy with prominent pannus posterior to the dens. No cord compression.MR C-spine  shows: C1-2 arthropathy with prominent pannus formation, Mild foraminal narrowing bilaterally at C3-4Mild spinal stenosis and mild to moderate foraminal stenosis bilaterally at C4-5 Moderate spinal stenosis C5-6 with moderate left foraminal encroachment. Pt was evaluated by Dr. Ernestine Conrad on Monday and he will have transnasal/esophageal endoscopy balloon dilation on 10/29/2017. The results from that visit are currently unavailable on EPIC, however there is a note from ENT, Dr. Redmond Baseman from 08/20/2017 which noted good glottal closure with symmetrical vocal fold adduction and abduction. Pt's spouse reports that they were told that he had some bowing of his vocal folds after his ENT appointment this past Monday.  Type of Study MBS-Modified Barium Swallow Study  Previous Swallow Assessment 08/2017 at [facility]- PEG with thins  Diet Prior to this Study PEG tube;Thin liquids  Temperature Spikes Noted No  Respiratory Status Room air  History of Recent Intubation No  Behavior/Cognition Alert;Cooperative;Pleasant mood  Oral Cavity Assessment Within Functional Limits  Oral Care Completed by SLP No  Oral Cavity - Dentition Adequate natural dentition  Vision Functional for self feeding  Self-Feeding Abilities Able to feed self  Patient Positioning Upright in chair  Baseline Vocal Quality Normal;Low vocal intensity  Volitional Cough Strong  Volitional Swallow Able to elicit  Anatomy Within functional limits  Pharyngeal Secretions Not observed secondary MBS    Oral Preparation/Oral Phase - 10/13/17 1753    Oral Preparation/Oral Phase  Oral Phase Impaired   Oral - Thin  Oral - Thin Cup Decreased bolus cohesion;Piecemeal swallowing  Oral - Solids  Oral - Puree Piecemeal swallowing  Oral - Regular Piecemeal swallowing;Oral residue;Delayed A-P transit   Electrical stimulation - Oral Phase  Was Electrical Stimulation Used No    Pharyngeal Phase - 10/13/17 1754    Pharyngeal Phase  Pharyngeal Phase Impaired   Pharyngeal - Nectar   Pharyngeal- Nectar Cup Swallow initiation at pyriform sinus;Reduced epiglottic inversion;Pharyngeal residue - valleculae premature spillage   Pharyngeal - Thin  Pharyngeal- Thin Teaspoon Swallow initiation at vallecula;Reduced epiglottic inversion premature spillage  Pharyngeal- Thin Cup Swallow initiation at vallecula;Reduced epiglottic inversion;Reduced airway/laryngeal closure;Penetration/Aspiration during swallow;Penetration/Apiration after swallow;Trace aspiration;Pharyngeal residue - valleculae premature spillage  Pharyngeal Material does not enter airway;Material enters airway, remains ABOVE vocal cords then ejected out;Material enters airway, passes BELOW cords and not ejected out despite cough attempt by patient   Pharyngeal - Solids  Pharyngeal- Puree Swallow initiation at vallecula;Reduced epiglottic inversion;Reduced tongue base retraction;Pharyngeal residue - valleculae  Pharyngeal- Mechanical Soft Swallow initiation at vallecula;Reduced tongue base retraction;Reduced epiglottic inversion;Pharyngeal residue - valleculae  Pharyngeal- Regular Delayed swallow initiation-vallecula;Reduced epiglottic inversion;Reduced tongue base retraction;Pharyngeal residue - valleculae moderate vallecular residue with regular textures  Pharyngeal- Pill -- trace aspiration of thins when taking pill   Pharyngeal Phase - Comment  Pharyngeal Comment premature spillage from oral cavity, decreased tongue base retraction and epiglottic deflection resulting in vallecular residue   Electrical Stimulation - Pharyngeal Phase  Was Electrical Stimulation Used No    Cricopharyngeal Phase - 10/13/17 1804    Cervical Esophageal Phase  Cervical Esophageal Phase Impaired   Cervical Esophageal Phase - Solids  Puree Prominent cricopharyngeal segment   Cervical Esophageal Phase - Comment  Cervical Esophageal Comment Esophageal sweep was unremarkable  Previous MBSS at [facility] in September: <<Patient presents with mild-moderate oral and  moderate-severe to severe pharyngeal dysphagia. In the oral phase, reduced control/coordination and strength as well as missing dentition and xerostomia resulted in prolonged oral transit times, premature spillage and oral residuals. Oral residuals were cleared by a reflexive swallow and/or liquid wash. Pharyngeal initiation of the swallow was delayed. In the pharyngeal phase, reduced base of tongue retraction, pharyngeal constriction, hyolaryngeal excursion, epiglottic inversion and laryngeal vestibule closure was observed. UES with significantly decreased opening in both duration and width. Deficits resulted in severe pharyngeal residuals with thicker viscosities (soft solid, puree, nectar thick) from the valleculae to the pyriforms (see image below). A double swallow was largely ineffective in clearing significant amount of residuals. Multiple swallows and liquid washes only partially effective in clearing residuals. Residuals eventually resulted in penetration/aspiration after the swallow and/or during the re-swallow via the interarytenoid space. Deficits also resulted in aspiration of thin liquids during the swallow. Strategies attempted were not successful in significantly minimizing or eliminating aspiration. Patient with variable response to aspiration, but reflexive and cued coughing was ineffective in clearing aspiration. Pill trial not attempted due to the severity of pharyngeal deficits. >>   Plan - 10/13/17 1812   Clinical Impression Statement Pt assessed in the lateral position with barium tinged thin (tsp, cup), NTL, puree, mechanical soft, regular textures, and barium tablet with thin. Pt with definite improvement from MBSS listed just above (from [facility]), however mild/moderate oropharyngeal dysphagia persists with oral deficits significant for reduced lingual control with liquid presentations resulting in poor posterior oral containment with premature spillage over BOT into pharynx. Swallow trigger  generally at the level of the valleculae (except to the pyriforms with NTL). Pt with decreased tongue base retraction and epiglottic deflection resulting in flash penetration at  times with cup sips thin and two episodes of sensed trace aspiration of thins (one with a larger sip and one when taking barium tablet with thin). Weak cough elicited, but aspirate not ejected from anterior tracheal wall. Pt also with mild/mod vallecular residue with mechanical soft textures and moderate with regular textures. Slight prominence in cricopharyngeus noted, however no significant pooling/residue at the UES or in pyriforms (this appears improved per previous report above). Recommend initiating D2/chopped or mech soft (per treating SLP clinical judgment) and small cup sips of thin with implementation of chin tuck (only use with liquids). Pt also benefited from implementation of effortful or hard swallow with solids (however this may also increase fatigue over the course of meal). Periodic throat clear and repeat swallow beneficial with liquids. Pt has made good progress and would likely benefit from ongoing pharyngeal exercises and possibly adduction exercises in deemed appropriate per ENT. Pt requested that this report be sent to his ENT, Dr. Ernestine Conrad. Continue aspiration and reflux precautions.   Patient will benefit from skilled therapeutic intervention in order to improve the following deficits and impairments:  Dysphagia, oropharyngeal phase   G-Codes - 01-Nov-2017 1813   Functional Assessment Tool Used MBSS; clinical judgment  Functional Limitations Swallowing  Swallow Current Status (A6301) At least 20 percent but less than 40 percent impaired, limited or restricted  Swallow Goal Status (S0109) At least 20 percent but less than 40 percent impaired, limited or restricted  Swallow Discharge Status [phone]) At least 20 percent but less than 40 percent impaired, limited or restricted    Recommendations/Treatment -  11/01/2017 1804    Swallow Evaluation Recommendations  SLP Diet Recommendations Dysphagia 2 (chopped);Thin  Liquid Administration via Cup;No straw  Medication Administration Whole meds with puree  Supervision Patient able to self feed;Full supervision/cueing for compensatory strategies  Compensations Slow rate;Small sips/bites;Multiple dry swallows after each bite/sip;Clear throat intermittently;Chin tuck;Effortful swallow chin tuck with liquids  Postural Changes Seated upright at 90 degrees;Remain upright for at least 30 minutes after feeds/meals    Prognosis - 11-01-17 1808    Prognosis  Prognosis for Safe Diet Advancement Good  Barriers to Reach Goals Time post onset   Individuals Consulted  Consulted and Agree with Results and Recommendations Patient;Family member/caregiver;Other (Comment)  Family Member Consulted Spouse and treating SLP, Seth Bake  Report Sent to  Referring physician;Facility (Comment) [facility]  Problem List Patient Active Problem List  Diagnosis Date Noted . Delirium 09/29/2017 . Gout 09/22/2017 . Rheumatoid arthritis ([place]) 08/28/2017 . Transaminitis 08/28/2017 . Rapidly progressive weakness 08/21/2017 . Severe protein-calorie malnutrition ([place]) 08/21/2017 . Dysphagia 08/17/2017 . Loss of weight 12/15/2016 . Collagenous colitis 09/15/2016 . Elevated LFTs 09/15/2016 . Malignant neoplasm of prostate ([place]) 07/10/2014 . Small fiber neuropathy 06/28/2014 . History of idiopathic urticaria 11/28/2011 . Mixed hyperlipidemia 05/24/2010 . Essential hypertension, benign 05/24/2010 . CORONARY ATHEROSCLEROSIS NATIVE CORONARY ARTERY 05/24/2010 Thank you, Genene Churn, [place] 11-01-2017, 6:15 PM [place] [address] [place], [place], [address] Phone: [phone]   Fax:  [phone] Name: Kevin Mathis MRN: [id number] Date of Birth: 07/20/1936 CLINICAL DATA:  Dysphagia, has had a feeding tube in since August 2018, has been taking some liquids,  past history of coronary artery disease, rheumatoid arthritis, prostate cancer, hypertension, former smoker EXAM: MODIFIED BARIUM SWALLOW TECHNIQUE: Different consistencies of barium were administered orally to the patient by the Speech Pathologist. Imaging of the pharynx was performed in the lateral projection. FLUOROSCOPY TIME:  Fluoroscopy Time:  3 minutes 18 seconds Radiation Exposure Index (if provided by the fluoroscopic device): 19.9 mGy Number of Acquired Spot Images: multiple fluoroscopic screen captures COMPARISON:  None FINDINGS: Thin liquid- with presentation by teaspoon, normal swelling is seen. With cup presentation, premature spillover of contrast is identified with laryngeal penetration and aspiration. A weak spontaneous cough reflex was seen. Decreased laryngeal penetration occurred with chin tuck maneuver. Additional swallows by cup later in the exam demonstrated persistence of premature spillover, delayed initiation, and flash laryngeal penetration. Nectar thick liquid- initial swallow of nectar consistency showed no abnormalities. A second swallow by cup showed premature spillover and delayed initiation without laryngeal penetration or aspiration. Honey- not evaluated Pure- no laryngeal penetration or aspiration. Vallecular residuals were identified. Decreased vallecular residuals after an effortful swallow. Prior cricopharyngeus muscle noted. Cracker-vallecular residuals. No laryngeal penetration or aspiration. Pure with cracker- no laryngeal penetration or aspiration Barium tablet - 12.5 mm diameter parapelvic passed from oral cavity to stomach without obstruction. Premature spillover of view thin liquid contrast utilized to swallow the pill, with flash laryngeal penetration aspiration just below the vocal cords. IMPRESSION: Swallowing dysfunction as above. Please refer to the Speech Pathologists report for complete details and recommendations. Electronically Signed   By: Lavonia Dana M.D.    On: 10/13/2017 15:00    Assessment/Plan There are no diagnoses linked to this encounter.   Family/ staff Communication:   Labs/tests ordered:

## 2017-11-04 ENCOUNTER — Encounter (HOSPITAL_COMMUNITY)
Admission: RE | Admit: 2017-11-04 | Discharge: 2017-11-04 | Disposition: A | Discharge status: Home / Self Care | Payer: Medicare Other | Source: Skilled Nursing Facility | Attending: Internal Medicine | Admitting: Internal Medicine

## 2017-11-04 DIAGNOSIS — M10022 Idiopathic gout, left elbow: Secondary | ICD-10-CM | POA: Insufficient documentation

## 2017-11-04 DIAGNOSIS — Z5189 Encounter for other specified aftercare: Secondary | ICD-10-CM | POA: Insufficient documentation

## 2017-11-04 DIAGNOSIS — M6281 Muscle weakness (generalized): Secondary | ICD-10-CM | POA: Insufficient documentation

## 2017-11-04 DIAGNOSIS — K52831 Collagenous colitis: Secondary | ICD-10-CM | POA: Insufficient documentation

## 2017-11-04 DIAGNOSIS — J90 Pleural effusion, not elsewhere classified: Secondary | ICD-10-CM | POA: Insufficient documentation

## 2017-11-04 LAB — COMPREHENSIVE METABOLIC PANEL
ALBUMIN: 2.7 g/dL — AB (ref 3.5–5.0)
ALK PHOS: 81 U/L (ref 38–126)
ALT: 37 U/L (ref 17–63)
ANION GAP: 8 (ref 5–15)
AST: 31 U/L (ref 15–41)
BILIRUBIN TOTAL: 0.5 mg/dL (ref 0.3–1.2)
BUN: 29 mg/dL — ABNORMAL HIGH (ref 6–20)
CALCIUM: 8.7 mg/dL — AB (ref 8.9–10.3)
CO2: 28 mmol/L (ref 22–32)
CREATININE: 0.83 mg/dL (ref 0.61–1.24)
Chloride: 100 mmol/L — ABNORMAL LOW (ref 101–111)
GFR calc Af Amer: >60 mL/min (ref >60)
GFR calc non Af Amer: >60 mL/min (ref >60)
GLUCOSE: 93 mg/dL (ref 65–99)
Potassium: 4.1 mmol/L (ref 3.5–5.1)
Sodium: 136 mmol/L (ref 135–145)
TOTAL PROTEIN: 6.2 g/dL — AB (ref 6.5–8.1)

## 2017-11-04 LAB — URIC ACID: Uric Acid, Serum: 4.3 mg/dL — ABNORMAL LOW (ref 4.4–7.6)

## 2017-11-04 LAB — CBC
HEMATOCRIT: 39.1 % (ref 39.0–52.0)
HEMOGLOBIN: 12.4 g/dL — AB (ref 13.0–17.0)
MCH: 31.2 pg (ref 26.0–34.0)
MCHC: 31.7 g/dL (ref 30.0–36.0)
MCV: 98.5 fL (ref 78.0–100.0)
Platelets: 106 10*3/uL — ABNORMAL LOW (ref 150–400)
RBC: 3.97 MIL/uL — ABNORMAL LOW (ref 4.22–5.81)
RDW: 16.4 % — ABNORMAL HIGH (ref 11.5–15.5)
WBC: 9.4 10*3/uL (ref 4.0–10.5)

## 2017-11-05 DIAGNOSIS — G729 Myopathy, unspecified: Secondary | ICD-10-CM | POA: Diagnosis not present

## 2017-11-05 DIAGNOSIS — G629 Polyneuropathy, unspecified: Secondary | ICD-10-CM | POA: Diagnosis not present

## 2017-11-05 DIAGNOSIS — R1310 Dysphagia, unspecified: Secondary | ICD-10-CM | POA: Diagnosis not present

## 2017-11-05 DIAGNOSIS — R531 Weakness: Secondary | ICD-10-CM | POA: Diagnosis not present

## 2017-11-05 DIAGNOSIS — R94131 Abnormal electromyogram [EMG]: Secondary | ICD-10-CM | POA: Diagnosis not present

## 2017-11-07 DIAGNOSIS — I2510 Atherosclerotic heart disease of native coronary artery without angina pectoris: Secondary | ICD-10-CM | POA: Diagnosis not present

## 2017-11-07 DIAGNOSIS — Z87891 Personal history of nicotine dependence: Secondary | ICD-10-CM | POA: Diagnosis not present

## 2017-11-07 DIAGNOSIS — Z85828 Personal history of other malignant neoplasm of skin: Secondary | ICD-10-CM | POA: Diagnosis not present

## 2017-11-07 DIAGNOSIS — D649 Anemia, unspecified: Secondary | ICD-10-CM | POA: Diagnosis not present

## 2017-11-07 DIAGNOSIS — K766 Portal hypertension: Secondary | ICD-10-CM | POA: Diagnosis not present

## 2017-11-07 DIAGNOSIS — K52831 Collagenous colitis: Secondary | ICD-10-CM | POA: Diagnosis not present

## 2017-11-07 DIAGNOSIS — D696 Thrombocytopenia, unspecified: Secondary | ICD-10-CM | POA: Diagnosis not present

## 2017-11-07 DIAGNOSIS — G629 Polyneuropathy, unspecified: Secondary | ICD-10-CM | POA: Diagnosis not present

## 2017-11-07 DIAGNOSIS — R1310 Dysphagia, unspecified: Secondary | ICD-10-CM | POA: Diagnosis not present

## 2017-11-07 DIAGNOSIS — Z955 Presence of coronary angioplasty implant and graft: Secondary | ICD-10-CM | POA: Diagnosis not present

## 2017-11-07 DIAGNOSIS — M109 Gout, unspecified: Secondary | ICD-10-CM | POA: Diagnosis not present

## 2017-11-07 DIAGNOSIS — G4733 Obstructive sleep apnea (adult) (pediatric): Secondary | ICD-10-CM | POA: Diagnosis not present

## 2017-11-07 DIAGNOSIS — R188 Other ascites: Secondary | ICD-10-CM | POA: Diagnosis not present

## 2017-11-07 DIAGNOSIS — Z8546 Personal history of malignant neoplasm of prostate: Secondary | ICD-10-CM | POA: Diagnosis not present

## 2017-11-07 DIAGNOSIS — I10 Essential (primary) hypertension: Secondary | ICD-10-CM | POA: Diagnosis not present

## 2017-11-07 DIAGNOSIS — L89322 Pressure ulcer of left buttock, stage 2: Secondary | ICD-10-CM | POA: Diagnosis not present

## 2017-11-07 DIAGNOSIS — K7460 Unspecified cirrhosis of liver: Secondary | ICD-10-CM | POA: Diagnosis not present

## 2017-11-07 DIAGNOSIS — M069 Rheumatoid arthritis, unspecified: Secondary | ICD-10-CM | POA: Diagnosis not present

## 2017-11-07 DIAGNOSIS — L439 Lichen planus, unspecified: Secondary | ICD-10-CM | POA: Diagnosis not present

## 2017-11-07 DIAGNOSIS — Z9079 Acquired absence of other genital organ(s): Secondary | ICD-10-CM | POA: Diagnosis not present

## 2017-11-09 DIAGNOSIS — R1310 Dysphagia, unspecified: Secondary | ICD-10-CM | POA: Diagnosis not present

## 2017-11-09 DIAGNOSIS — K766 Portal hypertension: Secondary | ICD-10-CM | POA: Diagnosis not present

## 2017-11-09 DIAGNOSIS — L89322 Pressure ulcer of left buttock, stage 2: Secondary | ICD-10-CM | POA: Diagnosis not present

## 2017-11-09 DIAGNOSIS — K52831 Collagenous colitis: Secondary | ICD-10-CM | POA: Diagnosis not present

## 2017-11-09 DIAGNOSIS — K7460 Unspecified cirrhosis of liver: Secondary | ICD-10-CM | POA: Diagnosis not present

## 2017-11-09 DIAGNOSIS — G629 Polyneuropathy, unspecified: Secondary | ICD-10-CM | POA: Diagnosis not present

## 2017-11-09 NOTE — Progress Notes (Signed)
This encounter was created in error - please disregard.

## 2017-11-10 DIAGNOSIS — K7460 Unspecified cirrhosis of liver: Secondary | ICD-10-CM | POA: Diagnosis not present

## 2017-11-10 DIAGNOSIS — G629 Polyneuropathy, unspecified: Secondary | ICD-10-CM | POA: Diagnosis not present

## 2017-11-10 DIAGNOSIS — L89322 Pressure ulcer of left buttock, stage 2: Secondary | ICD-10-CM | POA: Diagnosis not present

## 2017-11-10 DIAGNOSIS — K766 Portal hypertension: Secondary | ICD-10-CM | POA: Diagnosis not present

## 2017-11-10 DIAGNOSIS — K52831 Collagenous colitis: Secondary | ICD-10-CM | POA: Diagnosis not present

## 2017-11-10 DIAGNOSIS — R1310 Dysphagia, unspecified: Secondary | ICD-10-CM | POA: Diagnosis not present

## 2017-11-11 DIAGNOSIS — L89322 Pressure ulcer of left buttock, stage 2: Secondary | ICD-10-CM | POA: Diagnosis not present

## 2017-11-11 DIAGNOSIS — R1310 Dysphagia, unspecified: Secondary | ICD-10-CM | POA: Diagnosis not present

## 2017-11-11 DIAGNOSIS — G629 Polyneuropathy, unspecified: Secondary | ICD-10-CM | POA: Diagnosis not present

## 2017-11-11 DIAGNOSIS — K52831 Collagenous colitis: Secondary | ICD-10-CM | POA: Diagnosis not present

## 2017-11-11 DIAGNOSIS — K766 Portal hypertension: Secondary | ICD-10-CM | POA: Diagnosis not present

## 2017-11-11 DIAGNOSIS — K7460 Unspecified cirrhosis of liver: Secondary | ICD-10-CM | POA: Diagnosis not present

## 2017-11-12 DIAGNOSIS — S79912A Unspecified injury of left hip, initial encounter: Secondary | ICD-10-CM | POA: Diagnosis not present

## 2017-11-12 DIAGNOSIS — R2689 Other abnormalities of gait and mobility: Secondary | ICD-10-CM | POA: Diagnosis not present

## 2017-11-12 DIAGNOSIS — Y998 Other external cause status: Secondary | ICD-10-CM | POA: Diagnosis not present

## 2017-11-12 DIAGNOSIS — M109 Gout, unspecified: Secondary | ICD-10-CM | POA: Diagnosis not present

## 2017-11-12 DIAGNOSIS — I10 Essential (primary) hypertension: Secondary | ICD-10-CM | POA: Diagnosis not present

## 2017-11-12 DIAGNOSIS — G629 Polyneuropathy, unspecified: Secondary | ICD-10-CM | POA: Diagnosis not present

## 2017-11-12 DIAGNOSIS — S72112A Displaced fracture of greater trochanter of left femur, initial encounter for closed fracture: Secondary | ICD-10-CM | POA: Diagnosis not present

## 2017-11-12 DIAGNOSIS — G4733 Obstructive sleep apnea (adult) (pediatric): Secondary | ICD-10-CM | POA: Diagnosis not present

## 2017-11-12 DIAGNOSIS — R0902 Hypoxemia: Secondary | ICD-10-CM | POA: Diagnosis not present

## 2017-11-12 DIAGNOSIS — I2510 Atherosclerotic heart disease of native coronary artery without angina pectoris: Secondary | ICD-10-CM | POA: Diagnosis not present

## 2017-11-12 DIAGNOSIS — S32502A Unspecified fracture of left pubis, initial encounter for closed fracture: Secondary | ICD-10-CM | POA: Diagnosis not present

## 2017-11-12 DIAGNOSIS — S3992XA Unspecified injury of lower back, initial encounter: Secondary | ICD-10-CM | POA: Diagnosis not present

## 2017-11-12 DIAGNOSIS — T148XXA Other injury of unspecified body region, initial encounter: Secondary | ICD-10-CM | POA: Diagnosis not present

## 2017-11-12 DIAGNOSIS — S329XXA Fracture of unspecified parts of lumbosacral spine and pelvis, initial encounter for closed fracture: Secondary | ICD-10-CM | POA: Diagnosis not present

## 2017-11-12 DIAGNOSIS — B3781 Candidal esophagitis: Secondary | ICD-10-CM | POA: Diagnosis not present

## 2017-11-12 DIAGNOSIS — W1839XA Other fall on same level, initial encounter: Secondary | ICD-10-CM | POA: Diagnosis not present

## 2017-11-12 DIAGNOSIS — S32039A Unspecified fracture of third lumbar vertebra, initial encounter for closed fracture: Secondary | ICD-10-CM | POA: Diagnosis not present

## 2017-11-12 DIAGNOSIS — E43 Unspecified severe protein-calorie malnutrition: Secondary | ICD-10-CM | POA: Diagnosis not present

## 2017-11-12 DIAGNOSIS — S72115A Nondisplaced fracture of greater trochanter of left femur, initial encounter for closed fracture: Secondary | ICD-10-CM | POA: Diagnosis not present

## 2017-11-12 DIAGNOSIS — M545 Low back pain: Secondary | ICD-10-CM | POA: Diagnosis not present

## 2017-11-12 DIAGNOSIS — Z9989 Dependence on other enabling machines and devices: Secondary | ICD-10-CM | POA: Diagnosis not present

## 2017-11-12 DIAGNOSIS — Z7982 Long term (current) use of aspirin: Secondary | ICD-10-CM | POA: Diagnosis not present

## 2017-11-12 DIAGNOSIS — S299XXA Unspecified injury of thorax, initial encounter: Secondary | ICD-10-CM | POA: Diagnosis not present

## 2017-11-12 DIAGNOSIS — K52831 Collagenous colitis: Secondary | ICD-10-CM | POA: Diagnosis not present

## 2017-11-12 DIAGNOSIS — Z8546 Personal history of malignant neoplasm of prostate: Secondary | ICD-10-CM | POA: Diagnosis not present

## 2017-11-12 DIAGNOSIS — S32512A Fracture of superior rim of left pubis, initial encounter for closed fracture: Secondary | ICD-10-CM | POA: Diagnosis not present

## 2017-11-12 DIAGNOSIS — M549 Dorsalgia, unspecified: Secondary | ICD-10-CM | POA: Diagnosis not present

## 2017-11-12 DIAGNOSIS — M25552 Pain in left hip: Secondary | ICD-10-CM | POA: Diagnosis not present

## 2017-11-12 DIAGNOSIS — W19XXXA Unspecified fall, initial encounter: Secondary | ICD-10-CM | POA: Diagnosis not present

## 2017-11-12 DIAGNOSIS — K7460 Unspecified cirrhosis of liver: Secondary | ICD-10-CM | POA: Diagnosis not present

## 2017-11-12 DIAGNOSIS — R55 Syncope and collapse: Secondary | ICD-10-CM | POA: Diagnosis not present

## 2017-11-12 DIAGNOSIS — L89312 Pressure ulcer of right buttock, stage 2: Secondary | ICD-10-CM | POA: Diagnosis not present

## 2017-11-12 DIAGNOSIS — K219 Gastro-esophageal reflux disease without esophagitis: Secondary | ICD-10-CM | POA: Diagnosis not present

## 2017-11-12 DIAGNOSIS — Z79899 Other long term (current) drug therapy: Secondary | ICD-10-CM | POA: Diagnosis not present

## 2017-11-12 DIAGNOSIS — D61818 Other pancytopenia: Secondary | ICD-10-CM | POA: Diagnosis not present

## 2017-11-12 DIAGNOSIS — S51812A Laceration without foreign body of left forearm, initial encounter: Secondary | ICD-10-CM | POA: Diagnosis not present

## 2017-11-12 DIAGNOSIS — S32592A Other specified fracture of left pubis, initial encounter for closed fracture: Secondary | ICD-10-CM | POA: Diagnosis not present

## 2017-11-13 DIAGNOSIS — G629 Polyneuropathy, unspecified: Secondary | ICD-10-CM | POA: Diagnosis not present

## 2017-11-13 DIAGNOSIS — G4733 Obstructive sleep apnea (adult) (pediatric): Secondary | ICD-10-CM | POA: Diagnosis not present

## 2017-11-13 DIAGNOSIS — K52831 Collagenous colitis: Secondary | ICD-10-CM | POA: Diagnosis not present

## 2017-11-13 DIAGNOSIS — S32039A Unspecified fracture of third lumbar vertebra, initial encounter for closed fracture: Secondary | ICD-10-CM | POA: Diagnosis not present

## 2017-11-13 DIAGNOSIS — S32502A Unspecified fracture of left pubis, initial encounter for closed fracture: Secondary | ICD-10-CM | POA: Diagnosis not present

## 2017-11-13 DIAGNOSIS — E43 Unspecified severe protein-calorie malnutrition: Secondary | ICD-10-CM | POA: Diagnosis not present

## 2017-11-13 DIAGNOSIS — M109 Gout, unspecified: Secondary | ICD-10-CM | POA: Diagnosis not present

## 2017-11-13 DIAGNOSIS — I2510 Atherosclerotic heart disease of native coronary artery without angina pectoris: Secondary | ICD-10-CM | POA: Diagnosis not present

## 2017-11-13 DIAGNOSIS — L89312 Pressure ulcer of right buttock, stage 2: Secondary | ICD-10-CM | POA: Diagnosis not present

## 2017-11-13 DIAGNOSIS — I10 Essential (primary) hypertension: Secondary | ICD-10-CM | POA: Diagnosis not present

## 2017-11-13 DIAGNOSIS — S72115A Nondisplaced fracture of greater trochanter of left femur, initial encounter for closed fracture: Secondary | ICD-10-CM | POA: Diagnosis not present

## 2017-11-13 DIAGNOSIS — R1310 Dysphagia, unspecified: Secondary | ICD-10-CM | POA: Diagnosis not present

## 2017-11-14 DIAGNOSIS — M069 Rheumatoid arthritis, unspecified: Secondary | ICD-10-CM | POA: Diagnosis present

## 2017-11-14 DIAGNOSIS — G4733 Obstructive sleep apnea (adult) (pediatric): Secondary | ICD-10-CM | POA: Diagnosis not present

## 2017-11-14 DIAGNOSIS — Z7409 Other reduced mobility: Secondary | ICD-10-CM | POA: Diagnosis not present

## 2017-11-14 DIAGNOSIS — I10 Essential (primary) hypertension: Secondary | ICD-10-CM | POA: Diagnosis not present

## 2017-11-14 DIAGNOSIS — D61818 Other pancytopenia: Secondary | ICD-10-CM | POA: Diagnosis not present

## 2017-11-14 DIAGNOSIS — R05 Cough: Secondary | ICD-10-CM | POA: Diagnosis not present

## 2017-11-14 DIAGNOSIS — R633 Feeding difficulties: Secondary | ICD-10-CM | POA: Diagnosis not present

## 2017-11-14 DIAGNOSIS — M0680 Other specified rheumatoid arthritis, unspecified site: Secondary | ICD-10-CM | POA: Diagnosis not present

## 2017-11-14 DIAGNOSIS — L89312 Pressure ulcer of right buttock, stage 2: Secondary | ICD-10-CM | POA: Diagnosis not present

## 2017-11-14 DIAGNOSIS — E785 Hyperlipidemia, unspecified: Secondary | ICD-10-CM | POA: Diagnosis not present

## 2017-11-14 DIAGNOSIS — K52831 Collagenous colitis: Secondary | ICD-10-CM | POA: Diagnosis not present

## 2017-11-14 DIAGNOSIS — J449 Chronic obstructive pulmonary disease, unspecified: Secondary | ICD-10-CM | POA: Diagnosis present

## 2017-11-14 DIAGNOSIS — Z87891 Personal history of nicotine dependence: Secondary | ICD-10-CM | POA: Diagnosis not present

## 2017-11-14 DIAGNOSIS — Z6825 Body mass index (BMI) 25.0-25.9, adult: Secondary | ICD-10-CM | POA: Diagnosis not present

## 2017-11-14 DIAGNOSIS — G629 Polyneuropathy, unspecified: Secondary | ICD-10-CM | POA: Diagnosis not present

## 2017-11-14 DIAGNOSIS — K449 Diaphragmatic hernia without obstruction or gangrene: Secondary | ICD-10-CM | POA: Diagnosis not present

## 2017-11-14 DIAGNOSIS — S32038D Other fracture of third lumbar vertebra, subsequent encounter for fracture with routine healing: Secondary | ICD-10-CM | POA: Diagnosis not present

## 2017-11-14 DIAGNOSIS — Z931 Gastrostomy status: Secondary | ICD-10-CM | POA: Diagnosis not present

## 2017-11-14 DIAGNOSIS — S32502D Unspecified fracture of left pubis, subsequent encounter for fracture with routine healing: Secondary | ICD-10-CM | POA: Diagnosis not present

## 2017-11-14 DIAGNOSIS — S32502A Unspecified fracture of left pubis, initial encounter for closed fracture: Secondary | ICD-10-CM | POA: Diagnosis not present

## 2017-11-14 DIAGNOSIS — R41841 Cognitive communication deficit: Secondary | ICD-10-CM | POA: Diagnosis not present

## 2017-11-14 DIAGNOSIS — S32039A Unspecified fracture of third lumbar vertebra, initial encounter for closed fracture: Secondary | ICD-10-CM | POA: Diagnosis not present

## 2017-11-14 DIAGNOSIS — B3781 Candidal esophagitis: Secondary | ICD-10-CM | POA: Diagnosis not present

## 2017-11-14 DIAGNOSIS — S32592A Other specified fracture of left pubis, initial encounter for closed fracture: Secondary | ICD-10-CM | POA: Diagnosis not present

## 2017-11-14 DIAGNOSIS — S72112A Displaced fracture of greater trochanter of left femur, initial encounter for closed fracture: Secondary | ICD-10-CM | POA: Diagnosis not present

## 2017-11-14 DIAGNOSIS — K219 Gastro-esophageal reflux disease without esophagitis: Secondary | ICD-10-CM | POA: Diagnosis present

## 2017-11-14 DIAGNOSIS — R262 Difficulty in walking, not elsewhere classified: Secondary | ICD-10-CM | POA: Diagnosis not present

## 2017-11-14 DIAGNOSIS — Z4789 Encounter for other orthopedic aftercare: Secondary | ICD-10-CM | POA: Diagnosis not present

## 2017-11-14 DIAGNOSIS — E43 Unspecified severe protein-calorie malnutrition: Secondary | ICD-10-CM | POA: Diagnosis not present

## 2017-11-14 DIAGNOSIS — R1312 Dysphagia, oropharyngeal phase: Secondary | ICD-10-CM | POA: Diagnosis not present

## 2017-11-14 DIAGNOSIS — D539 Nutritional anemia, unspecified: Secondary | ICD-10-CM | POA: Diagnosis not present

## 2017-11-14 DIAGNOSIS — Z9181 History of falling: Secondary | ICD-10-CM | POA: Diagnosis not present

## 2017-11-14 DIAGNOSIS — K7460 Unspecified cirrhosis of liver: Secondary | ICD-10-CM | POA: Diagnosis present

## 2017-11-14 DIAGNOSIS — I2510 Atherosclerotic heart disease of native coronary artery without angina pectoris: Secondary | ICD-10-CM | POA: Diagnosis not present

## 2017-11-14 DIAGNOSIS — M8589 Other specified disorders of bone density and structure, multiple sites: Secondary | ICD-10-CM | POA: Diagnosis not present

## 2017-11-14 DIAGNOSIS — R1310 Dysphagia, unspecified: Secondary | ICD-10-CM | POA: Diagnosis not present

## 2017-11-14 DIAGNOSIS — Z7982 Long term (current) use of aspirin: Secondary | ICD-10-CM | POA: Diagnosis not present

## 2017-11-14 DIAGNOSIS — R2689 Other abnormalities of gait and mobility: Secondary | ICD-10-CM | POA: Diagnosis not present

## 2017-11-14 DIAGNOSIS — W1831XA Fall on same level due to stepping on an object, initial encounter: Secondary | ICD-10-CM | POA: Diagnosis not present

## 2017-11-14 DIAGNOSIS — S72115A Nondisplaced fracture of greater trochanter of left femur, initial encounter for closed fracture: Secondary | ICD-10-CM | POA: Diagnosis not present

## 2017-11-14 DIAGNOSIS — M109 Gout, unspecified: Secondary | ICD-10-CM | POA: Diagnosis not present

## 2017-11-14 DIAGNOSIS — S72112D Displaced fracture of greater trochanter of left femur, subsequent encounter for closed fracture with routine healing: Secondary | ICD-10-CM | POA: Diagnosis not present

## 2017-11-14 DIAGNOSIS — R278 Other lack of coordination: Secondary | ICD-10-CM | POA: Diagnosis not present

## 2017-11-14 DIAGNOSIS — D696 Thrombocytopenia, unspecified: Secondary | ICD-10-CM | POA: Diagnosis not present

## 2017-11-14 DIAGNOSIS — M6281 Muscle weakness (generalized): Secondary | ICD-10-CM | POA: Diagnosis not present

## 2017-11-14 DIAGNOSIS — M84453A Pathological fracture, unspecified femur, initial encounter for fracture: Secondary | ICD-10-CM | POA: Diagnosis not present

## 2017-11-18 ENCOUNTER — Inpatient Hospital Stay
Admission: RE | Admit: 2017-11-18 | Discharge: 2017-12-22 | Disposition: A | Discharge status: Home / Self Care | Payer: Medicare Other | Source: Ambulatory Visit | Attending: Internal Medicine | Admitting: Internal Medicine

## 2017-11-18 ENCOUNTER — Other Ambulatory Visit: Payer: Self-pay

## 2017-11-18 DIAGNOSIS — S72102D Unspecified trochanteric fracture of left femur, subsequent encounter for closed fracture with routine healing: Secondary | ICD-10-CM | POA: Diagnosis not present

## 2017-11-18 DIAGNOSIS — M6281 Muscle weakness (generalized): Secondary | ICD-10-CM | POA: Diagnosis not present

## 2017-11-18 DIAGNOSIS — S32030D Wedge compression fracture of third lumbar vertebra, subsequent encounter for fracture with routine healing: Secondary | ICD-10-CM | POA: Diagnosis not present

## 2017-11-18 DIAGNOSIS — R41841 Cognitive communication deficit: Secondary | ICD-10-CM | POA: Diagnosis not present

## 2017-11-18 DIAGNOSIS — G4733 Obstructive sleep apnea (adult) (pediatric): Secondary | ICD-10-CM | POA: Diagnosis not present

## 2017-11-18 DIAGNOSIS — L89322 Pressure ulcer of left buttock, stage 2: Secondary | ICD-10-CM | POA: Diagnosis not present

## 2017-11-18 DIAGNOSIS — M0680 Other specified rheumatoid arthritis, unspecified site: Secondary | ICD-10-CM | POA: Diagnosis not present

## 2017-11-18 DIAGNOSIS — I10 Essential (primary) hypertension: Secondary | ICD-10-CM | POA: Diagnosis not present

## 2017-11-18 DIAGNOSIS — S72112A Displaced fracture of greater trochanter of left femur, initial encounter for closed fracture: Secondary | ICD-10-CM | POA: Diagnosis not present

## 2017-11-18 DIAGNOSIS — E785 Hyperlipidemia, unspecified: Secondary | ICD-10-CM | POA: Diagnosis not present

## 2017-11-18 DIAGNOSIS — E43 Unspecified severe protein-calorie malnutrition: Secondary | ICD-10-CM | POA: Diagnosis not present

## 2017-11-18 DIAGNOSIS — S32038D Other fracture of third lumbar vertebra, subsequent encounter for fracture with routine healing: Secondary | ICD-10-CM | POA: Diagnosis not present

## 2017-11-18 DIAGNOSIS — K52831 Collagenous colitis: Secondary | ICD-10-CM | POA: Diagnosis not present

## 2017-11-18 DIAGNOSIS — K7460 Unspecified cirrhosis of liver: Secondary | ICD-10-CM | POA: Diagnosis not present

## 2017-11-18 DIAGNOSIS — I209 Angina pectoris, unspecified: Secondary | ICD-10-CM | POA: Diagnosis not present

## 2017-11-18 DIAGNOSIS — R1313 Dysphagia, pharyngeal phase: Secondary | ICD-10-CM | POA: Diagnosis not present

## 2017-11-18 DIAGNOSIS — K219 Gastro-esophageal reflux disease without esophagitis: Secondary | ICD-10-CM | POA: Diagnosis not present

## 2017-11-18 DIAGNOSIS — S72115D Nondisplaced fracture of greater trochanter of left femur, subsequent encounter for closed fracture with routine healing: Secondary | ICD-10-CM | POA: Diagnosis not present

## 2017-11-18 DIAGNOSIS — R188 Other ascites: Secondary | ICD-10-CM | POA: Diagnosis not present

## 2017-11-18 DIAGNOSIS — Z9181 History of falling: Secondary | ICD-10-CM | POA: Diagnosis not present

## 2017-11-18 DIAGNOSIS — I25119 Atherosclerotic heart disease of native coronary artery with unspecified angina pectoris: Secondary | ICD-10-CM | POA: Diagnosis not present

## 2017-11-18 DIAGNOSIS — R2689 Other abnormalities of gait and mobility: Secondary | ICD-10-CM | POA: Diagnosis not present

## 2017-11-18 DIAGNOSIS — R1312 Dysphagia, oropharyngeal phase: Secondary | ICD-10-CM | POA: Diagnosis not present

## 2017-11-18 DIAGNOSIS — S72102G Unspecified trochanteric fracture of left femur, subsequent encounter for closed fracture with delayed healing: Secondary | ICD-10-CM | POA: Diagnosis not present

## 2017-11-18 DIAGNOSIS — D696 Thrombocytopenia, unspecified: Secondary | ICD-10-CM | POA: Diagnosis not present

## 2017-11-18 DIAGNOSIS — M109 Gout, unspecified: Secondary | ICD-10-CM | POA: Diagnosis not present

## 2017-11-18 DIAGNOSIS — S72112D Displaced fracture of greater trochanter of left femur, subsequent encounter for closed fracture with routine healing: Secondary | ICD-10-CM | POA: Diagnosis not present

## 2017-11-18 DIAGNOSIS — Z4789 Encounter for other orthopedic aftercare: Secondary | ICD-10-CM | POA: Diagnosis not present

## 2017-11-18 DIAGNOSIS — S32030S Wedge compression fracture of third lumbar vertebra, sequela: Secondary | ICD-10-CM | POA: Diagnosis not present

## 2017-11-18 DIAGNOSIS — S32039A Unspecified fracture of third lumbar vertebra, initial encounter for closed fracture: Secondary | ICD-10-CM | POA: Diagnosis not present

## 2017-11-18 DIAGNOSIS — S32502D Unspecified fracture of left pubis, subsequent encounter for fracture with routine healing: Secondary | ICD-10-CM | POA: Diagnosis not present

## 2017-11-18 DIAGNOSIS — J383 Other diseases of vocal cords: Secondary | ICD-10-CM | POA: Diagnosis not present

## 2017-11-18 DIAGNOSIS — G629 Polyneuropathy, unspecified: Secondary | ICD-10-CM | POA: Diagnosis not present

## 2017-11-18 DIAGNOSIS — L89312 Pressure ulcer of right buttock, stage 2: Secondary | ICD-10-CM | POA: Diagnosis not present

## 2017-11-18 DIAGNOSIS — R531 Weakness: Secondary | ICD-10-CM | POA: Diagnosis not present

## 2017-11-18 DIAGNOSIS — J3489 Other specified disorders of nose and nasal sinuses: Secondary | ICD-10-CM | POA: Diagnosis not present

## 2017-11-18 DIAGNOSIS — M8589 Other specified disorders of bone density and structure, multiple sites: Secondary | ICD-10-CM | POA: Diagnosis not present

## 2017-11-18 DIAGNOSIS — K449 Diaphragmatic hernia without obstruction or gangrene: Secondary | ICD-10-CM | POA: Diagnosis not present

## 2017-11-18 DIAGNOSIS — I2510 Atherosclerotic heart disease of native coronary artery without angina pectoris: Secondary | ICD-10-CM | POA: Diagnosis not present

## 2017-11-18 DIAGNOSIS — R05 Cough: Secondary | ICD-10-CM | POA: Diagnosis not present

## 2017-11-18 DIAGNOSIS — B3781 Candidal esophagitis: Secondary | ICD-10-CM | POA: Diagnosis not present

## 2017-11-18 DIAGNOSIS — R278 Other lack of coordination: Secondary | ICD-10-CM | POA: Diagnosis not present

## 2017-11-18 DIAGNOSIS — S32592D Other specified fracture of left pubis, subsequent encounter for fracture with routine healing: Secondary | ICD-10-CM | POA: Diagnosis not present

## 2017-11-18 DIAGNOSIS — S32592G Other specified fracture of left pubis, subsequent encounter for fracture with delayed healing: Secondary | ICD-10-CM | POA: Diagnosis not present

## 2017-11-18 DIAGNOSIS — B379 Candidiasis, unspecified: Secondary | ICD-10-CM | POA: Diagnosis not present

## 2017-11-18 DIAGNOSIS — S32592A Other specified fracture of left pubis, initial encounter for closed fracture: Secondary | ICD-10-CM | POA: Diagnosis not present

## 2017-11-18 DIAGNOSIS — Z7409 Other reduced mobility: Secondary | ICD-10-CM | POA: Diagnosis not present

## 2017-11-18 DIAGNOSIS — R262 Difficulty in walking, not elsewhere classified: Secondary | ICD-10-CM | POA: Diagnosis not present

## 2017-11-18 DIAGNOSIS — J320 Chronic maxillary sinusitis: Secondary | ICD-10-CM | POA: Diagnosis not present

## 2017-11-18 DIAGNOSIS — R1310 Dysphagia, unspecified: Secondary | ICD-10-CM | POA: Diagnosis not present

## 2017-11-18 DIAGNOSIS — S32592S Other specified fracture of left pubis, sequela: Secondary | ICD-10-CM | POA: Diagnosis not present

## 2017-11-18 MED ORDER — TRAMADOL HCL 50 MG PO TABS: 50.0000 mg | ORAL_TABLET | Freq: Three times a day (TID) | ORAL | 0 refills | Status: AC | PRN

## 2017-11-18 NOTE — Telephone Encounter (Signed)
RX Fax for [facility]@ [phone]  

## 2017-11-19 ENCOUNTER — Non-Acute Institutional Stay (SKILLED_NURSING_FACILITY): Payer: Medicare Other | Admitting: Internal Medicine

## 2017-11-19 ENCOUNTER — Encounter: Payer: Self-pay | Admitting: Internal Medicine

## 2017-11-19 DIAGNOSIS — I209 Angina pectoris, unspecified: Secondary | ICD-10-CM

## 2017-11-19 DIAGNOSIS — G629 Polyneuropathy, unspecified: Secondary | ICD-10-CM

## 2017-11-19 DIAGNOSIS — D696 Thrombocytopenia, unspecified: Secondary | ICD-10-CM

## 2017-11-19 DIAGNOSIS — S72102G Unspecified trochanteric fracture of left femur, subsequent encounter for closed fracture with delayed healing: Secondary | ICD-10-CM | POA: Diagnosis not present

## 2017-11-19 DIAGNOSIS — R1310 Dysphagia, unspecified: Secondary | ICD-10-CM | POA: Diagnosis not present

## 2017-11-19 DIAGNOSIS — I25119 Atherosclerotic heart disease of native coronary artery with unspecified angina pectoris: Secondary | ICD-10-CM | POA: Diagnosis not present

## 2017-11-19 DIAGNOSIS — E43 Unspecified severe protein-calorie malnutrition: Secondary | ICD-10-CM

## 2017-11-19 DIAGNOSIS — S32592G Other specified fracture of left pubis, subsequent encounter for fracture with delayed healing: Secondary | ICD-10-CM | POA: Diagnosis not present

## 2017-11-19 NOTE — Progress Notes (Signed)
This is an acute visit.  Level care skilled.  Facility is [facility].  Chief complaint-acute visit status post hospitalization for pelvic and left hip fracture.  History of present illness.  Patient is a pleasant 81 year old Mathis here for rehab after hospitalization at [facility] with a history of recent pelvic and left hip fracture.  Patient was recently discharged from this facility after rehab-he has numerous issues including colitis with chronic diarrhea which appears to have largely resolved as well as coronary artery disease with a history of stents-hypertension GERD gout chronic elevated liver function tests-as well as hyperlipidemia and rheumatoid arthritis peripheral neuropathy and a history of prostate cancer status post prostatectomy as well as basal cell skin cancer.  He was in the facility after a prolonged hospitalization for weakness and diarrhea recently was at [facility] in August and September actually had a PEG tube placed to supplement his p.o. intake.  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  His rehab was complicated by a fever which required rehospitalization-he was diagnosed with pneumonia and treated with antibiotics and appeared to be stabilized.  He also had a gout flare was treated with prednisone as well as colchicine.  He also was treated for sinusitis.  At the end of his rehab course he had stabilized considerably was doing quite well with therapy using a walker.  He did go home apparently at home he had a fall after losing his balance while trying to take off his shirt.  He was taken to [facility] and he did her x-ray of the hip did not show any definite evidence of fracture but CT of the pelvis did show minimally displaced horizontal fracture of the left greater trochanter.  Also a minimally angulated fracture of the left inferior pubic  rabbi.  He was transferred to [facility] for orthopedic surgery consult.  Also did not feel he needed surgery and opted for medical management.  Pain was managed with Tylenol as well as tramadol as needed.  He was able to ambulate with nursing and physical therapy assistance with some pain.  He has been discharged here to continue his rehab.  Was also noted to have a stage II right gluteal pressure ulcer this was treated with topical treatments and will be followed by wound care here.  He also has a history of esophageal candidiasis.  He is completing a short course of Diflucan.  He continues with a PEG tube with a history of protein calorie malnutrition he is using a supplement to his oral intake.  He did have a speech therapist evaluate him at [facility] he showed some degree regurgitation and oropharyngeal dysfunction and recommended he be placed on a ground diet-and receives supplemental nutrition via the PEG tube.  He was also put on a 21-day course of Diflucan which should end on November 28.  This originally was ordered apparently on his original hospital admission but there was some break in therapy and thus it was restarted and again to be completed on 28 November.  Patient also experienced thrombocytopenia during his admission although this is not new it did show that it worsened somewhat with platelets down to Kevin,000 at one point  Heparin products were held and platelets did appear to go.  He did have an elevated MCV and normal B12 level and started on folate supplementation.  Thought his most likely cause of thrombocytopenia  was a cirrhosis  and would warrant GI follow-up  Additional platelet count on discharge yesterday was 62,000 trending up.  Regards to this on allopurinol and colchicine his allopurinol was temporarilyL secondary to concerns of thrombocytopenia but it was thought this was eventually not really contributing to  thrombocytopenia and has been restarted currently he is sitting in his wheelchair comfortably actually he was playing checkers with his granddaughter earlier today   He appears to be in good spirits does not complain of acute pain however with movement still has some discomfort-vital signs have been stable  Past Medical History:  Diagnosis Date  . Collagenous colitis   . Coronary atherosclerosis of native coronary artery    DES RCA 5/11, LVEF 55%  . Essential hypertension, benign   . GERD (gastroesophageal reflux disease)   . Hiatal hernia   . Mixed hyperlipidemia   . Obstructive sleep apnea   . Prostate cancer ([place])   . Pulmonary nodules   . Rheumatoid arthritis [facility])          Past Surgical History:  Procedure Laterality Date  . APPENDECTOMY    . COLONOSCOPY  06/2016   Dr. Britta Mccreedy: mild diverticulosis in left colon, sessile polyp 3-7 mm in distal descending colon, biopsies of colon to assess for microscopic colitis. path: collagenous colitis. colon polyp was benign polypoid colorectal mucosa   . COLONOSCOPY  04/2014   Dr. Britta Mccreedy: office notes stated 3 tubular adenomas and diverticulosis of left colon   . CORONARY ANGIOPLASTY WITH STENT PLACEMENT  2011  . ESOPHAGOGASTRODUODENOSCOPY  06/2016   Dr. Britta Mccreedy: gastritis, path: chronic active gastritis, negative H.pylori   . HEMORRHOID SURGERY    . LAPAROSCOPIC CHOLECYSTECTOMY    . PROSTATECTOMY    . Skin cancer resection        reports that he quit smoking about 25 years ago. His smoking use included cigarettes. He started smoking about 65 years ago. He has a 40.00 pack-year smoking history. he has never used smokeless tobacco. He reports that he drinks alcohol. He reports that he does not use drugs. Social History        Socioeconomic History  . Marital status: Married    Spouse name: Not on file  . Number of children: Not on file  . Years of education: Not on file  . Highest education level:  Not on file  Social Needs  . Financial resource strain: Not on file  . Food insecurity - worry: Not on file  . Food insecurity - inability: Not on file  . Transportation needs - medical: Not on file  . Transportation needs - non-medical: Not on file  Occupational History  . Not on file  Tobacco Use  . Smoking status: Former Smoker    Packs/day: 1.00    Years: 40.00    Pack years: 40.00    Types: Cigarettes    Start date: 12/30/1951    Last attempt to quit: 12/30/1991    Years since quitting: 25.8  . Smokeless tobacco: Never Used  Substance and Sexual Activity  . Alcohol use: Yes    Alcohol/week: 0.0 oz    Comment: 1 happy hour beer a day   . Drug use: No  . Sexual activity: Not on file  Other Topics Concern  . Not on file  Social History Narrative  . Not on file   Functional Status Survey:       Allergies  Allergen Reactions  . Cefprozil   . Doxycycline Hives and Itching  .  Pineapple     Other reaction(s): Bleeding (intolerance) Mouth burns and bleeds  . Zenpep [Pancrelipase (Lip-Prot-Amyl)]     Patient does not recall reaction but states he had to stop because it was "bad". I have listed in allergies, as we are not able to determine what this was.   . Levofloxacin Rash    Unknown REACTION: rash  . Penicillins Rash    REACTION: hives  . Statins Other (See Comments) and Rash    aching aching        Pertinent  Health Maintenance Due  Topic Date Due  . INFLUENZA VACCINE  11/28/2017 (Originally 07/29/2017)  . PNA vac Low Risk Adult (1 of 2 - PCV13) 11/28/2017 (Originally 11     MEDICATIONS  Tylenol 500 mg take 2 tabs every 8 hours do not exceed 3 g in 24 hours.  Aldactone 25 mg daily.  Artificial tears as needed.  Aspirin 81 mg daily.  Budesonide 9 mg daily.  Calcium carbonate vitamin D 250-125 mg-unit-every morning.  Vitamin D 1000 units every morning.  Colchicine 0.6 mg twice daily.  Vitamin B12 1000 mcg every  morning.  Diflucan 100 mg once a day until November 25, 2017.  Evoxac 30 mg capsule 3 times daily  Folic acid 1 mg every morning.  Lopressor 12.5 mg twice daily.  Multivitamin daily.  Neurontin 300 mg 3 times daily.  Nitrostat as needed chest pain.  Protonix 40 mg twice daily.  Temovate cream-0- 05% apply twice a day as needed to oral LiQsorb for oral lichen planus flare.  Tramadol 50 mg every 8 hours as needed.  Allopurinol 100 mg twice daily.  Review of systems.  In general he is complain of any fever or chills.  Skin does not complain of rashes or itching does have a gluteal pressure ulcer right side followed by wound care.  Head ears eyes nose mouth and throat is visual changes or sore throat or sore mouth at this time.  Respiratory does not complain of shortness of breath or cough.  Cardiac does not complain of chest pain or significant lower extremity edema.  GI does not complain of nausea vomiting diarrhea constipation or abdominal discomfort he does have a PEG tube.  GU does not have dysuria.  Musculoskeletal does still complain of some hip and pelvic discomfort with movement apparently tramadol and Tylenol is effective for pain management.  Neurologic does not complain of dizziness headache or increased numbness.  Psych does not complain of depression and anxiety appears to be in good spirits visiting with his wife in the room today.  Physical exam.  He is afebrile pulse of 80 respirations of 16 blood pressure taken manually 130/82.  In general this is a pleasant elderly Mathis in no distress sitting comfortably in his wheelchair.  His skin is warm and dry.  Eyes sclera and conjunctive are clear visual acuity appears grossly intact.  Oropharynx is clear mucous membranes moist.  Chest is clear to auscultation there is no labored breathing somewhat shallow air entry.  Heart is regular rate and rhythm without murmur gallop or rub he does not have  significant lower extremity edema he does have some venous stasis changes -- most noticeable in his feet bilaterally.   Abdomen is soft and nontender with positive bowel sounds.  Musculoskeletal he is able to move all extremities x4 does have lower extremity weakness with history of the hip and pelvic fractures upper extremity strength appears preserved.  Neurologic could not appreciate any  lateralizing findings his speech is clear he is alert.  Psych is alert and oriented and very pleasant and appropriate which is his baseline.  Labs.    November 18, 2017.  WBC 5.3 hemoglobin 12.9 platelets 62,000.  Sodium 139 potassium 4.1 BUN 24 creatinine 0.67.  November 16, 2017.  Bilirubin 0.5-is to-ALT 27 alkaline phosphatase is 82.  Assessment plan.  1.  History of left greater trochanter fracture and left pubic ring fracture- again evaluated by orthopedics at [facility] and option was for medical management-pain is controlled with routine Tylenol as needed tramadol-he would benefit from PT OT here- which he is receiving.  Follow-up with orthopedics as needed.  2.  History of severe protein calorie malnutrition dysphasia as noted above he was evaluated by speech therapy at [facility] recommendation is to continue PEG tube for supplemental feeding-at this point will monitor-I note that albumin was 3.0 on November 19 which actually is a small improvement.   #3 history of cirrhosis with thrombocytopenia-again platelet count did go down to Kevin,000 and hospital products were discontinued-allopurinol also was held for short time but has since been restarted- was some thrombocytopenia most likely was due to his cirrhosis and was trending up at discharge will update this tomorrow as well as early next week-last platelet count was 62,000 on November 21.  Regards his cirrhosis he is on Aldactone will monitor- we will check his electrolytes BMP tomorrow  4.-   History of esophagitis and- he is completing course of Diflucan-he does not complain of sore throat this evening this will have to be monitored.  5.-  History of right gluteal pressure ulcer- was not assessed this evening secondary to patient positioning-this is followed by wound care-he did have dressing applied to it [facility] and was treated with trying to optimize nutrition and pressure reduction strategies-frequent turning-this will have to be watched.  6.-  Hypertension so far minimal readings with this appears stable on metoprolol blood pressure this evening was 130/82.  7.  History of coronary artery disease status post stenting continues on a beta-blocker aspirin as well as Nitrostat as needed at this point appears to be asymptomatic.  8.  History of colitis with recurrent diarrhea this is been stable now for some time  #9-history of GERD he is on Protonix twice daily at this point will monitor complicated again with his history of esophagitis as well which he is completing a course of Diflucan.  10.  History of peripheral neuropathy is on Neurontin 600 mg 3 times daily mainly obtains this in the p.m. hours he says this has been much more effective in helping with his symptoms he does receive this at 3 PM-6 PM-as well as 9 PM.  11.  History of gout-at this point will monitor he continues on colchicine and allopurinol--- the allopurinol was temporarily held in the hospital has been restarted thought not to be contributing to his thrombocytopenia  12.-  History of prostate cancer status post prostatectomy at this point will monitor-she is not complaining of symptoms currently.  13.  Previous history of pneumonia he responded well to antibiotics in the past does not show evidence of respiratory issues at this point no coughing fever chills or complaints of shortness of breath.  14.  History of cirrhosis with ascites this is been well controlled on the Aldactone at this point  will monitor keep an eye on his electrolytes he does have a BMP pending for tomorrow.  15.-History of thrombocytopenia as noted above and this was an issue in the hospital the platelets are trending up and we are updating his CBC tomorrow as well as early next week.  This was  thought most likely due to his cirrhosis.   [id number]  Of note greater than 45 minutes spent assessing patient-reviewing his chart-reviewing his labs- and coordinating and formulating a plan of care for numerous diagnoses- of note greater than 50% of time spent coordinating a plan of care

## 2017-11-20 ENCOUNTER — Encounter (HOSPITAL_COMMUNITY)
Admission: RE | Admit: 2017-11-20 | Discharge: 2017-11-20 | Disposition: A | Discharge status: Home / Self Care | Payer: Medicare Other | Source: Skilled Nursing Facility | Attending: Internal Medicine | Admitting: Internal Medicine

## 2017-11-20 LAB — BASIC METABOLIC PANEL
Anion gap: 8 (ref 5–15)
BUN: 26 mg/dL — ABNORMAL HIGH (ref 6–20)
CHLORIDE: 100 mmol/L — AB (ref 101–111)
CO2: 28 mmol/L (ref 22–32)
CREATININE: 0.74 mg/dL (ref 0.61–1.24)
Calcium: 8.8 mg/dL — ABNORMAL LOW (ref 8.9–10.3)
Glucose, Bld: 143 mg/dL — ABNORMAL HIGH (ref 65–99)
POTASSIUM: 4.7 mmol/L (ref 3.5–5.1)
SODIUM: 136 mmol/L (ref 135–145)

## 2017-11-20 LAB — CBC WITH DIFFERENTIAL/PLATELET
BASOS PCT: 0 %
Basophils Absolute: 0 10*3/uL (ref 0.0–0.1)
EOS ABS: 0.1 10*3/uL (ref 0.0–0.7)
Eosinophils Relative: 1 %
HCT: 44.7 % (ref 39.0–52.0)
HEMOGLOBIN: 13.6 g/dL (ref 13.0–17.0)
Lymphocytes Relative: 30 %
Lymphs Abs: 2 10*3/uL (ref 0.7–4.0)
MCH: 30.4 pg (ref 26.0–34.0)
MCHC: 30.4 g/dL (ref 30.0–36.0)
MCV: 99.8 fL (ref 78.0–100.0)
Monocytes Absolute: 0.4 10*3/uL (ref 0.1–1.0)
Monocytes Relative: 5 %
NEUTROS PCT: 64 %
Neutro Abs: 4.2 10*3/uL (ref 1.7–7.7)
PLATELETS: 78 10*3/uL — AB (ref 150–400)
RBC: 4.48 MIL/uL (ref 4.22–5.81)
RDW: 16.8 % — ABNORMAL HIGH (ref 11.5–15.5)
WBC: 6.6 10*3/uL (ref 4.0–10.5)

## 2017-11-23 ENCOUNTER — Non-Acute Institutional Stay (SKILLED_NURSING_FACILITY): Payer: Medicare Other | Admitting: Internal Medicine

## 2017-11-23 ENCOUNTER — Encounter: Payer: Self-pay | Admitting: Internal Medicine

## 2017-11-23 ENCOUNTER — Encounter (HOSPITAL_COMMUNITY)
Admission: RE | Admit: 2017-11-23 | Discharge: 2017-11-23 | Disposition: A | Discharge status: Home / Self Care | Payer: Medicare Other | Source: Skilled Nursing Facility | Attending: Internal Medicine | Admitting: Internal Medicine

## 2017-11-23 DIAGNOSIS — S32592S Other specified fracture of left pubis, sequela: Secondary | ICD-10-CM | POA: Diagnosis not present

## 2017-11-23 DIAGNOSIS — S32030S Wedge compression fracture of third lumbar vertebra, sequela: Secondary | ICD-10-CM

## 2017-11-23 DIAGNOSIS — D696 Thrombocytopenia, unspecified: Secondary | ICD-10-CM | POA: Insufficient documentation

## 2017-11-23 DIAGNOSIS — R1310 Dysphagia, unspecified: Secondary | ICD-10-CM | POA: Diagnosis not present

## 2017-11-23 DIAGNOSIS — J320 Chronic maxillary sinusitis: Secondary | ICD-10-CM

## 2017-11-23 DIAGNOSIS — R531 Weakness: Secondary | ICD-10-CM

## 2017-11-23 DIAGNOSIS — R188 Other ascites: Secondary | ICD-10-CM | POA: Diagnosis not present

## 2017-11-23 DIAGNOSIS — S32030A Wedge compression fracture of third lumbar vertebra, initial encounter for closed fracture: Secondary | ICD-10-CM | POA: Insufficient documentation

## 2017-11-23 DIAGNOSIS — K7460 Unspecified cirrhosis of liver: Secondary | ICD-10-CM | POA: Insufficient documentation

## 2017-11-23 DIAGNOSIS — K52831 Collagenous colitis: Secondary | ICD-10-CM | POA: Diagnosis not present

## 2017-11-23 DIAGNOSIS — E43 Unspecified severe protein-calorie malnutrition: Secondary | ICD-10-CM

## 2017-11-23 DIAGNOSIS — R1312 Dysphagia, oropharyngeal phase: Secondary | ICD-10-CM | POA: Insufficient documentation

## 2017-11-23 DIAGNOSIS — S72102D Unspecified trochanteric fracture of left femur, subsequent encounter for closed fracture with routine healing: Secondary | ICD-10-CM | POA: Insufficient documentation

## 2017-11-23 LAB — CBC WITH DIFFERENTIAL/PLATELET
BASOS ABS: 0 10*3/uL (ref 0.0–0.1)
Basophils Relative: 0 %
EOS ABS: 0 10*3/uL (ref 0.0–0.7)
Eosinophils Relative: 1 %
HCT: 39.6 % (ref 39.0–52.0)
Hemoglobin: 12.2 g/dL — ABNORMAL LOW (ref 13.0–17.0)
LYMPHS ABS: 2.6 10*3/uL (ref 0.7–4.0)
LYMPHS PCT: 36 %
MCH: 30.2 pg (ref 26.0–34.0)
MCHC: 30.8 g/dL (ref 30.0–36.0)
MCV: 98 fL (ref 78.0–100.0)
Monocytes Absolute: 0.5 10*3/uL (ref 0.1–1.0)
Monocytes Relative: 7 %
NEUTROS PCT: 56 %
Neutro Abs: 4 10*3/uL (ref 1.7–7.7)
PLATELETS: 95 10*3/uL — AB (ref 150–400)
RBC: 4.04 MIL/uL — AB (ref 4.22–5.81)
RDW: 16.4 % — ABNORMAL HIGH (ref 11.5–15.5)
WBC: 7.1 10*3/uL (ref 4.0–10.5)

## 2017-11-23 MED ORDER — SPIRONOLACTONE 25 MG PO TABS
25.00 mg | ORAL_TABLET | ORAL | Status: DC
Start: 2017-11-19 — End: 2017-11-23

## 2017-11-23 MED ORDER — FOLIC ACID 1 MG PO TABS
1.00 mg | ORAL_TABLET | ORAL | Status: DC
Start: 2017-11-19 — End: 2017-11-23

## 2017-11-23 MED ORDER — FLUCONAZOLE 100 MG PO TABS
100.00 mg | ORAL_TABLET | ORAL | Status: DC
Start: 2017-11-18 — End: 2017-11-23

## 2017-11-23 MED ORDER — GABAPENTIN 300 MG PO CAPS
600.00 mg | ORAL_CAPSULE | ORAL | Status: DC
Start: 2017-11-18 — End: 2017-11-23

## 2017-11-23 MED ORDER — METOPROLOL TARTRATE 25 MG PO TABS
12.50 mg | ORAL_TABLET | ORAL | Status: DC
Start: 2017-11-18 — End: 2017-11-23

## 2017-11-23 MED ORDER — ALBUTEROL SULFATE HFA 108 (90 BASE) MCG/ACT IN AERS
2.00 | INHALATION_SPRAY | RESPIRATORY_TRACT | Status: DC
Start: ? — End: 2017-11-23

## 2017-11-23 MED ORDER — ALLOPURINOL 100 MG PO TABS
100.00 mg | ORAL_TABLET | ORAL | Status: DC
Start: 2017-11-18 — End: 2017-11-23

## 2017-11-23 MED ORDER — BUDESONIDE 3 MG PO CPEP
9.00 mg | ORAL_CAPSULE | ORAL | Status: DC
Start: 2017-11-19 — End: 2017-11-23

## 2017-11-23 MED ORDER — ASPIRIN 81 MG PO CHEW
81.00 mg | CHEWABLE_TABLET | ORAL | Status: DC
Start: 2017-11-19 — End: 2017-11-23

## 2017-11-23 MED ORDER — PANTOPRAZOLE SODIUM 40 MG PO TBEC
40.00 mg | DELAYED_RELEASE_TABLET | ORAL | Status: DC
Start: 2017-11-19 — End: 2017-11-23

## 2017-11-23 MED ORDER — VITAMIN B-12 1000 MCG PO TABS
1000.00 | ORAL_TABLET | ORAL | Status: DC
Start: 2017-11-19 — End: 2017-11-23

## 2017-11-23 MED ORDER — GUAIFENESIN 100 MG/5ML PO SYRP
200.00 mg | ORAL_SOLUTION | ORAL | Status: DC
Start: ? — End: 2017-11-23

## 2017-11-23 MED ORDER — CHOLECALCIFEROL 25 MCG (1000 UT) PO TABS
1000.00 | ORAL_TABLET | ORAL | Status: DC
Start: 2017-11-19 — End: 2017-11-23

## 2017-11-23 MED ORDER — TRAMADOL HCL 50 MG PO TABS
50.00 mg | ORAL_TABLET | ORAL | Status: DC
Start: ? — End: 2017-11-23

## 2017-11-23 MED ORDER — MAGNESIUM OXIDE 400 MG PO TABS
400.00 mg | ORAL_TABLET | ORAL | Status: DC
Start: 2017-11-18 — End: 2017-11-23

## 2017-11-23 MED ORDER — ACETAMINOPHEN 500 MG PO TABS
1000.00 mg | ORAL_TABLET | ORAL | Status: DC
Start: 2017-11-18 — End: 2017-11-23

## 2017-11-23 NOTE — Progress Notes (Signed)
Provider:  Veleta Miners Location:   [facility] Room Number: 143/P Place of Service:  SNF (31)  PCP: [name], MD Patient Care Team: [name], MD as PCP - General (Family Medicine) Kevin Romney Cristopher Estimable, MD as Consulting Physician (Gastroenterology)  Extended Emergency Contact Information Primary Emergency Contact: Luan Pulling Address: [address]          [place], [place] [address] [name] of [place] Phone: [phone] Mobile Phone: [phone] Relation: Spouse  Code Status: Full Code Goals of Care: Advanced Directive information Advanced Directives 11/23/2017  Does Patient Have a Medical Advance Directive? Yes  Type of Advance Directive (No Data)  Does patient want to make changes to medical advance directive? No - Patient declined      Chief Complaint  Patient presents with  . New Admit To SNF    New Admission Visit    HPI: Patient is a 81 y.o. male seen today for admission to SNF for therapy after a fall and  sustaining Left Pubic Rami fracture, Horizontal fracture of left Greater Trochanter and Minimally Depressed L3 Superior end plate fracture. 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  He was initially evaluated in [place] and then transferred to [facility] where CT Scan of his Pelvis showed he had Left Pubic Rami fracture, Horizontal fracture of left Greater Trochanter and Minimally Depressed L3 Superior  end plate fracture. Ortho was consulted and they recommended Medical Management.  In the hospitla he also had worsening of his Thrombocytopenia. With lowest reaching to 48. His heparin products were stopped and Hematology thought it was due to his h/O Cirrhosis and does not require any othe rwork up right now. He also was discharged on Diflucan for 3 weeks to continue treatment for Esophageal Candidiasis.  He also was seen by Speech therapy in the hospital and FEES and PFS performed while inpatient showing some degree of regurgitation and oropharyngeal dysfunction they recommended  Diced meat diet.with aspiration precautions.  Patein doing well in facility. Thinks his pain is well controlled with tylenol and Ultram. No SOB or Cough. Eating well. IS able to put some weight on his left leg.   Past Medical History:  Diagnosis Date  . Collagenous colitis   . Coronary atherosclerosis of native coronary artery    DES RCA 5/11, LVEF 55%  . Essential hypertension, benign   . GERD (gastroesophageal reflux disease)   . Hiatal hernia   . Mixed hyperlipidemia   . Obstructive sleep apnea   . Prostate cancer ([place])   . Pulmonary nodules   . Rheumatoid arthritis [facility])    Past Surgical History:  Procedure Laterality Date  . APPENDECTOMY    . COLONOSCOPY  06/2016   Dr. Britta Mccreedy: mild diverticulosis in left colon, sessile polyp 3-7 mm in distal descending colon, biopsies of colon to assess for microscopic colitis. path: collagenous colitis. colon polyp was benign polypoid colorectal mucosa   . COLONOSCOPY  04/2014   Dr. Britta Mccreedy: office notes stated 3 tubular adenomas and  diverticulosis of left colon   . CORONARY ANGIOPLASTY WITH STENT PLACEMENT  2011  . ESOPHAGOGASTRODUODENOSCOPY  06/2016   Dr. Britta Mccreedy: gastritis, path: chronic active gastritis, negative H.pylori   . HEMORRHOID SURGERY    . LAPAROSCOPIC CHOLECYSTECTOMY    . PROSTATECTOMY    . Skin cancer resection      reports that he quit smoking  about 25 years ago. His smoking use included cigarettes. He started smoking about 65 years ago. He has a 40.00 pack-year smoking history. he has never used smokeless tobacco. He reports that he drinks alcohol. He reports that he does not use drugs. Social History   Socioeconomic History  . Marital status: Married    Spouse name: Not on file  . Number of children: Not on file  . Years of education: Not on file  . Highest education level: Not on file  Social Needs  . Financial resource strain: Not on file  . Food insecurity - worry: Not on file  . Food insecurity - inability: Not on file  . Transportation needs - medical: Not on file  . Transportation needs - non-medical: Not on file  Occupational History  . Not on file  Tobacco Use  . Smoking status: Former Smoker    Packs/day: 1.00    Years: 40.00    Pack years: 40.00    Types: Cigarettes    Start date: 12/30/1951    Last attempt to quit: 12/30/1991    Years since quitting: 25.9  . Smokeless tobacco: Never Used  Substance and Sexual Activity  . Alcohol use: Yes    Alcohol/week: 0.0 oz    Comment: 1 happy hour beer a day   . Drug use: No  . Sexual activity: Not on file  Other Topics Concern  . Not on file  Social History Narrative  . Not on file    Functional Status Survey:    Family History  Problem Relation Age of Onset  . Colon cancer Mother 27  . Hypertension Unknown     Health Maintenance  Topic Date Due  . INFLUENZA VACCINE  11/28/2017 (Originally 07/29/2017)  . TETANUS/TDAP  11/28/2017 (Originally 10/31/1955)  . PNA vac Low Risk Adult (1 of 2 - PCV13) 11/28/2017 (Originally 11/01/2003)    Allergies  Allergen Reactions  . Cefprozil   . Doxycycline Hives and Itching  . Pineapple     Other reaction(s): Bleeding (intolerance) Mouth burns and bleeds  . Zenpep [Pancrelipase (Lip-Prot-Amyl)]     Patient does not recall reaction but states he had to stop because it was "bad". I have listed in allergies, as we are  not able to determine what this was.   . Levofloxacin Rash    Unknown REACTION: rash  . Penicillins Rash    REACTION: hives  . Statins Other (See Comments) and Rash    aching aching    Allergies as of 11/23/2017      Reactions   Cefprozil    Doxycycline Hives, Itching   Pineapple    Other reaction(s): Bleeding (intolerance) Mouth burns and bleeds   Zenpep [pancrelipase (lip-prot-amyl)]    Patient does not recall reaction but states he had to stop because it was "bad". I have listed in allergies, as we are not able to determine what this was.    Levofloxacin Rash   Unknown REACTION: rash   Penicillins Rash   REACTION: hives   Statins Other (See Comments), Rash   aching aching      Medication  List    Notice   This visit is during an admission. Changes to the med list made in this visit will be reflected in the After Visit Summary of the admission.     Review of Systems  Review of Systems  Constitutional: Negative for activity change, appetite change, chills, diaphoresis, fatigue and fever.  HENT: Negative for mouth sores, postnasal drip, rhinorrhea, sinus pain and sore throat.   Respiratory: Negative for apnea, cough, chest tightness, shortness of breath and wheezing.   Cardiovascular: Negative for chest pain, palpitations and Positive for leg swelling.  Gastrointestinal: Negative for abdominal distention, abdominal pain, constipation, diarrhea, nausea and vomiting.  Genitourinary: Negative for dysuria and frequency.  Musculoskeletal: Negative for arthralgias, joint swelling and myalgias.  Skin: Negative for rash.  Neurological: Negative for dizziness, syncope, weakness, light-headedness and numbness.  Psychiatric/Behavioral: Negative for behavioral problems, confusion and sleep disturbance.     Vitals:   11/23/17 1837  BP: 103/67  Pulse: 73  Resp: 18  Temp: (!) 96.5 F (35.8 C)   There is no height or weight on file to calculate BMI. Physical Exam    Constitutional: He is oriented to person, place, and time. He appears well-developed and well-nourished.  HENT:  Head: Normocephalic.  Mouth/Throat: Oropharynx is clear and moist.  Eyes: Pupils are equal, round, and reactive to light.  Neck: Neck supple.  Cardiovascular: Normal rate and normal heart sounds.  No murmur heard. Pulmonary/Chest: Effort normal and breath sounds normal. No respiratory distress. He has no wheezes. He has no rales.  Abdominal: Soft. Bowel sounds are normal. He exhibits no distension. There is no tenderness. There is no rebound.  Has Peg tube  Musculoskeletal:  Moderate Edema Bilateral  Lymphadenopathy:    He has no cervical adenopathy.  Neurological: He is alert and oriented to person, place, and time.  No Focal deficits  Skin: Skin is warm and dry.  Has stage 2 sacral wound and laceration in left Arm.  Psychiatric: He has a normal mood and affect. His behavior is normal.    Labs reviewed: Basic Metabolic Panel: Recent Labs    10/12/17 0945 11/04/17 0615 11/20/17 0730  NA 136 136 136  K 4.2 4.1 4.7  CL 102 100* 100*  CO2 26 28 28   GLUCOSE 91 93 143*  BUN 22* 29* 26*  CREATININE 0.65 0.83 0.74  CALCIUM 8.3* 8.7* 8.8*   Liver Function Tests: Recent Labs    10/07/17 0704 10/12/17 0945 11/04/17 0615  AST 29 38 31  ALT 44 53 37  ALKPHOS 92 115 81  BILITOT 0.7 0.4 0.5  PROT 4.9* 6.3* 6.2*  ALBUMIN 1.9* 2.6* 2.7*   Recent Labs    08/15/17 1206  LIPASE 31   Recent Labs    09/24/17 0750  AMMONIA 37*   CBC: Recent Labs    10/12/17 0945 11/04/17 0615 11/20/17 0730 11/23/17 0300  WBC 8.3 9.4 6.6 7.1  NEUTROABS 5.4  --  4.2 4.0  HGB 12.5* 12.4* 13.6 12.2*  HCT 39.8 39.1 44.7 39.6  MCV 102.6* 98.5 99.8 98.0  PLT 130* 106* 78* 95*   Cardiac Enzymes: No results for input(s): CKTOTAL, CKMB, CKMBINDEX, TROPONINI in the last 8760 hours. BNP: Invalid input(s): POCBNP No results found for: HGBA1C Lab Results  Component Value Date    TSH 0.480 08/15/2017   Lab Results  Component Value Date   VITAMINB12 1,044 (H) 10/12/2017   No results found for: FOLATE No results found for: IRON, TIBC, FERRITIN  Imaging and Procedures obtained prior to SNF admission: No results found.  Assessment/Plan   Closed trochanteric fracture of left femur with Compression fracture of third lumbar vertebra and Fracture of Left Pubic Rami. Per ortho in [facility] patient to continue with Medical management with Pain controlled with Tylenol and Ultram. Patient doing well and working with the therapy. He is able to put some weight on that leg.  Esophageal candidiasis  He is on Diflucan and finishing course for total of 3 weeks  Collagenous colitis Was stable on Budesonide   Dysphagia, Was evaluated again this admission and is on modified diet with Grounded meat and aspiration precautions.  Thrombocytopenia  Was evaluated by Hematology and was thought to be due to Cirrhosis with worsening due to Heparin products. Platelets improved.  Cirrhosis of liver with ascites,  Continue aldactone. Follow up with Gastro as outpatietnt  Severe protein-calorie malnutrition  Still has Peg tube. Per patient he is getting everything Orally. On Jevity orally also Albumin improved to 2.9  Rapidly progressive weakness with Neuropathy Per Neurology he needs work up as out patient including plan for Muscle biopsy eventually. Continue to work with Therapy. Also on high doses of Neurontin Gout  Stable right now on Allopurinol and Colchicine.  Plan for discharge home   Family/ staff Communication:   Labs/tests ordered: Follow up CBC and CMP.  Total time spent in this patient care encounter was 45_ minutes; greater than 50% of the visit spent counseling patient, reviewing records , Labs and coordinating care for problems addressed at this encounter.

## 2017-11-25 DIAGNOSIS — R1310 Dysphagia, unspecified: Secondary | ICD-10-CM | POA: Diagnosis not present

## 2017-11-25 DIAGNOSIS — K52831 Collagenous colitis: Secondary | ICD-10-CM | POA: Diagnosis not present

## 2017-11-25 DIAGNOSIS — L89322 Pressure ulcer of left buttock, stage 2: Secondary | ICD-10-CM | POA: Diagnosis not present

## 2017-11-25 DIAGNOSIS — G629 Polyneuropathy, unspecified: Secondary | ICD-10-CM | POA: Diagnosis not present

## 2017-12-04 DIAGNOSIS — S72115D Nondisplaced fracture of greater trochanter of left femur, subsequent encounter for closed fracture with routine healing: Secondary | ICD-10-CM | POA: Diagnosis not present

## 2017-12-04 DIAGNOSIS — S32502D Unspecified fracture of left pubis, subsequent encounter for fracture with routine healing: Secondary | ICD-10-CM | POA: Diagnosis not present

## 2017-12-04 DIAGNOSIS — S32030D Wedge compression fracture of third lumbar vertebra, subsequent encounter for fracture with routine healing: Secondary | ICD-10-CM | POA: Diagnosis not present

## 2017-12-04 DIAGNOSIS — S32592D Other specified fracture of left pubis, subsequent encounter for fracture with routine healing: Secondary | ICD-10-CM | POA: Diagnosis not present

## 2017-12-08 ENCOUNTER — Encounter: Payer: Self-pay | Admitting: Internal Medicine

## 2017-12-08 NOTE — Progress Notes (Signed)
Location:   [facility] Room Number: 143/P Place of Service:  SNF (31) Provider:  Ree Edman, Silvestre Moment, MD  Patient Care Team: [name], MD as PCP - General (Family Medicine) [name], MD as Consulting Physician (Gastroenterology)  Extended Emergency Contact Information Primary Emergency Contact: Luan Pulling Address: [address]          [place], [place] [address] [name] of [place] Phone: [phone] Mobile Phone: [phone] Relation: Spouse  Code Status:  Full Code Goals of care: Advanced Directive information Advanced Directives 12/08/2017  Does Patient Have a Medical Advance Directive? Yes  Type of Advance Directive (No Data)  Does patient want to make changes to medical advance directive? No - Patient declined     Chief Complaint  Patient presents with  . Acute Visit    Patient c/o Pain in Peg Tube Site And Has Wound in his sacral area.is    HPI:  Pt is a 81 y.o. male seen today for an acute visit for    Past Medical History:  Diagnosis Date  . Collagenous colitis   . Coronary atherosclerosis of native coronary artery    DES RCA 5/11, LVEF 55%  . Essential hypertension, benign   . GERD (gastroesophageal reflux disease)   . Hiatal hernia   . Mixed hyperlipidemia   . Obstructive sleep apnea   . Prostate cancer ([place])   . Pulmonary nodules   . Rheumatoid arthritis [facility])    Past Surgical History:  Procedure Laterality Date  . APPENDECTOMY    . COLONOSCOPY  06/2016   Dr. Britta Mccreedy: mild diverticulosis in left colon, sessile polyp 3-7 mm in distal descending colon, biopsies of colon to assess for microscopic colitis. path: collagenous colitis. colon polyp was benign polypoid colorectal mucosa   . COLONOSCOPY  04/2014   Dr. Britta Mccreedy: office notes stated 3 tubular adenomas and diverticulosis of left colon   . CORONARY ANGIOPLASTY WITH STENT PLACEMENT  2011  . ESOPHAGOGASTRODUODENOSCOPY  06/2016   Dr. Britta Mccreedy:  gastritis, path: chronic active gastritis, negative H.pylori   . HEMORRHOID SURGERY    . LAPAROSCOPIC CHOLECYSTECTOMY    . PROSTATECTOMY    . Skin cancer resection      Allergies  Allergen Reactions  . Cefprozil   . Doxycycline Hives and Itching  . Pineapple     Other reaction(s): Bleeding (intolerance) Mouth burns and bleeds  . Zenpep [Pancrelipase (Lip-Prot-Amyl)]     Patient does not recall reaction but states he had to stop because it was "bad". I have listed in allergies, as we are not able to determine what this was.   . Levofloxacin Rash    Unknown REACTION: rash  . Penicillins Rash    REACTION: hives  . Statins Other (See Comments) and Rash    aching aching    Outpatient Encounter Medications as of 12/08/2017  Medication Sig  . acetaminophen (TYLENOL) 500 MG tablet Take 500 mg by mouth every 8 (eight) hours.   Marland Kitchen allopurinol (ZYLOPRIM) 100 MG tablet Place 100 mg into feeding tube 2 (two) times daily.  Marland Kitchen aspirin EC 81 MG tablet Take 81 mg by mouth daily.  . Budesonide ER 9 MG TB24 Take 9 mg by mouth daily.  . calcium-vitamin D (OSCAL WITH D) 250-125 MG-UNIT tablet Place 1 tablet into feeding tube daily.  . cevimeline (EVOXAC) 30 MG capsule Place 30 mg into feeding tube 3 (three) times daily. May add 1 tab at bedtime as needed.  Marland Kitchen  Cholecalciferol 1000 units capsule Place 1,000 Units into feeding tube daily.  . clobetasol (TEMOVATE) 0.05 % GEL Apply 0.05 % topically 2 (two) times daily.   . colchicine 0.6 MG tablet Take 0.6 mg by mouth 2 (two) times daily.   Marland Kitchen DEXTRAN 70-HYPROMELLOSE OP Place 1 drop into both eyes 3 (three) times daily as needed.  . folic acid (FOLVITE) 1 MG tablet Take 1 mg by mouth daily.  Marland Kitchen gabapentin (NEURONTIN) 300 MG capsule Take 600 mg by mouth 3 (three) times daily.  . metoprolol succinate (TOPROL-XL) 25 MG 24 hr tablet Take 12.5 mg by mouth 2 (two) times daily.  . Multiple Vitamins-Minerals (MULTIVITAMIN WITH MINERALS) tablet Take 1 tablet by  mouth daily.  . nitroGLYCERIN (NITROSTAT) 0.4 MG SL tablet Place 1 tablet (0.4 mg total) under the tongue every 5 (five) minutes as needed.  . Nutritional Supplements (FEEDING SUPPLEMENT, JEVITY 1.5 CAL,) LIQD Place 237 mLs into feeding tube 3 (three) times daily.   . pantoprazole sodium (PROTONIX) 40 mg/20 mL PACK Place 40 mg into feeding tube 2 (two) times daily.  Marland Kitchen spironolactone (ALDACTONE) 25 MG tablet Take 25 mg by mouth daily.  . traMADol (ULTRAM) 50 MG tablet Take 1 tablet (50 mg total) by mouth every 8 (eight) hours as needed.  . vitamin B-12 (CYANOCOBALAMIN) 1000 MCG tablet Place 1,000 mcg into feeding tube daily.  . [DISCONTINUED] fluconazole (DIFLUCAN) 100 MG tablet Take 100 mg daily by mouth. Until 11/20/2017   No facility-administered encounter medications on file as of 12/08/2017.      Review of Systems  Immunization History  Administered Date(s) Administered  . Influenza-Unspecified 08/29/2014, 11/06/2017   Pertinent  Health Maintenance Due  Topic Date Due  . PNA vac Low Risk Adult (1 of 2 - PCV13) 01/08/2018 (Originally 11/01/2003)  . INFLUENZA VACCINE  Completed   Fall Risk  08/26/2016  Falls in the past year? Yes  Comment Emmi Telephone Survey: data to providers prior to load  Number falls in past yr: 1  Comment Emmi Telephone Survey Actual Response = 1  Injury with Fall? Yes   Functional Status Survey:    There were no vitals filed for this visit. There is no height or weight on file to calculate BMI. Physical Exam  Labs reviewed: Recent Labs    10/12/17 0945 11/04/17 0615 11/20/17 0730  NA 136 136 136  K 4.2 4.1 4.7  CL 102 100* 100*  CO2 26 28 28   GLUCOSE 91 93 143*  BUN 22* 29* 26*  CREATININE 0.65 0.83 0.74  CALCIUM 8.3* 8.7* 8.8*   Recent Labs    10/07/17 0704 10/12/17 0945 11/04/17 0615  AST 29 38 31  ALT 44 53 37  ALKPHOS 92 115 81  BILITOT 0.7 0.4 0.5  PROT 4.9* 6.3* 6.2*  ALBUMIN 1.9* 2.6* 2.7*   Recent Labs     10/12/17 0945 11/04/17 0615 11/20/17 0730 11/23/17 0300  WBC 8.3 9.4 6.6 7.1  NEUTROABS 5.4  --  4.2 4.0  HGB 12.5* 12.4* 13.6 12.2*  HCT 39.8 39.1 44.7 39.6  MCV 102.6* 98.5 99.8 98.0  PLT 130* 106* 78* 95*   Lab Results  Component Value Date   TSH 0.480 08/15/2017   No results found for: HGBA1C Lab Results  Component Value Date   CHOL  05/08/2010    161        ATP III CLASSIFICATION:  <200     mg/dL   Desirable  200-239  mg/dL  Borderline High  >=240    mg/dL   High          HDL 27 (L) 05/08/2010   LDLCALC  05/08/2010    84        Total Cholesterol/HDL:CHD Risk Coronary Heart Disease Risk Table                     Men   Women  1/2 Average Risk   3.4   3.3  Average Risk       5.0   4.4  2 X Average Risk   9.6   7.1  3 X Average Risk  23.4   11.0        Use the calculated Patient Ratio above and the CHD Risk Table to determine the patient's CHD Risk.        ATP III CLASSIFICATION (LDL):  <100     mg/dL   Optimal  100-129  mg/dL   Near or Above                    Optimal  130-159  mg/dL   Borderline  160-189  mg/dL   High  >190     mg/dL   Very High   TRIG 248 (H) 05/08/2010   CHOLHDL 6.0 05/08/2010    Significant Diagnostic Results in last 30 days:  No results found.  Assessment/Plan There are no diagnoses linked to this encounter.   Family/ staff Communication:   Labs/tests ordered:

## 2017-12-11 ENCOUNTER — Encounter: Payer: Self-pay | Admitting: Internal Medicine

## 2017-12-11 ENCOUNTER — Non-Acute Institutional Stay (SKILLED_NURSING_FACILITY): Payer: Medicare Other | Admitting: Internal Medicine

## 2017-12-11 DIAGNOSIS — B379 Candidiasis, unspecified: Secondary | ICD-10-CM | POA: Diagnosis not present

## 2017-12-11 DIAGNOSIS — J3489 Other specified disorders of nose and nasal sinuses: Secondary | ICD-10-CM

## 2017-12-11 DIAGNOSIS — K7460 Unspecified cirrhosis of liver: Secondary | ICD-10-CM | POA: Diagnosis not present

## 2017-12-11 DIAGNOSIS — S72102D Unspecified trochanteric fracture of left femur, subsequent encounter for closed fracture with routine healing: Secondary | ICD-10-CM | POA: Diagnosis not present

## 2017-12-11 DIAGNOSIS — R188 Other ascites: Secondary | ICD-10-CM | POA: Diagnosis not present

## 2017-12-11 NOTE — Progress Notes (Signed)
Location:   [facility] Room Number: 143/P Place of Service:  SNF [phone]) Provider:  Halford Chessman, MD  Patient Care Team: [name], MD as PCP - General (Family Medicine) [name], MD as Consulting Physician (Gastroenterology)  Extended Emergency Contact Information Primary Emergency Contact: Luan Pulling Address: [address]          [place], [place] [address] [name] of [place] Phone: [phone] Mobile Phone: [phone] Relation: Spouse  Code Status:  Full Code Goals of care: Advanced Directive information Advanced Directives 12/11/2017  Does Patient Have a Medical Advance Directive? Yes  Type of Advance Directive (No Data)  Does patient want to make changes to medical advance directive? No - Patient declined     Chief Complaint  Patient presents with  . Acute Visit    Patient c/o Nyoka Cowden mucus from nostrils    HPI:  Pt is a 81 y.o. male seen today for an acute visit for complaints of some greenish discharge from his nose.  He would like some Mucinex thinking that will loosen up any discharge.  He does not complain of any shortness of breath or increased cough from baseline.  He does have a history of a recent left pubic ramus fracture as well as left greater trochanter minimally depressed L3 superior endplate fracture.  Multiple other medical problems include colitis with chronic diarrhea-coronary artery disease status post stents as well as rheumatoid arthritis chronic peripheral neuropathy gout cirrhosis protein calorie malnutrition has had a PEG tube placed for supplement and dysphagia.  Despite these numerous issues he is quite stable, and complaint today of the nasal discharge and would like some Mucinex.  Vital signs are stable   Past Medical History:  Diagnosis Date  . Collagenous colitis   . Coronary atherosclerosis of native coronary artery    DES RCA 5/11, LVEF 55%  . Essential hypertension, benign    . GERD (gastroesophageal reflux disease)   . Hiatal hernia   . Mixed hyperlipidemia   . Obstructive sleep apnea   . Prostate cancer ([place])   . Pulmonary nodules   . Rheumatoid arthritis [facility])    Past Surgical History:  Procedure Laterality Date  . APPENDECTOMY    . COLONOSCOPY  06/2016   Dr. Britta Mccreedy: mild diverticulosis in left colon, sessile polyp 3-7 mm in distal descending colon, biopsies of colon to assess for microscopic colitis. path: collagenous colitis. colon polyp was benign polypoid colorectal mucosa   . COLONOSCOPY  04/2014   Dr. Britta Mccreedy: office notes stated 3 tubular adenomas and diverticulosis of left colon   . CORONARY ANGIOPLASTY WITH STENT PLACEMENT  2011  . ESOPHAGOGASTRODUODENOSCOPY  06/2016   Dr. Britta Mccreedy: gastritis, path: chronic active gastritis, negative H.pylori   . HEMORRHOID SURGERY    . LAPAROSCOPIC CHOLECYSTECTOMY    . PROSTATECTOMY    . Skin cancer resection      Allergies  Allergen Reactions  . Cefprozil   . Doxycycline Hives and Itching  . Pineapple     Other reaction(s): Bleeding (intolerance) Mouth burns and bleeds  . Zenpep [Pancrelipase (Lip-Prot-Amyl)]     Patient does not recall reaction but states he had to stop because it was "bad". I have listed in allergies, as we are not able to determine what this was.   . Levofloxacin Rash    Unknown REACTION: rash  . Penicillins Rash    REACTION: hives  . Statins Other (See Comments) and Rash  aching aching    Outpatient Encounter Medications as of 12/11/2017  Medication Sig  . acetaminophen (TYLENOL) 500 MG tablet Take 500 mg by mouth every 8 (eight) hours.   Marland Kitchen allopurinol (ZYLOPRIM) 100 MG tablet Place 100 mg into feeding tube 2 (two) times daily.  Marland Kitchen aspirin EC 81 MG tablet Take 81 mg by mouth daily.  . Budesonide ER 9 MG TB24 Take 9 mg by mouth daily.  . calcium-vitamin D (OSCAL WITH D) 250-125 MG-UNIT tablet Place 1 tablet into feeding tube daily.  . cevimeline (EVOXAC) 30 MG capsule  Place 30 mg into feeding tube 3 (three) times daily. May add 1 tab at bedtime as needed.  . Cholecalciferol 1000 units capsule Place 1,000 Units into feeding tube daily.  . clobetasol (TEMOVATE) 0.05 % GEL Apply 0.05 % topically 2 (two) times daily.   . colchicine 0.6 MG tablet Take 0.6 mg by mouth 2 (two) times daily.   Marland Kitchen DEXTRAN 70-HYPROMELLOSE OP Place 1 drop into both eyes 3 (three) times daily as needed.  . folic acid (FOLVITE) 1 MG tablet Take 1 mg by mouth daily.  Marland Kitchen gabapentin (NEURONTIN) 300 MG capsule Take 600 mg by mouth 3 (three) times daily.  . metoprolol succinate (TOPROL-XL) 25 MG 24 hr tablet Take 12.5 mg by mouth 2 (two) times daily.  . Multiple Vitamins-Minerals (MULTIVITAMIN WITH MINERALS) tablet Take 1 tablet by mouth daily.  . nitroGLYCERIN (NITROSTAT) 0.4 MG SL tablet Place 1 tablet (0.4 mg total) under the tongue every 5 (five) minutes as needed.  . Nutritional Supplements (FEEDING SUPPLEMENT, JEVITY 1.5 CAL,) LIQD Place 237 mLs into feeding tube 3 (three) times daily.   . pantoprazole sodium (PROTONIX) 40 mg/20 mL PACK Place 40 mg into feeding tube 2 (two) times daily.  Marland Kitchen spironolactone (ALDACTONE) 25 MG tablet Take 25 mg by mouth daily.  . traMADol (ULTRAM) 50 MG tablet Take 1 tablet (50 mg total) by mouth every 8 (eight) hours as needed.  . vitamin B-12 (CYANOCOBALAMIN) 1000 MCG tablet Place 1,000 mcg into feeding tube daily.   No facility-administered encounter medications on file as of 12/11/2017.     Review of Systems   In general he is not complaining of any fever chills says he feels relatively well.  Skin does not complain of rashes or itching.  Head ears eyes nose mouth and throat is not complaining of any sore throat does complain of some nasal discharge.  Respiratory complaining of shortness of breath or cough.  Cardiac does not complain of chest pain or increased lower extremity edema from baseline.  GI does have a history of cirrhosis but is not  complaining of any nausea vomiting diarrhea at this time he does have a PEG tube placed.  Does not complain of constipation no abdominal discomfort.  Musculoskeletal does have weakness status post  fractures as noted above but pain appears to be well controlled.  Neurologic did not complaint of dizziness headache or syncope.  Psych does not complain of depression or anxiety appears to be in good spirits  Immunization History  Administered Date(s) Administered  . Influenza-Unspecified 08/29/2014, 11/06/2017   Pertinent  Health Maintenance Due  Topic Date Due  . PNA vac Low Risk Adult (1 of 2 - PCV13) 01/08/2018 (Originally 11/01/2003)  . INFLUENZA VACCINE  Completed   Fall Risk  08/26/2016  Falls in the past year? Yes  Comment Emmi Telephone Survey: data to providers prior to load  Number falls in past yr: 1  Comment Emmi Telephone Survey Actual Response = 1  Injury with Fall? Yes   Functional Status Survey:    Vitals:   12/11/17 1201  BP: (!) 104/56  Pulse: 70  Resp: 20  Temp: (!) 96.9 F (36.1 C)  TempSrc: Oral    Physical Exam   In general this is a pleasant elderly male in no distress sitting comfortably in his wheelchair.  His skin is warm and dry.  Nose could not really appreciate any active drainage at this time.  Oropharynx is clear mucous membranes moist I do not note any increased erythema.  Chest is clear to auscultation there is no labored breathing.  Heart is regular rate and rhythm without murmur gallop or rub he continues to have mild lower extremity edema bilaterally.  His abdomen is soft nontender with positive bowel sounds PEG site appears unremarkable.  Musculoskeletal does move all extremities x4 at baseline currently ambulating in wheelchair.  Neurologic is grossly intact no focal deficits his speech is clear cranial nerves appear intact.  Psyche is alert and oriented continues to be pleasant and appropriate  Labs reviewed: Recent Labs      10/12/17 0945 11/04/17 0615 11/20/17 0730  NA 136 136 136  K 4.2 4.1 4.7  CL 102 100* 100*  CO2 26 28 28   GLUCOSE 91 93 143*  BUN 22* 29* 26*  CREATININE 0.65 0.83 0.74  CALCIUM 8.3* 8.7* 8.8*   Recent Labs    10/07/17 0704 10/12/17 0945 11/04/17 0615  AST 29 38 31  ALT 44 53 37  ALKPHOS 92 115 81  BILITOT 0.7 0.4 0.5  PROT 4.9* 6.3* 6.2*  ALBUMIN 1.9* 2.6* 2.7*   Recent Labs    10/12/17 0945 11/04/17 0615 11/20/17 0730 11/23/17 0300  WBC 8.3 9.4 6.6 7.1  NEUTROABS 5.4  --  4.2 4.0  HGB 12.5* 12.4* 13.6 12.2*  HCT 39.8 39.1 44.7 39.6  MCV 102.6* 98.5 99.8 98.0  PLT 130* 106* 78* 95*   Lab Results  Component Value Date   TSH 0.480 08/15/2017   No results found for: HGBA1C Lab Results  Component Value Date   CHOL  05/08/2010    161        ATP III CLASSIFICATION:  <200     mg/dL   Desirable  200-239  mg/dL   Borderline High  >=240    mg/dL   High          HDL 27 (L) 05/08/2010   LDLCALC  05/08/2010    84        Total Cholesterol/HDL:CHD Risk Coronary Heart Disease Risk Table                     Men   Women  1/2 Average Risk   3.4   3.3  Average Risk       5.0   4.4  2 X Average Risk   9.6   7.1  3 X Average Risk  23.4   11.0        Use the calculated Patient Ratio above and the CHD Risk Table to determine the patient's CHD Risk.        ATP III CLASSIFICATION (LDL):  <100     mg/dL   Optimal  100-129  mg/dL   Near or Above                    Optimal  130-159  mg/dL   Borderline  160-189  mg/dL   High  >190     mg/dL   Very High   TRIG 248 (H) 05/08/2010   CHOLHDL 6.0 05/08/2010    Significant Diagnostic Results in last 30 days:  No results found.  Assessment/Plan  #1-history of nasal drainage-he does have an ENT consult scheduled for next week this is been somewhat chronic- will start Mucinex to see if this may help somewhat to loosening any secretions or discharge-he is not really complaining of any acute distress here but will have  to be monitored and will await ENT consult.  He does not complain of any increased cough congestion headache fever or chills but this will have to be watched  2.  History of closed trochanteric fracture of the left femur with compression fracture of the third lumbar vertebrae and fracture of the left pubic rami.  Recommendation by orthosis to continue medical management with pain control he is on Tylenol and tramadol and apparently this has been effective he is not really complaining of pain today.  3.  History of cirrhosis with ascites-he continues on Aldactone this appears to be stable--his edema appears to be at baseline--weight appears relatively baseline he has gained it appears about 3-4 pounds over the past month-at this point will monitor but clinically appears to be doing well.  4.  History of esophageal candidiasis-this appears resolved he was treated with Diflucan during this hospitalization-oropharynx exam is quite benign today.  [id number]  Of note greater than 25 minutes spent assessing patient-discussing his status with nursing staff-reviewing his chart and labs-and coordinating the plan of care for numerous diagnoses- of note greater than 50% of time spent coordinating plan of care

## 2017-12-12 ENCOUNTER — Encounter: Payer: Self-pay | Admitting: Internal Medicine

## 2017-12-15 ENCOUNTER — Ambulatory Visit: Payer: Medicare Other | Admitting: Neurology

## 2017-12-16 DIAGNOSIS — R1313 Dysphagia, pharyngeal phase: Secondary | ICD-10-CM | POA: Diagnosis not present

## 2017-12-16 DIAGNOSIS — J383 Other diseases of vocal cords: Secondary | ICD-10-CM | POA: Diagnosis not present

## 2017-12-19 ENCOUNTER — Encounter: Payer: Self-pay | Admitting: Internal Medicine

## 2017-12-19 ENCOUNTER — Non-Acute Institutional Stay (SKILLED_NURSING_FACILITY): Payer: Medicare Other | Admitting: Internal Medicine

## 2017-12-19 DIAGNOSIS — K7460 Unspecified cirrhosis of liver: Secondary | ICD-10-CM

## 2017-12-19 DIAGNOSIS — K52831 Collagenous colitis: Secondary | ICD-10-CM

## 2017-12-19 DIAGNOSIS — G629 Polyneuropathy, unspecified: Secondary | ICD-10-CM | POA: Diagnosis not present

## 2017-12-19 DIAGNOSIS — D696 Thrombocytopenia, unspecified: Secondary | ICD-10-CM

## 2017-12-19 DIAGNOSIS — R188 Other ascites: Secondary | ICD-10-CM

## 2017-12-19 DIAGNOSIS — E43 Unspecified severe protein-calorie malnutrition: Secondary | ICD-10-CM | POA: Diagnosis not present

## 2017-12-19 DIAGNOSIS — S32592S Other specified fracture of left pubis, sequela: Secondary | ICD-10-CM

## 2017-12-19 NOTE — Progress Notes (Signed)
This is a discharge note.  Level care skilled.  Facility is [facility].  Chief complaint-discharge note  History of present illness.  Patient is a pleasant 81 year old male who is here for rehab after sustaining a fall with a left pubic rami fracture as well as a horizontal fracture of the left greater trochanter and minimally displaced L3 superior endplate fracture.  He was hospitalized at [facility] from November 15 - November 21.  He has multiple medical problems including history of colitis with chronic diarrhea-coronary artery disease with stenting-rheumatoid arthritis-peripheral neuropathy- gout-history of cirrhosis as well as protein calorie malnutrition at one point had a PEG tube but this was actually removed earlier this we secondary to inadequate p.o. intake he also has a history of noninflammatory  Neuromyopathy  He also has a history of thrombocytopenia thought secondary to his cirrhosis-his heparin products were stopped in the hospital.  In the.  Platelet level has stabilized most recently 95,000 on lab done in late November will update this before discharge.  Is also completed a short course of Diflucan for esophageal candidiasis which appears to have resolved.  He has done quite well during his stay here --is not really complaining of pain does have Tylenol and tramadol as needed  He is also on Mucinex for complaints of somewhat chronic sinus drainage apparently this is helping.  He is not complaining of any increased cough or shortness of breath today vital signs appear to be stable continues to be in good spirits looking forward to going home which is apparently going to occur on Christmas Day       Past Medical History:  Diagnosis Date  . Collagenous colitis   . Coronary atherosclerosis of native coronary artery    DES RCA 5/11, LVEF 55%  . Essential hypertension, benign   . GERD (gastroesophageal reflux disease)   . Hiatal hernia     . Mixed hyperlipidemia   . Obstructive sleep apnea   . Prostate cancer ([place])   . Pulmonary nodules   . Rheumatoid arthritis [facility])         Past Surgical History:  Procedure Laterality Date  . APPENDECTOMY    . COLONOSCOPY  06/2016   Dr. Britta Mccreedy: mild diverticulosis in left colon, sessile polyp 3-7 mm in distal descending colon, biopsies of colon to assess for microscopic colitis. path: collagenous colitis. colon polyp was benign polypoid colorectal mucosa   . COLONOSCOPY  04/2014   Dr. Britta Mccreedy: office notes stated 3 tubular adenomas and diverticulosis of left colon   . CORONARY ANGIOPLASTY WITH STENT PLACEMENT  2011  . ESOPHAGOGASTRODUODENOSCOPY  06/2016   Dr. Britta Mccreedy: gastritis, path: chronic active gastritis, negative H.pylori   . HEMORRHOID SURGERY    . LAPAROSCOPIC CHOLECYSTECTOMY    . PROSTATECTOMY    . Skin cancer resection      reports that he quit smoking about 25 years ago. His smoking use included cigarettes. He started smoking about 65 years ago. He has a 40.00 pack-year smoking history. he has never used smokeless tobacco. He reports that he drinks alcohol. He reports that he does not use drugs. Social History   Socioeconomic History  . Marital status: Married    Spouse name: Not on file  . Number of children: Not on file  . Years of education: Not on file  . Highest education level: Not on file  Social Needs  . Financial resource strain: Not on file  . Food insecurity - worry: Not on  file  . Food insecurity - inability: Not on file  . Transportation needs - medical: Not on file  . Transportation needs - non-medical: Not on file  Occupational History  . Not on file  Tobacco Use  . Smoking status: Former Smoker    Packs/day: 1.00    Years: 40.00    Pack years: 40.00    Types: Cigarettes    Start date: 12/30/1951    Last attempt to quit: 12/30/1991    Years since quitting: 25.9  . Smokeless tobacco: Never Used  Substance  and Sexual Activity  . Alcohol use: Yes    Alcohol/week: 0.0 oz    Comment: 1 happy hour beer a day   . Drug use: No  . Sexual activity: Not on file  Other Topics Concern  . Not on file  Social History Narrative  . Not on file    Functional Status Survey:       Family History  Problem Relation Age of Onset  . Colon cancer Mother 64  . Hypertension Unknown     Health Maintenance  Topic Date Due  . INFLUENZA VACCINE  11/28/2017 (Originally 07/29/2017)  . TETANUS/TDAP  11/28/2017 (Originally 10/31/1955)  . PNA vac Low Risk Adult (1 of 2 - PCV13) 11/28/2017 (Originally 11/01/2003)         Allergies  Allergen Reactions  . Cefprozil   . Doxycycline Hives and Itching  . Pineapple     Other reaction(s): Bleeding (intolerance) Mouth burns and bleeds  . Zenpep [Pancrelipase (Lip-Prot-Amyl)]     Patient does not recall reaction but states he had to stop because it was "bad". I have listed in allergies, as we are not able to determine what this was.   . Levofloxacin Rash    Unknown REACTION: rash  . Penicillins Rash    REACTION: hives  . Statins Other (See Comments) and Rash    aching aching        Allergies as of 11/23/2017      Reactions   Cefprozil    Doxycycline Hives, Itching   Pineapple    Other reaction(s): Bleeding (intolerance) Mouth burns and bleeds   Zenpep [pancrelipase (lip-prot-amyl)]    Patient does not recall reaction but states he had to stop because it was "bad". I have listed in allergies, as we are not able to determine what this was.    Levofloxacin Rash   Unknown REACTION: rash   Penicillins Rash   REACTION: hives   Statins Other (See Comments), Rash   aching aching                Medications.  Tylenol 500 mg every 8 hours.  Aldactone 25 mg daily.  Artificial tears as needed.  Aspirin 81 mg a day.  Budesonide 9 mg daily.  Calcium carbonate 250-125 mg daily.  Vitamin D3 1 tablet  1000 units daily.  Colchicine 0.6 mg twice daily   vitamin B12 1000 mcg daily.  Evoxac 30 mg 3 times daily.  Folic acid 1 mg every morning.  Lopressor 12.5 mg twice daily.  Mucinex 600 mg twice daily for 5 days to end on December 21, 2017.  Multivitamin daily.  Neurontin 600 mg 3 times daily.  Nitrostat 0.4 mg as needed chest pain.  Protonix 40 mg twice daily.  Temovate cream 0.05% twice a day as needed.  Tramadol 50 mg every 8 hours as needed   allopurinol 100 mg twice daily   Review of systems.  In  general is not complaining of any fever or chills.  Skin does not complain of any rashes or itching.  Head ears eyes nose mouth and throat does not complain of any sore throat or visual changes.  Respiratory denies shortness of breath or cough at this time does have some sinus drainage apparently Mucinex is helping.  Cardiac does not complain chest pain has some mild lower extremity edema which if anything appears to be improved.  GI is not complaining of abdominal discomfort nausea vomiting diarrhea constipation does have a history of cirrhosis.  GU does not complain of dysuria.  Musculoskeletal has a history of fractures as noted above but appears to be doing well pain is controlled appears to be comfortable.  Neurologic does not complain of dizziness headache or numbness at this point.  Psych is not complaining of overt anxiety or depression continues to be in good spirits.  Physical exam.  He is afebrile pulse is 88 respirations of 18 blood pressure is 106/62  Weight is 145 pounds.  In general this is a pleasant elderly male in no distress sitting comfortably in his wheelchair.  His skin is warm and dry.  Oropharynx is clear mucous membranes moist.  Chest is clear to auscultation there is no labored breathing.  Heart is regular rate and rhythm with occasional irregular beats- without murmur gallop or rub-he has trace lower extremity edema  bilaterally.  Abdomen is somewhat protuberant but soft nontender with positive bowel sounds- there is covering over the previous PEG site insertion.  Musculoskeletal does move all extremities x4 at baseline currently ambulating in wheelchair.  Neurologic is grossly intact his speech is clear he has no lateralizing findings.  Psych he is alert and oriented pleasant and appropriate.  Labs.  November 23, 2017.  WBC 7.1 hemoglobin 12.2 platelets 95.  November 20, 2017.  Sodium 136 potassium 4.7 BUN 26 creatinine 0.74.  November 04, 2017.  Sodium 136 potassium 4.1 BUN 29 creatinine 0.83.  Albumin 2.7-otherwise liver function tests appear to be within normal limits.  Assessment and plan. #1-  History of closed trochanteric fracture of the left femur with compression fracture of the third lumbar vertebrae and fracture of the left pubic rami  He appears to be doing well with supportive care recommendation by orthopedics was to continue medical management with pain control he is on Tylenol and tramadol as needed and this appears to be effective-will need continued PT and OT at home by home health.  2.  History of cirrhosis with ascites he is on Aldactone this appears to be stable edema appears to be relatively unchanged if anything improved weight appears to be stable with some slight weight gain which I suspect is appetite related-will update a metabolic panel to make sure electrolytes are stable since he is on.  3 History of thrombocytopenia again this is thought secondary to cirrhosis last lab showed a platelet count of 95,000 which shows relative stability will update this before discharge.  4.  History of protein calorie malnutrition-he is now taking all his nourishment by mouth and PEG tube has been removed much to his pleasure.  He appears to be doing well in this regards--appears to have gained a small amount of weight.  #5 history of coronary artery disease status post stents  this appears stable he has been asymptomatic during his stay here he is on aspirin as well as a beta-blocker does have nitro as needed by do not believe this was used during his stay   #  6- hypertension this appears stable systolics appear to be in the low to mid 100s when I see her recently listed is 136/55-again he is on Lopressor.  7.  History of colitis with recurrent diarrhea this is not really been an issue now in some time appears to be resolved.  8.  History of GERD he is on Protonix this is been stable.  9.  Peripheral neuropathy continues on Neurontin 3 times a day is not currently complaining of overt symptoms--does have a history of progressive weakness with neuropathy he will need neurology follow-up at [facility]-- will defer to primary care provider  #10 history of gout she continues on allopurinol as well as colchicine this appears stable currently as well have been an issue on during his previous stay.  .  #11-history of prostate cancer he is status post prostatectomy he appears to be doing well in this regards  #12Collagenous colitis--stable on Budesonide    Will update again a CBC with his history of thrombocytopenia as well as a metabolic panel to ensure stability of renal function and electrolytes.  He will be going home with his wife who is very supportive he will need primary care follow-up as well as follow-up [facility]  Will update a CBC and CMP before discharge she will need PT and OT at home for further strengthening as well as home health support for his multiple medical issues.  [id number] note greater than 30 minutes spent on this discharge summary- greater than 50% of time spent coordinating plan of care for numerous diagnoses

## 2017-12-20 ENCOUNTER — Encounter (HOSPITAL_COMMUNITY)
Admission: RE | Admit: 2017-12-20 | Discharge: 2017-12-20 | Disposition: A | Discharge status: Home / Self Care | Payer: Medicare Other | Source: Skilled Nursing Facility | Attending: Internal Medicine | Admitting: Internal Medicine

## 2017-12-20 DIAGNOSIS — K52831 Collagenous colitis: Secondary | ICD-10-CM | POA: Insufficient documentation

## 2017-12-20 DIAGNOSIS — A419 Sepsis, unspecified organism: Secondary | ICD-10-CM | POA: Insufficient documentation

## 2017-12-20 DIAGNOSIS — E43 Unspecified severe protein-calorie malnutrition: Secondary | ICD-10-CM | POA: Insufficient documentation

## 2017-12-20 DIAGNOSIS — Z5189 Encounter for other specified aftercare: Secondary | ICD-10-CM | POA: Insufficient documentation

## 2017-12-20 DIAGNOSIS — J90 Pleural effusion, not elsewhere classified: Secondary | ICD-10-CM | POA: Insufficient documentation

## 2017-12-20 LAB — CBC WITH DIFFERENTIAL/PLATELET
Basophils Absolute: 0 10*3/uL (ref 0.0–0.1)
Basophils Relative: 0 %
Eosinophils Absolute: 0 10*3/uL (ref 0.0–0.7)
Eosinophils Relative: 0 %
HCT: 40.9 % (ref 39.0–52.0)
HEMOGLOBIN: 12.8 g/dL — AB (ref 13.0–17.0)
Lymphocytes Relative: 29 %
Lymphs Abs: 2.2 10*3/uL (ref 0.7–4.0)
MCH: 30.8 pg (ref 26.0–34.0)
MCHC: 31.3 g/dL (ref 30.0–36.0)
MCV: 98.3 fL (ref 78.0–100.0)
Monocytes Absolute: 0.8 10*3/uL (ref 0.1–1.0)
Monocytes Relative: 10 %
NEUTROS PCT: 61 %
Neutro Abs: 4.6 10*3/uL (ref 1.7–7.7)
Platelets: 92 10*3/uL — ABNORMAL LOW (ref 150–400)
RBC: 4.16 MIL/uL — AB (ref 4.22–5.81)
RDW: 17 % — ABNORMAL HIGH (ref 11.5–15.5)
WBC: 7.6 10*3/uL (ref 4.0–10.5)

## 2017-12-20 LAB — COMPREHENSIVE METABOLIC PANEL
ALK PHOS: 175 U/L — AB (ref 38–126)
ALT: 45 U/L (ref 17–63)
AST: 33 U/L (ref 15–41)
Albumin: 2.9 g/dL — ABNORMAL LOW (ref 3.5–5.0)
Anion gap: 10 (ref 5–15)
BUN: 27 mg/dL — ABNORMAL HIGH (ref 6–20)
CALCIUM: 8.8 mg/dL — AB (ref 8.9–10.3)
CO2: 26 mmol/L (ref 22–32)
CREATININE: 0.85 mg/dL (ref 0.61–1.24)
Chloride: 105 mmol/L (ref 101–111)
GFR calc Af Amer: >60 mL/min (ref >60)
Glucose, Bld: 144 mg/dL — ABNORMAL HIGH (ref 65–99)
Potassium: 4.3 mmol/L (ref 3.5–5.1)
SODIUM: 141 mmol/L (ref 135–145)
Total Bilirubin: 0.6 mg/dL (ref 0.3–1.2)
Total Protein: 5.8 g/dL — ABNORMAL LOW (ref 6.5–8.1)

## 2017-12-24 DIAGNOSIS — K7460 Unspecified cirrhosis of liver: Secondary | ICD-10-CM | POA: Diagnosis not present

## 2017-12-24 DIAGNOSIS — L89322 Pressure ulcer of left buttock, stage 2: Secondary | ICD-10-CM | POA: Diagnosis not present

## 2017-12-24 DIAGNOSIS — G629 Polyneuropathy, unspecified: Secondary | ICD-10-CM | POA: Diagnosis not present

## 2017-12-24 DIAGNOSIS — K52831 Collagenous colitis: Secondary | ICD-10-CM | POA: Diagnosis not present

## 2017-12-24 DIAGNOSIS — K766 Portal hypertension: Secondary | ICD-10-CM | POA: Diagnosis not present

## 2017-12-24 DIAGNOSIS — R1310 Dysphagia, unspecified: Secondary | ICD-10-CM | POA: Diagnosis not present

## 2017-12-25 DIAGNOSIS — K766 Portal hypertension: Secondary | ICD-10-CM | POA: Diagnosis not present

## 2017-12-25 DIAGNOSIS — L89322 Pressure ulcer of left buttock, stage 2: Secondary | ICD-10-CM | POA: Diagnosis not present

## 2017-12-25 DIAGNOSIS — K52831 Collagenous colitis: Secondary | ICD-10-CM | POA: Diagnosis not present

## 2017-12-25 DIAGNOSIS — R1310 Dysphagia, unspecified: Secondary | ICD-10-CM | POA: Diagnosis not present

## 2017-12-25 DIAGNOSIS — G629 Polyneuropathy, unspecified: Secondary | ICD-10-CM | POA: Diagnosis not present

## 2017-12-25 DIAGNOSIS — K7460 Unspecified cirrhosis of liver: Secondary | ICD-10-CM | POA: Diagnosis not present

## 2017-12-26 DIAGNOSIS — K766 Portal hypertension: Secondary | ICD-10-CM | POA: Diagnosis not present

## 2017-12-26 DIAGNOSIS — R1310 Dysphagia, unspecified: Secondary | ICD-10-CM | POA: Diagnosis not present

## 2017-12-26 DIAGNOSIS — G629 Polyneuropathy, unspecified: Secondary | ICD-10-CM | POA: Diagnosis not present

## 2017-12-26 DIAGNOSIS — K52831 Collagenous colitis: Secondary | ICD-10-CM | POA: Diagnosis not present

## 2017-12-26 DIAGNOSIS — L89322 Pressure ulcer of left buttock, stage 2: Secondary | ICD-10-CM | POA: Diagnosis not present

## 2017-12-26 DIAGNOSIS — K7460 Unspecified cirrhosis of liver: Secondary | ICD-10-CM | POA: Diagnosis not present

## 2017-12-28 DIAGNOSIS — I2510 Atherosclerotic heart disease of native coronary artery without angina pectoris: Secondary | ICD-10-CM | POA: Diagnosis not present

## 2017-12-28 DIAGNOSIS — L89150 Pressure ulcer of sacral region, unstageable: Secondary | ICD-10-CM | POA: Diagnosis not present

## 2017-12-28 DIAGNOSIS — G629 Polyneuropathy, unspecified: Secondary | ICD-10-CM | POA: Diagnosis not present

## 2017-12-28 DIAGNOSIS — G4733 Obstructive sleep apnea (adult) (pediatric): Secondary | ICD-10-CM | POA: Diagnosis not present

## 2017-12-28 DIAGNOSIS — I10 Essential (primary) hypertension: Secondary | ICD-10-CM | POA: Diagnosis not present

## 2017-12-28 DIAGNOSIS — K7460 Unspecified cirrhosis of liver: Secondary | ICD-10-CM | POA: Diagnosis not present

## 2017-12-28 DIAGNOSIS — Z9889 Other specified postprocedural states: Secondary | ICD-10-CM | POA: Diagnosis not present

## 2017-12-28 DIAGNOSIS — K52831 Collagenous colitis: Secondary | ICD-10-CM | POA: Diagnosis not present

## 2017-12-28 DIAGNOSIS — L89322 Pressure ulcer of left buttock, stage 2: Secondary | ICD-10-CM | POA: Diagnosis not present

## 2017-12-28 DIAGNOSIS — L988 Other specified disorders of the skin and subcutaneous tissue: Secondary | ICD-10-CM | POA: Diagnosis not present

## 2017-12-28 DIAGNOSIS — K766 Portal hypertension: Secondary | ICD-10-CM | POA: Diagnosis not present

## 2017-12-28 DIAGNOSIS — Z87891 Personal history of nicotine dependence: Secondary | ICD-10-CM | POA: Diagnosis not present

## 2017-12-28 DIAGNOSIS — K219 Gastro-esophageal reflux disease without esophagitis: Secondary | ICD-10-CM | POA: Diagnosis not present

## 2017-12-28 DIAGNOSIS — K9429 Other complications of gastrostomy: Secondary | ICD-10-CM | POA: Diagnosis not present

## 2017-12-28 DIAGNOSIS — K9420 Gastrostomy complication, unspecified: Secondary | ICD-10-CM | POA: Diagnosis not present

## 2017-12-28 DIAGNOSIS — Z9049 Acquired absence of other specified parts of digestive tract: Secondary | ICD-10-CM | POA: Diagnosis not present

## 2017-12-28 DIAGNOSIS — R1310 Dysphagia, unspecified: Secondary | ICD-10-CM | POA: Diagnosis not present

## 2017-12-30 ENCOUNTER — Telehealth: Payer: Self-pay

## 2017-12-30 DIAGNOSIS — G629 Polyneuropathy, unspecified: Secondary | ICD-10-CM | POA: Diagnosis not present

## 2017-12-30 DIAGNOSIS — K52831 Collagenous colitis: Secondary | ICD-10-CM | POA: Diagnosis not present

## 2017-12-30 DIAGNOSIS — L89322 Pressure ulcer of left buttock, stage 2: Secondary | ICD-10-CM | POA: Diagnosis not present

## 2017-12-30 DIAGNOSIS — R1310 Dysphagia, unspecified: Secondary | ICD-10-CM | POA: Diagnosis not present

## 2017-12-30 DIAGNOSIS — K7460 Unspecified cirrhosis of liver: Secondary | ICD-10-CM | POA: Diagnosis not present

## 2017-12-30 DIAGNOSIS — K766 Portal hypertension: Secondary | ICD-10-CM | POA: Diagnosis not present

## 2017-12-30 NOTE — Telephone Encounter (Signed)
Completed.

## 2017-12-30 NOTE — Telephone Encounter (Signed)
T/C from [facility] in Physical Therapy at [facility]. They were just audited and he will be faxing paperwork to Roseanne Kaufman, NP to sign papers that she did sign papers. Related to notes in June.

## 2017-12-31 DIAGNOSIS — G629 Polyneuropathy, unspecified: Secondary | ICD-10-CM | POA: Diagnosis not present

## 2017-12-31 DIAGNOSIS — R1310 Dysphagia, unspecified: Secondary | ICD-10-CM | POA: Diagnosis not present

## 2017-12-31 DIAGNOSIS — K52831 Collagenous colitis: Secondary | ICD-10-CM | POA: Diagnosis not present

## 2017-12-31 DIAGNOSIS — K766 Portal hypertension: Secondary | ICD-10-CM | POA: Diagnosis not present

## 2017-12-31 DIAGNOSIS — K7460 Unspecified cirrhosis of liver: Secondary | ICD-10-CM | POA: Diagnosis not present

## 2017-12-31 DIAGNOSIS — L89322 Pressure ulcer of left buttock, stage 2: Secondary | ICD-10-CM | POA: Diagnosis not present

## 2018-01-01 DIAGNOSIS — G7281 Critical illness myopathy: Secondary | ICD-10-CM | POA: Diagnosis not present

## 2018-01-01 DIAGNOSIS — R531 Weakness: Secondary | ICD-10-CM | POA: Diagnosis not present

## 2018-01-01 DIAGNOSIS — S32502D Unspecified fracture of left pubis, subsequent encounter for fracture with routine healing: Secondary | ICD-10-CM | POA: Diagnosis not present

## 2018-01-01 DIAGNOSIS — S32030D Wedge compression fracture of third lumbar vertebra, subsequent encounter for fracture with routine healing: Secondary | ICD-10-CM | POA: Diagnosis not present

## 2018-01-01 DIAGNOSIS — S72115D Nondisplaced fracture of greater trochanter of left femur, subsequent encounter for closed fracture with routine healing: Secondary | ICD-10-CM | POA: Diagnosis not present

## 2018-01-01 DIAGNOSIS — M6281 Muscle weakness (generalized): Secondary | ICD-10-CM | POA: Diagnosis not present

## 2018-01-02 DIAGNOSIS — G629 Polyneuropathy, unspecified: Secondary | ICD-10-CM | POA: Diagnosis not present

## 2018-01-02 DIAGNOSIS — R1310 Dysphagia, unspecified: Secondary | ICD-10-CM | POA: Diagnosis not present

## 2018-01-02 DIAGNOSIS — L89322 Pressure ulcer of left buttock, stage 2: Secondary | ICD-10-CM | POA: Diagnosis not present

## 2018-01-02 DIAGNOSIS — K52831 Collagenous colitis: Secondary | ICD-10-CM | POA: Diagnosis not present

## 2018-01-02 DIAGNOSIS — K766 Portal hypertension: Secondary | ICD-10-CM | POA: Diagnosis not present

## 2018-01-02 DIAGNOSIS — K7460 Unspecified cirrhosis of liver: Secondary | ICD-10-CM | POA: Diagnosis not present

## 2018-01-04 DIAGNOSIS — K7460 Unspecified cirrhosis of liver: Secondary | ICD-10-CM | POA: Diagnosis not present

## 2018-01-04 DIAGNOSIS — K52831 Collagenous colitis: Secondary | ICD-10-CM | POA: Diagnosis not present

## 2018-01-04 DIAGNOSIS — G629 Polyneuropathy, unspecified: Secondary | ICD-10-CM | POA: Diagnosis not present

## 2018-01-04 DIAGNOSIS — K766 Portal hypertension: Secondary | ICD-10-CM | POA: Diagnosis not present

## 2018-01-04 DIAGNOSIS — L89322 Pressure ulcer of left buttock, stage 2: Secondary | ICD-10-CM | POA: Diagnosis not present

## 2018-01-04 DIAGNOSIS — R1310 Dysphagia, unspecified: Secondary | ICD-10-CM | POA: Diagnosis not present

## 2018-01-05 DIAGNOSIS — R1310 Dysphagia, unspecified: Secondary | ICD-10-CM | POA: Diagnosis not present

## 2018-01-05 DIAGNOSIS — K52831 Collagenous colitis: Secondary | ICD-10-CM | POA: Diagnosis not present

## 2018-01-05 DIAGNOSIS — L89322 Pressure ulcer of left buttock, stage 2: Secondary | ICD-10-CM | POA: Diagnosis not present

## 2018-01-05 DIAGNOSIS — K7460 Unspecified cirrhosis of liver: Secondary | ICD-10-CM | POA: Diagnosis not present

## 2018-01-05 DIAGNOSIS — G629 Polyneuropathy, unspecified: Secondary | ICD-10-CM | POA: Diagnosis not present

## 2018-01-05 DIAGNOSIS — K766 Portal hypertension: Secondary | ICD-10-CM | POA: Diagnosis not present

## 2018-01-06 DIAGNOSIS — S32030D Wedge compression fracture of third lumbar vertebra, subsequent encounter for fracture with routine healing: Secondary | ICD-10-CM | POA: Diagnosis not present

## 2018-01-06 DIAGNOSIS — Z955 Presence of coronary angioplasty implant and graft: Secondary | ICD-10-CM | POA: Diagnosis not present

## 2018-01-06 DIAGNOSIS — D696 Thrombocytopenia, unspecified: Secondary | ICD-10-CM | POA: Diagnosis not present

## 2018-01-06 DIAGNOSIS — M109 Gout, unspecified: Secondary | ICD-10-CM | POA: Diagnosis not present

## 2018-01-06 DIAGNOSIS — K1230 Oral mucositis (ulcerative), unspecified: Secondary | ICD-10-CM | POA: Diagnosis not present

## 2018-01-06 DIAGNOSIS — Z6823 Body mass index (BMI) 23.0-23.9, adult: Secondary | ICD-10-CM | POA: Diagnosis not present

## 2018-01-06 DIAGNOSIS — Z87891 Personal history of nicotine dependence: Secondary | ICD-10-CM | POA: Diagnosis not present

## 2018-01-06 DIAGNOSIS — D649 Anemia, unspecified: Secondary | ICD-10-CM | POA: Diagnosis not present

## 2018-01-06 DIAGNOSIS — M069 Rheumatoid arthritis, unspecified: Secondary | ICD-10-CM | POA: Diagnosis not present

## 2018-01-06 DIAGNOSIS — S32502D Unspecified fracture of left pubis, subsequent encounter for fracture with routine healing: Secondary | ICD-10-CM | POA: Diagnosis not present

## 2018-01-06 DIAGNOSIS — S72112D Displaced fracture of greater trochanter of left femur, subsequent encounter for closed fracture with routine healing: Secondary | ICD-10-CM | POA: Diagnosis not present

## 2018-01-06 DIAGNOSIS — E440 Moderate protein-calorie malnutrition: Secondary | ICD-10-CM | POA: Diagnosis not present

## 2018-01-06 DIAGNOSIS — G629 Polyneuropathy, unspecified: Secondary | ICD-10-CM | POA: Diagnosis not present

## 2018-01-06 DIAGNOSIS — R1310 Dysphagia, unspecified: Secondary | ICD-10-CM | POA: Diagnosis not present

## 2018-01-06 DIAGNOSIS — J0101 Acute recurrent maxillary sinusitis: Secondary | ICD-10-CM | POA: Diagnosis not present

## 2018-01-06 DIAGNOSIS — Z8546 Personal history of malignant neoplasm of prostate: Secondary | ICD-10-CM | POA: Diagnosis not present

## 2018-01-06 DIAGNOSIS — I10 Essential (primary) hypertension: Secondary | ICD-10-CM | POA: Diagnosis not present

## 2018-01-06 DIAGNOSIS — W19XXXD Unspecified fall, subsequent encounter: Secondary | ICD-10-CM | POA: Diagnosis not present

## 2018-01-06 DIAGNOSIS — L439 Lichen planus, unspecified: Secondary | ICD-10-CM | POA: Diagnosis not present

## 2018-01-06 DIAGNOSIS — K766 Portal hypertension: Secondary | ICD-10-CM | POA: Diagnosis not present

## 2018-01-06 DIAGNOSIS — L89323 Pressure ulcer of left buttock, stage 3: Secondary | ICD-10-CM | POA: Diagnosis not present

## 2018-01-06 DIAGNOSIS — I2510 Atherosclerotic heart disease of native coronary artery without angina pectoris: Secondary | ICD-10-CM | POA: Diagnosis not present

## 2018-01-06 DIAGNOSIS — Z85828 Personal history of other malignant neoplasm of skin: Secondary | ICD-10-CM | POA: Diagnosis not present

## 2018-01-06 DIAGNOSIS — K7460 Unspecified cirrhosis of liver: Secondary | ICD-10-CM | POA: Diagnosis not present

## 2018-01-06 DIAGNOSIS — K52831 Collagenous colitis: Secondary | ICD-10-CM | POA: Diagnosis not present

## 2018-01-06 DIAGNOSIS — R188 Other ascites: Secondary | ICD-10-CM | POA: Diagnosis not present

## 2018-01-06 DIAGNOSIS — G4733 Obstructive sleep apnea (adult) (pediatric): Secondary | ICD-10-CM | POA: Diagnosis not present

## 2018-01-06 DIAGNOSIS — M545 Low back pain: Secondary | ICD-10-CM | POA: Diagnosis not present

## 2018-01-06 DIAGNOSIS — Z9079 Acquired absence of other genital organ(s): Secondary | ICD-10-CM | POA: Diagnosis not present

## 2018-01-07 DIAGNOSIS — G629 Polyneuropathy, unspecified: Secondary | ICD-10-CM | POA: Diagnosis not present

## 2018-01-07 DIAGNOSIS — S32502D Unspecified fracture of left pubis, subsequent encounter for fracture with routine healing: Secondary | ICD-10-CM | POA: Diagnosis not present

## 2018-01-07 DIAGNOSIS — K52831 Collagenous colitis: Secondary | ICD-10-CM | POA: Diagnosis not present

## 2018-01-07 DIAGNOSIS — S72112D Displaced fracture of greater trochanter of left femur, subsequent encounter for closed fracture with routine healing: Secondary | ICD-10-CM | POA: Diagnosis not present

## 2018-01-07 DIAGNOSIS — L89323 Pressure ulcer of left buttock, stage 3: Secondary | ICD-10-CM | POA: Diagnosis not present

## 2018-01-07 DIAGNOSIS — S32030D Wedge compression fracture of third lumbar vertebra, subsequent encounter for fracture with routine healing: Secondary | ICD-10-CM | POA: Diagnosis not present

## 2018-01-07 NOTE — Progress Notes (Deleted)
This encounter was created in error - please disregard.

## 2018-01-08 DIAGNOSIS — K52831 Collagenous colitis: Secondary | ICD-10-CM | POA: Diagnosis not present

## 2018-01-08 DIAGNOSIS — S72112D Displaced fracture of greater trochanter of left femur, subsequent encounter for closed fracture with routine healing: Secondary | ICD-10-CM | POA: Diagnosis not present

## 2018-01-08 DIAGNOSIS — G629 Polyneuropathy, unspecified: Secondary | ICD-10-CM | POA: Diagnosis not present

## 2018-01-08 DIAGNOSIS — S32030D Wedge compression fracture of third lumbar vertebra, subsequent encounter for fracture with routine healing: Secondary | ICD-10-CM | POA: Diagnosis not present

## 2018-01-08 DIAGNOSIS — L89323 Pressure ulcer of left buttock, stage 3: Secondary | ICD-10-CM | POA: Diagnosis not present

## 2018-01-08 DIAGNOSIS — S32502D Unspecified fracture of left pubis, subsequent encounter for fracture with routine healing: Secondary | ICD-10-CM | POA: Diagnosis not present

## 2018-01-12 DIAGNOSIS — M545 Low back pain: Secondary | ICD-10-CM | POA: Diagnosis not present

## 2018-01-12 DIAGNOSIS — Z6823 Body mass index (BMI) 23.0-23.9, adult: Secondary | ICD-10-CM | POA: Diagnosis not present

## 2018-01-12 DIAGNOSIS — S32502D Unspecified fracture of left pubis, subsequent encounter for fracture with routine healing: Secondary | ICD-10-CM | POA: Diagnosis not present

## 2018-01-12 DIAGNOSIS — L89103 Pressure ulcer of unspecified part of back, stage 3: Secondary | ICD-10-CM | POA: Diagnosis not present

## 2018-01-12 DIAGNOSIS — K52831 Collagenous colitis: Secondary | ICD-10-CM | POA: Diagnosis not present

## 2018-01-12 DIAGNOSIS — J0101 Acute recurrent maxillary sinusitis: Secondary | ICD-10-CM | POA: Diagnosis not present

## 2018-01-12 DIAGNOSIS — G629 Polyneuropathy, unspecified: Secondary | ICD-10-CM | POA: Diagnosis not present

## 2018-01-12 DIAGNOSIS — L89323 Pressure ulcer of left buttock, stage 3: Secondary | ICD-10-CM | POA: Diagnosis not present

## 2018-01-12 DIAGNOSIS — S72112D Displaced fracture of greater trochanter of left femur, subsequent encounter for closed fracture with routine healing: Secondary | ICD-10-CM | POA: Diagnosis not present

## 2018-01-12 DIAGNOSIS — S32030D Wedge compression fracture of third lumbar vertebra, subsequent encounter for fracture with routine healing: Secondary | ICD-10-CM | POA: Diagnosis not present

## 2018-01-12 DIAGNOSIS — E440 Moderate protein-calorie malnutrition: Secondary | ICD-10-CM | POA: Diagnosis not present

## 2018-01-12 NOTE — Progress Notes (Deleted)
This encounter was created in error - please disregard.

## 2018-01-13 DIAGNOSIS — L89323 Pressure ulcer of left buttock, stage 3: Secondary | ICD-10-CM | POA: Diagnosis not present

## 2018-01-13 DIAGNOSIS — G629 Polyneuropathy, unspecified: Secondary | ICD-10-CM | POA: Diagnosis not present

## 2018-01-13 DIAGNOSIS — S32030D Wedge compression fracture of third lumbar vertebra, subsequent encounter for fracture with routine healing: Secondary | ICD-10-CM | POA: Diagnosis not present

## 2018-01-13 DIAGNOSIS — S32502D Unspecified fracture of left pubis, subsequent encounter for fracture with routine healing: Secondary | ICD-10-CM | POA: Diagnosis not present

## 2018-01-13 DIAGNOSIS — S72112D Displaced fracture of greater trochanter of left femur, subsequent encounter for closed fracture with routine healing: Secondary | ICD-10-CM | POA: Diagnosis not present

## 2018-01-13 DIAGNOSIS — K52831 Collagenous colitis: Secondary | ICD-10-CM | POA: Diagnosis not present

## 2018-01-14 DIAGNOSIS — L89323 Pressure ulcer of left buttock, stage 3: Secondary | ICD-10-CM | POA: Diagnosis not present

## 2018-01-14 DIAGNOSIS — S32030D Wedge compression fracture of third lumbar vertebra, subsequent encounter for fracture with routine healing: Secondary | ICD-10-CM | POA: Diagnosis not present

## 2018-01-14 DIAGNOSIS — G629 Polyneuropathy, unspecified: Secondary | ICD-10-CM | POA: Diagnosis not present

## 2018-01-14 DIAGNOSIS — K52831 Collagenous colitis: Secondary | ICD-10-CM | POA: Diagnosis not present

## 2018-01-14 DIAGNOSIS — S32502D Unspecified fracture of left pubis, subsequent encounter for fracture with routine healing: Secondary | ICD-10-CM | POA: Diagnosis not present

## 2018-01-14 DIAGNOSIS — S72112D Displaced fracture of greater trochanter of left femur, subsequent encounter for closed fracture with routine healing: Secondary | ICD-10-CM | POA: Diagnosis not present

## 2018-01-15 DIAGNOSIS — J320 Chronic maxillary sinusitis: Secondary | ICD-10-CM | POA: Diagnosis not present

## 2018-01-16 DIAGNOSIS — J320 Chronic maxillary sinusitis: Secondary | ICD-10-CM | POA: Diagnosis not present

## 2018-01-19 DIAGNOSIS — L89323 Pressure ulcer of left buttock, stage 3: Secondary | ICD-10-CM | POA: Diagnosis not present

## 2018-01-19 DIAGNOSIS — S32030D Wedge compression fracture of third lumbar vertebra, subsequent encounter for fracture with routine healing: Secondary | ICD-10-CM | POA: Diagnosis not present

## 2018-01-19 DIAGNOSIS — G629 Polyneuropathy, unspecified: Secondary | ICD-10-CM | POA: Diagnosis not present

## 2018-01-19 DIAGNOSIS — K52831 Collagenous colitis: Secondary | ICD-10-CM | POA: Diagnosis not present

## 2018-01-19 DIAGNOSIS — S72112D Displaced fracture of greater trochanter of left femur, subsequent encounter for closed fracture with routine healing: Secondary | ICD-10-CM | POA: Diagnosis not present

## 2018-01-19 DIAGNOSIS — S32502D Unspecified fracture of left pubis, subsequent encounter for fracture with routine healing: Secondary | ICD-10-CM | POA: Diagnosis not present

## 2018-01-20 DIAGNOSIS — L89323 Pressure ulcer of left buttock, stage 3: Secondary | ICD-10-CM | POA: Diagnosis not present

## 2018-01-20 DIAGNOSIS — S32030D Wedge compression fracture of third lumbar vertebra, subsequent encounter for fracture with routine healing: Secondary | ICD-10-CM | POA: Diagnosis not present

## 2018-01-20 DIAGNOSIS — G629 Polyneuropathy, unspecified: Secondary | ICD-10-CM | POA: Diagnosis not present

## 2018-01-20 DIAGNOSIS — K52831 Collagenous colitis: Secondary | ICD-10-CM | POA: Diagnosis not present

## 2018-01-20 DIAGNOSIS — S32502D Unspecified fracture of left pubis, subsequent encounter for fracture with routine healing: Secondary | ICD-10-CM | POA: Diagnosis not present

## 2018-01-20 DIAGNOSIS — S72112D Displaced fracture of greater trochanter of left femur, subsequent encounter for closed fracture with routine healing: Secondary | ICD-10-CM | POA: Diagnosis not present

## 2018-01-22 DIAGNOSIS — R899 Unspecified abnormal finding in specimens from other organs, systems and tissues: Secondary | ICD-10-CM | POA: Diagnosis not present

## 2018-01-22 DIAGNOSIS — Z87891 Personal history of nicotine dependence: Secondary | ICD-10-CM | POA: Diagnosis not present

## 2018-01-22 DIAGNOSIS — Z9989 Dependence on other enabling machines and devices: Secondary | ICD-10-CM | POA: Diagnosis not present

## 2018-01-22 DIAGNOSIS — Z955 Presence of coronary angioplasty implant and graft: Secondary | ICD-10-CM | POA: Diagnosis not present

## 2018-01-22 DIAGNOSIS — M6281 Muscle weakness (generalized): Secondary | ICD-10-CM | POA: Diagnosis not present

## 2018-01-22 DIAGNOSIS — R197 Diarrhea, unspecified: Secondary | ICD-10-CM | POA: Diagnosis not present

## 2018-01-22 DIAGNOSIS — G729 Myopathy, unspecified: Secondary | ICD-10-CM | POA: Diagnosis not present

## 2018-01-22 DIAGNOSIS — Z8719 Personal history of other diseases of the digestive system: Secondary | ICD-10-CM | POA: Diagnosis not present

## 2018-01-22 DIAGNOSIS — K52831 Collagenous colitis: Secondary | ICD-10-CM | POA: Diagnosis not present

## 2018-01-22 DIAGNOSIS — G608 Other hereditary and idiopathic neuropathies: Secondary | ICD-10-CM | POA: Diagnosis not present

## 2018-01-22 DIAGNOSIS — G4733 Obstructive sleep apnea (adult) (pediatric): Secondary | ICD-10-CM | POA: Diagnosis not present

## 2018-01-22 DIAGNOSIS — Z9079 Acquired absence of other genital organ(s): Secondary | ICD-10-CM | POA: Diagnosis not present

## 2018-01-22 DIAGNOSIS — R627 Adult failure to thrive: Secondary | ICD-10-CM | POA: Diagnosis not present

## 2018-01-22 DIAGNOSIS — G629 Polyneuropathy, unspecified: Secondary | ICD-10-CM | POA: Diagnosis not present

## 2018-01-22 DIAGNOSIS — Z8546 Personal history of malignant neoplasm of prostate: Secondary | ICD-10-CM | POA: Diagnosis not present

## 2018-01-22 DIAGNOSIS — I2510 Atherosclerotic heart disease of native coronary artery without angina pectoris: Secondary | ICD-10-CM | POA: Diagnosis not present

## 2018-01-25 DIAGNOSIS — M48061 Spinal stenosis, lumbar region without neurogenic claudication: Secondary | ICD-10-CM | POA: Diagnosis not present

## 2018-01-25 DIAGNOSIS — M545 Low back pain: Secondary | ICD-10-CM | POA: Diagnosis not present

## 2018-01-25 DIAGNOSIS — M4856XA Collapsed vertebra, not elsewhere classified, lumbar region, initial encounter for fracture: Secondary | ICD-10-CM | POA: Diagnosis not present

## 2018-01-25 DIAGNOSIS — M47816 Spondylosis without myelopathy or radiculopathy, lumbar region: Secondary | ICD-10-CM | POA: Diagnosis not present

## 2018-01-26 DIAGNOSIS — S72112D Displaced fracture of greater trochanter of left femur, subsequent encounter for closed fracture with routine healing: Secondary | ICD-10-CM | POA: Diagnosis not present

## 2018-01-26 DIAGNOSIS — S32502D Unspecified fracture of left pubis, subsequent encounter for fracture with routine healing: Secondary | ICD-10-CM | POA: Diagnosis not present

## 2018-01-26 DIAGNOSIS — S32030D Wedge compression fracture of third lumbar vertebra, subsequent encounter for fracture with routine healing: Secondary | ICD-10-CM | POA: Diagnosis not present

## 2018-01-26 DIAGNOSIS — G629 Polyneuropathy, unspecified: Secondary | ICD-10-CM | POA: Diagnosis not present

## 2018-01-26 DIAGNOSIS — K52831 Collagenous colitis: Secondary | ICD-10-CM | POA: Diagnosis not present

## 2018-01-26 DIAGNOSIS — L89323 Pressure ulcer of left buttock, stage 3: Secondary | ICD-10-CM | POA: Diagnosis not present

## 2018-01-27 DIAGNOSIS — Z8546 Personal history of malignant neoplasm of prostate: Secondary | ICD-10-CM | POA: Diagnosis not present

## 2018-01-27 DIAGNOSIS — R5383 Other fatigue: Secondary | ICD-10-CM | POA: Diagnosis not present

## 2018-01-27 DIAGNOSIS — R531 Weakness: Secondary | ICD-10-CM | POA: Diagnosis not present

## 2018-01-27 DIAGNOSIS — S32030D Wedge compression fracture of third lumbar vertebra, subsequent encounter for fracture with routine healing: Secondary | ICD-10-CM | POA: Diagnosis not present

## 2018-01-27 DIAGNOSIS — M5136 Other intervertebral disc degeneration, lumbar region: Secondary | ICD-10-CM | POA: Diagnosis not present

## 2018-01-27 DIAGNOSIS — M609 Myositis, unspecified: Secondary | ICD-10-CM | POA: Diagnosis not present

## 2018-01-27 DIAGNOSIS — S32020D Wedge compression fracture of second lumbar vertebra, subsequent encounter for fracture with routine healing: Secondary | ICD-10-CM | POA: Diagnosis not present

## 2018-01-27 DIAGNOSIS — M4316 Spondylolisthesis, lumbar region: Secondary | ICD-10-CM | POA: Diagnosis not present

## 2018-01-27 DIAGNOSIS — Z9181 History of falling: Secondary | ICD-10-CM | POA: Diagnosis not present

## 2018-01-28 DIAGNOSIS — S32502D Unspecified fracture of left pubis, subsequent encounter for fracture with routine healing: Secondary | ICD-10-CM | POA: Diagnosis not present

## 2018-01-28 DIAGNOSIS — S32030D Wedge compression fracture of third lumbar vertebra, subsequent encounter for fracture with routine healing: Secondary | ICD-10-CM | POA: Diagnosis not present

## 2018-01-28 DIAGNOSIS — G629 Polyneuropathy, unspecified: Secondary | ICD-10-CM | POA: Diagnosis not present

## 2018-01-28 DIAGNOSIS — L89323 Pressure ulcer of left buttock, stage 3: Secondary | ICD-10-CM | POA: Diagnosis not present

## 2018-01-28 DIAGNOSIS — K52831 Collagenous colitis: Secondary | ICD-10-CM | POA: Diagnosis not present

## 2018-01-28 DIAGNOSIS — S72112D Displaced fracture of greater trochanter of left femur, subsequent encounter for closed fracture with routine healing: Secondary | ICD-10-CM | POA: Diagnosis not present

## 2018-01-29 DIAGNOSIS — G629 Polyneuropathy, unspecified: Secondary | ICD-10-CM | POA: Diagnosis not present

## 2018-01-29 DIAGNOSIS — K52831 Collagenous colitis: Secondary | ICD-10-CM | POA: Diagnosis not present

## 2018-01-29 DIAGNOSIS — S32030D Wedge compression fracture of third lumbar vertebra, subsequent encounter for fracture with routine healing: Secondary | ICD-10-CM | POA: Diagnosis not present

## 2018-01-29 DIAGNOSIS — L89323 Pressure ulcer of left buttock, stage 3: Secondary | ICD-10-CM | POA: Diagnosis not present

## 2018-01-29 DIAGNOSIS — S72112D Displaced fracture of greater trochanter of left femur, subsequent encounter for closed fracture with routine healing: Secondary | ICD-10-CM | POA: Diagnosis not present

## 2018-01-29 DIAGNOSIS — S32502D Unspecified fracture of left pubis, subsequent encounter for fracture with routine healing: Secondary | ICD-10-CM | POA: Diagnosis not present

## 2018-01-30 ENCOUNTER — Other Ambulatory Visit: Payer: Self-pay | Admitting: Cardiology

## 2018-02-01 DIAGNOSIS — T40605A Adverse effect of unspecified narcotics, initial encounter: Secondary | ICD-10-CM | POA: Diagnosis not present

## 2018-02-01 DIAGNOSIS — M549 Dorsalgia, unspecified: Secondary | ICD-10-CM | POA: Diagnosis not present

## 2018-02-01 DIAGNOSIS — K5900 Constipation, unspecified: Secondary | ICD-10-CM | POA: Diagnosis not present

## 2018-02-01 DIAGNOSIS — Z8719 Personal history of other diseases of the digestive system: Secondary | ICD-10-CM | POA: Diagnosis not present

## 2018-02-01 DIAGNOSIS — Z79891 Long term (current) use of opiate analgesic: Secondary | ICD-10-CM | POA: Diagnosis not present

## 2018-02-01 DIAGNOSIS — K52831 Collagenous colitis: Secondary | ICD-10-CM | POA: Diagnosis not present

## 2018-02-01 DIAGNOSIS — K5903 Drug induced constipation: Secondary | ICD-10-CM | POA: Diagnosis not present

## 2018-02-02 DIAGNOSIS — R600 Localized edema: Secondary | ICD-10-CM | POA: Diagnosis not present

## 2018-02-02 DIAGNOSIS — S32502D Unspecified fracture of left pubis, subsequent encounter for fracture with routine healing: Secondary | ICD-10-CM | POA: Diagnosis not present

## 2018-02-02 DIAGNOSIS — L89323 Pressure ulcer of left buttock, stage 3: Secondary | ICD-10-CM | POA: Diagnosis not present

## 2018-02-02 DIAGNOSIS — E43 Unspecified severe protein-calorie malnutrition: Secondary | ICD-10-CM | POA: Diagnosis not present

## 2018-02-02 DIAGNOSIS — S72112D Displaced fracture of greater trochanter of left femur, subsequent encounter for closed fracture with routine healing: Secondary | ICD-10-CM | POA: Diagnosis not present

## 2018-02-02 DIAGNOSIS — K52831 Collagenous colitis: Secondary | ICD-10-CM | POA: Diagnosis not present

## 2018-02-02 DIAGNOSIS — S32030D Wedge compression fracture of third lumbar vertebra, subsequent encounter for fracture with routine healing: Secondary | ICD-10-CM | POA: Diagnosis not present

## 2018-02-02 DIAGNOSIS — G629 Polyneuropathy, unspecified: Secondary | ICD-10-CM | POA: Diagnosis not present

## 2018-02-03 DIAGNOSIS — K52831 Collagenous colitis: Secondary | ICD-10-CM | POA: Diagnosis not present

## 2018-02-03 DIAGNOSIS — I82432 Acute embolism and thrombosis of left popliteal vein: Secondary | ICD-10-CM | POA: Diagnosis not present

## 2018-02-03 DIAGNOSIS — S32502D Unspecified fracture of left pubis, subsequent encounter for fracture with routine healing: Secondary | ICD-10-CM | POA: Diagnosis not present

## 2018-02-03 DIAGNOSIS — G629 Polyneuropathy, unspecified: Secondary | ICD-10-CM | POA: Diagnosis not present

## 2018-02-03 DIAGNOSIS — S72112D Displaced fracture of greater trochanter of left femur, subsequent encounter for closed fracture with routine healing: Secondary | ICD-10-CM | POA: Diagnosis not present

## 2018-02-03 DIAGNOSIS — I82412 Acute embolism and thrombosis of left femoral vein: Secondary | ICD-10-CM | POA: Diagnosis not present

## 2018-02-03 DIAGNOSIS — S32030D Wedge compression fracture of third lumbar vertebra, subsequent encounter for fracture with routine healing: Secondary | ICD-10-CM | POA: Diagnosis not present

## 2018-02-03 DIAGNOSIS — R600 Localized edema: Secondary | ICD-10-CM | POA: Diagnosis not present

## 2018-02-03 DIAGNOSIS — L89323 Pressure ulcer of left buttock, stage 3: Secondary | ICD-10-CM | POA: Diagnosis not present

## 2018-02-04 DIAGNOSIS — Z7901 Long term (current) use of anticoagulants: Secondary | ICD-10-CM | POA: Diagnosis not present

## 2018-02-04 DIAGNOSIS — R0602 Shortness of breath: Secondary | ICD-10-CM | POA: Diagnosis not present

## 2018-02-04 DIAGNOSIS — Z87891 Personal history of nicotine dependence: Secondary | ICD-10-CM | POA: Diagnosis not present

## 2018-02-04 DIAGNOSIS — I82402 Acute embolism and thrombosis of unspecified deep veins of left lower extremity: Secondary | ICD-10-CM | POA: Diagnosis not present

## 2018-02-04 DIAGNOSIS — J181 Lobar pneumonia, unspecified organism: Secondary | ICD-10-CM | POA: Diagnosis not present

## 2018-02-04 DIAGNOSIS — I10 Essential (primary) hypertension: Secondary | ICD-10-CM | POA: Diagnosis not present

## 2018-02-04 DIAGNOSIS — L299 Pruritus, unspecified: Secondary | ICD-10-CM | POA: Diagnosis not present

## 2018-02-04 DIAGNOSIS — I2692 Saddle embolus of pulmonary artery without acute cor pulmonale: Secondary | ICD-10-CM | POA: Diagnosis not present

## 2018-02-04 DIAGNOSIS — R21 Rash and other nonspecific skin eruption: Secondary | ICD-10-CM | POA: Diagnosis not present

## 2018-02-04 DIAGNOSIS — Z7982 Long term (current) use of aspirin: Secondary | ICD-10-CM | POA: Diagnosis not present

## 2018-02-04 DIAGNOSIS — T7840XA Allergy, unspecified, initial encounter: Secondary | ICD-10-CM | POA: Diagnosis not present

## 2018-02-04 DIAGNOSIS — Z79899 Other long term (current) drug therapy: Secondary | ICD-10-CM | POA: Diagnosis not present

## 2018-02-04 DIAGNOSIS — Z8546 Personal history of malignant neoplasm of prostate: Secondary | ICD-10-CM | POA: Diagnosis not present

## 2018-02-04 DIAGNOSIS — T50905A Adverse effect of unspecified drugs, medicaments and biological substances, initial encounter: Secondary | ICD-10-CM | POA: Diagnosis not present

## 2018-02-04 DIAGNOSIS — G629 Polyneuropathy, unspecified: Secondary | ICD-10-CM | POA: Diagnosis not present

## 2018-02-04 DIAGNOSIS — I2699 Other pulmonary embolism without acute cor pulmonale: Secondary | ICD-10-CM | POA: Diagnosis not present

## 2018-02-04 DIAGNOSIS — K219 Gastro-esophageal reflux disease without esophagitis: Secondary | ICD-10-CM | POA: Diagnosis not present

## 2018-02-05 DIAGNOSIS — K219 Gastro-esophageal reflux disease without esophagitis: Secondary | ICD-10-CM | POA: Diagnosis not present

## 2018-02-05 DIAGNOSIS — S32019D Unspecified fracture of first lumbar vertebra, subsequent encounter for fracture with routine healing: Secondary | ICD-10-CM | POA: Diagnosis not present

## 2018-02-05 DIAGNOSIS — J9601 Acute respiratory failure with hypoxia: Secondary | ICD-10-CM | POA: Diagnosis present

## 2018-02-05 DIAGNOSIS — M5136 Other intervertebral disc degeneration, lumbar region: Secondary | ICD-10-CM | POA: Diagnosis not present

## 2018-02-05 DIAGNOSIS — I6782 Cerebral ischemia: Secondary | ICD-10-CM | POA: Diagnosis not present

## 2018-02-05 DIAGNOSIS — R410 Disorientation, unspecified: Secondary | ICD-10-CM | POA: Diagnosis not present

## 2018-02-05 DIAGNOSIS — R569 Unspecified convulsions: Secondary | ICD-10-CM | POA: Diagnosis not present

## 2018-02-05 DIAGNOSIS — F05 Delirium due to known physiological condition: Secondary | ICD-10-CM | POA: Diagnosis not present

## 2018-02-05 DIAGNOSIS — M069 Rheumatoid arthritis, unspecified: Secondary | ICD-10-CM | POA: Diagnosis present

## 2018-02-05 DIAGNOSIS — R52 Pain, unspecified: Secondary | ICD-10-CM | POA: Diagnosis not present

## 2018-02-05 DIAGNOSIS — S32029D Unspecified fracture of second lumbar vertebra, subsequent encounter for fracture with routine healing: Secondary | ICD-10-CM | POA: Diagnosis not present

## 2018-02-05 DIAGNOSIS — S32502D Unspecified fracture of left pubis, subsequent encounter for fracture with routine healing: Secondary | ICD-10-CM | POA: Diagnosis not present

## 2018-02-05 DIAGNOSIS — E785 Hyperlipidemia, unspecified: Secondary | ICD-10-CM | POA: Diagnosis not present

## 2018-02-05 DIAGNOSIS — I82442 Acute embolism and thrombosis of left tibial vein: Secondary | ICD-10-CM | POA: Diagnosis not present

## 2018-02-05 DIAGNOSIS — I82412 Acute embolism and thrombosis of left femoral vein: Secondary | ICD-10-CM | POA: Diagnosis not present

## 2018-02-05 DIAGNOSIS — R0602 Shortness of breath: Secondary | ICD-10-CM | POA: Diagnosis not present

## 2018-02-05 DIAGNOSIS — E871 Hypo-osmolality and hyponatremia: Secondary | ICD-10-CM | POA: Diagnosis not present

## 2018-02-05 DIAGNOSIS — L89152 Pressure ulcer of sacral region, stage 2: Secondary | ICD-10-CM | POA: Diagnosis present

## 2018-02-05 DIAGNOSIS — R2689 Other abnormalities of gait and mobility: Secondary | ICD-10-CM | POA: Diagnosis not present

## 2018-02-05 DIAGNOSIS — L8990 Pressure ulcer of unspecified site, unspecified stage: Secondary | ICD-10-CM | POA: Diagnosis not present

## 2018-02-05 DIAGNOSIS — G629 Polyneuropathy, unspecified: Secondary | ICD-10-CM | POA: Diagnosis not present

## 2018-02-05 DIAGNOSIS — M47897 Other spondylosis, lumbosacral region: Secondary | ICD-10-CM | POA: Diagnosis not present

## 2018-02-05 DIAGNOSIS — M438X6 Other specified deforming dorsopathies, lumbar region: Secondary | ICD-10-CM | POA: Diagnosis not present

## 2018-02-05 DIAGNOSIS — Z7409 Other reduced mobility: Secondary | ICD-10-CM | POA: Diagnosis not present

## 2018-02-05 DIAGNOSIS — R279 Unspecified lack of coordination: Secondary | ICD-10-CM | POA: Diagnosis not present

## 2018-02-05 DIAGNOSIS — J851 Abscess of lung with pneumonia: Secondary | ICD-10-CM | POA: Diagnosis not present

## 2018-02-05 DIAGNOSIS — S72112D Displaced fracture of greater trochanter of left femur, subsequent encounter for closed fracture with routine healing: Secondary | ICD-10-CM | POA: Diagnosis not present

## 2018-02-05 DIAGNOSIS — Z7401 Bed confinement status: Secondary | ICD-10-CM | POA: Diagnosis not present

## 2018-02-05 DIAGNOSIS — I340 Nonrheumatic mitral (valve) insufficiency: Secondary | ICD-10-CM | POA: Diagnosis not present

## 2018-02-05 DIAGNOSIS — I82432 Acute embolism and thrombosis of left popliteal vein: Secondary | ICD-10-CM | POA: Diagnosis not present

## 2018-02-05 DIAGNOSIS — I517 Cardiomegaly: Secondary | ICD-10-CM | POA: Diagnosis not present

## 2018-02-05 DIAGNOSIS — D649 Anemia, unspecified: Secondary | ICD-10-CM | POA: Diagnosis present

## 2018-02-05 DIAGNOSIS — J698 Pneumonitis due to inhalation of other solids and liquids: Secondary | ICD-10-CM | POA: Diagnosis not present

## 2018-02-05 DIAGNOSIS — D6832 Hemorrhagic disorder due to extrinsic circulating anticoagulants: Secondary | ICD-10-CM | POA: Diagnosis present

## 2018-02-05 DIAGNOSIS — I10 Essential (primary) hypertension: Secondary | ICD-10-CM | POA: Diagnosis not present

## 2018-02-05 DIAGNOSIS — M4316 Spondylolisthesis, lumbar region: Secondary | ICD-10-CM | POA: Diagnosis not present

## 2018-02-05 DIAGNOSIS — Z66 Do not resuscitate: Secondary | ICD-10-CM | POA: Diagnosis present

## 2018-02-05 DIAGNOSIS — I2602 Saddle embolus of pulmonary artery with acute cor pulmonale: Secondary | ICD-10-CM | POA: Diagnosis present

## 2018-02-05 DIAGNOSIS — I700 Atherosclerosis of aorta: Secondary | ICD-10-CM | POA: Diagnosis not present

## 2018-02-05 DIAGNOSIS — Z515 Encounter for palliative care: Secondary | ICD-10-CM | POA: Diagnosis not present

## 2018-02-05 DIAGNOSIS — L89323 Pressure ulcer of left buttock, stage 3: Secondary | ICD-10-CM | POA: Diagnosis not present

## 2018-02-05 DIAGNOSIS — I2510 Atherosclerotic heart disease of native coronary artery without angina pectoris: Secondary | ICD-10-CM | POA: Diagnosis not present

## 2018-02-05 DIAGNOSIS — Z452 Encounter for adjustment and management of vascular access device: Secondary | ICD-10-CM | POA: Diagnosis not present

## 2018-02-05 DIAGNOSIS — R1310 Dysphagia, unspecified: Secondary | ICD-10-CM | POA: Diagnosis not present

## 2018-02-05 DIAGNOSIS — I2699 Other pulmonary embolism without acute cor pulmonale: Secondary | ICD-10-CM | POA: Diagnosis not present

## 2018-02-05 DIAGNOSIS — G92 Toxic encephalopathy: Secondary | ICD-10-CM | POA: Diagnosis present

## 2018-02-05 DIAGNOSIS — R000 Tachycardia, unspecified: Secondary | ICD-10-CM | POA: Diagnosis not present

## 2018-02-05 DIAGNOSIS — R918 Other nonspecific abnormal finding of lung field: Secondary | ICD-10-CM | POA: Diagnosis not present

## 2018-02-05 DIAGNOSIS — G4733 Obstructive sleep apnea (adult) (pediatric): Secondary | ICD-10-CM | POA: Diagnosis present

## 2018-02-05 DIAGNOSIS — I82409 Acute embolism and thrombosis of unspecified deep veins of unspecified lower extremity: Secondary | ICD-10-CM | POA: Diagnosis not present

## 2018-02-05 DIAGNOSIS — R401 Stupor: Secondary | ICD-10-CM | POA: Diagnosis not present

## 2018-02-05 DIAGNOSIS — S299XXA Unspecified injury of thorax, initial encounter: Secondary | ICD-10-CM | POA: Diagnosis not present

## 2018-02-05 DIAGNOSIS — D899 Disorder involving the immune mechanism, unspecified: Secondary | ICD-10-CM | POA: Diagnosis not present

## 2018-02-05 DIAGNOSIS — S22089D Unspecified fracture of T11-T12 vertebra, subsequent encounter for fracture with routine healing: Secondary | ICD-10-CM | POA: Diagnosis not present

## 2018-02-05 DIAGNOSIS — G9340 Encephalopathy, unspecified: Secondary | ICD-10-CM | POA: Diagnosis not present

## 2018-02-16 DIAGNOSIS — L89323 Pressure ulcer of left buttock, stage 3: Secondary | ICD-10-CM | POA: Diagnosis not present

## 2018-02-16 DIAGNOSIS — S72112D Displaced fracture of greater trochanter of left femur, subsequent encounter for closed fracture with routine healing: Secondary | ICD-10-CM | POA: Diagnosis not present

## 2018-02-16 DIAGNOSIS — S32502D Unspecified fracture of left pubis, subsequent encounter for fracture with routine healing: Secondary | ICD-10-CM | POA: Diagnosis not present

## 2018-02-16 DIAGNOSIS — G629 Polyneuropathy, unspecified: Secondary | ICD-10-CM | POA: Diagnosis not present

## 2018-03-29 DEATH — deceased

## 2019-05-04 IMAGING — RF DG SWALLOWING FUNCTION
1 series · 1 of 1 positions shown · non-contrast
Comparison: None

CLINICAL DATA: Dysphagia, has had a feeding tube in since July 2017, has been taking some liquids, past history of coronary artery
disease, rheumatoid arthritis, prostate cancer, hypertension, former
smoker

EXAM:
MODIFIED BARIUM SWALLOW
TECHNIQUE: Different consistencies of barium were administered orally to the
patient by the Speech Pathologist. Imaging of the pharynx was
performed in the lateral projection.
FLUOROSCOPY TIME:  Fluoroscopy Time:  3 minutes 18 seconds
Radiation Exposure Index (if provided by the fluoroscopic device):
19.9 mGy
Number of Acquired Spot Images: multiple fluoroscopic screen
captures

[Series 1: cp_standard · 0.26mm/px · 1 of 1 slices shown]
[im 1/1]
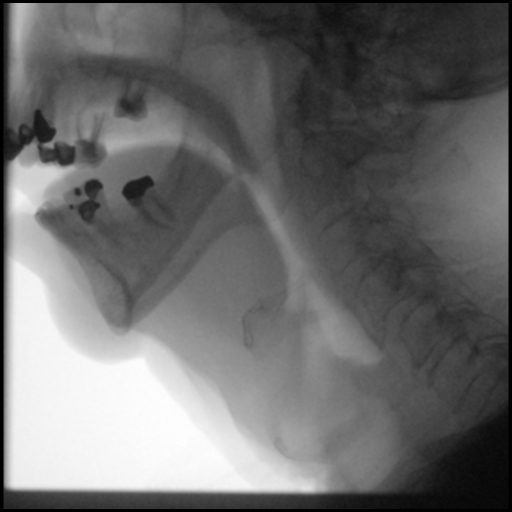

[1 of 1 positions shown; findings below may reference images not displayed]

FINDINGS: Thin liquid- with presentation by teaspoon, normal swelling is seen.
With cup presentation, premature spillover of contrast is identified
with laryngeal penetration and aspiration. A weak spontaneous cough
reflex was seen. Decreased laryngeal penetration occurred with chin
tuck maneuver. Additional swallows by cup later in the exam
demonstrated persistence of premature spillover, delayed initiation,
and flash laryngeal penetration.

Nectar thick liquid- initial swallow of nectar consistency showed no
abnormalities. A second swallow by cup showed premature spillover
and delayed initiation without laryngeal penetration or aspiration.

Honey- not evaluated

Ceejay?[place] no laryngeal penetration or aspiration. Vallecular residuals
were identified. Decreased vallecular residuals after an effortful
swallow. Prior cricopharyngeus muscle noted.

Cracker-vallecular residuals. No laryngeal penetration or
aspiration.

Ceejay?Klpigbb with cracker- no laryngeal penetration or aspiration

Barium tablet - 12.5 mm diameter parapelvic passed from oral cavity
to stomach without obstruction. Premature spillover of view thin
liquid contrast utilized to swallow the pill, with flash laryngeal
penetration aspiration just below the vocal cords.
IMPRESSION: Swallowing dysfunction as above.

Please refer to the Speech Pathologists report for complete details
and recommendations.

## 2019-05-04 IMAGING — MR MR CERVICAL SPINE WO/W CM
4 of 8 series · 15 of 48 positions shown · IV contrast (multihance)
Comparison: Cervical radiographs 01/14/2010

CLINICAL DATA: Impaired gait. Headache and neck pain. Prostate
cancer.

EXAM:
MRI CERVICAL SPINE WITHOUT AND WITH CONTRAST
TECHNIQUE: Multiplanar and multiecho pulse sequences of the cervical spine, to
include the craniocervical junction and cervicothoracic junction,
were obtained without and with intravenous contrast.
CONTRAST:  15 mL MultiHance IV E

[Series 6: T2 · axial · 3.0mm · 0.24mm/px · z∈[-244,-156]mm · 6 of 34 slices shown]
[im 1/34]
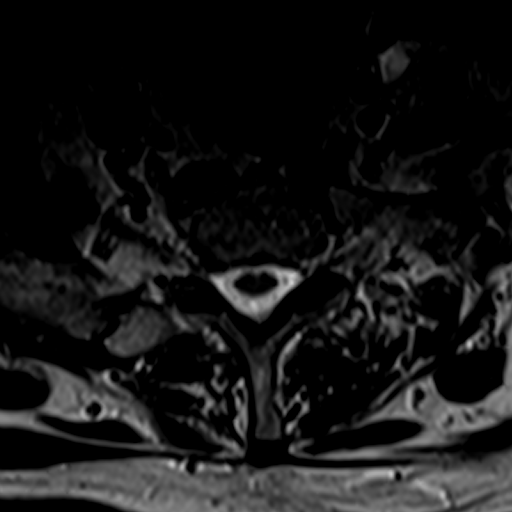
[im 5/34]
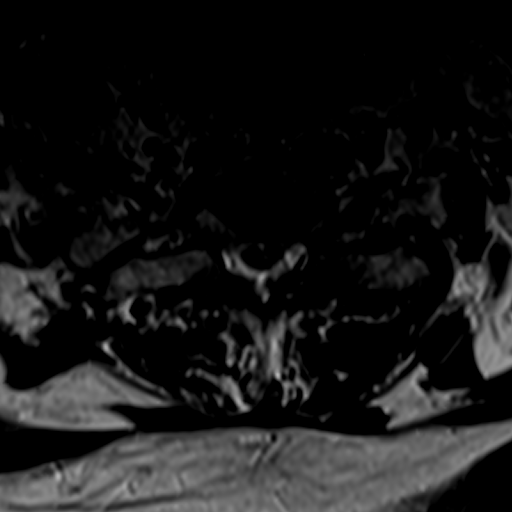
[im 9/34]
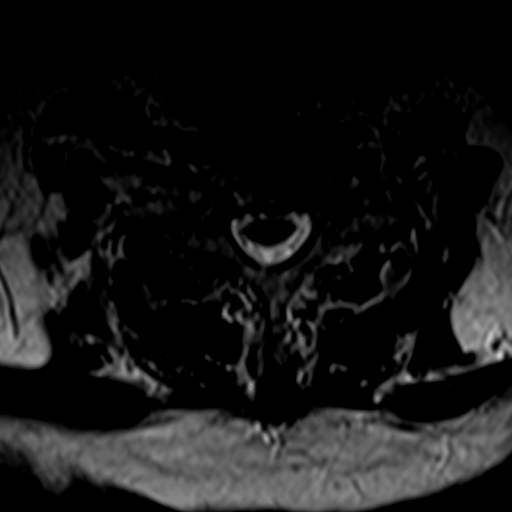
[im 13/34]
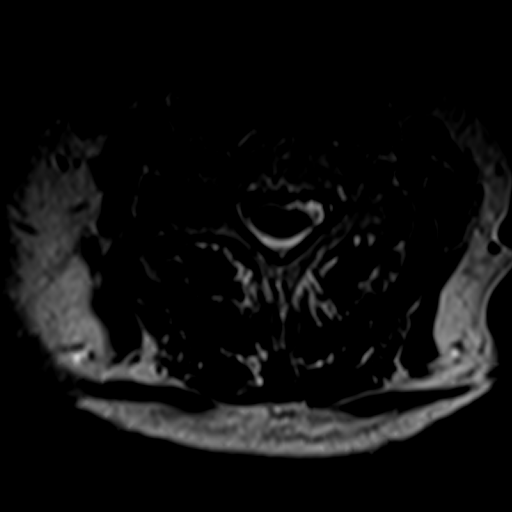
[im 17/34]
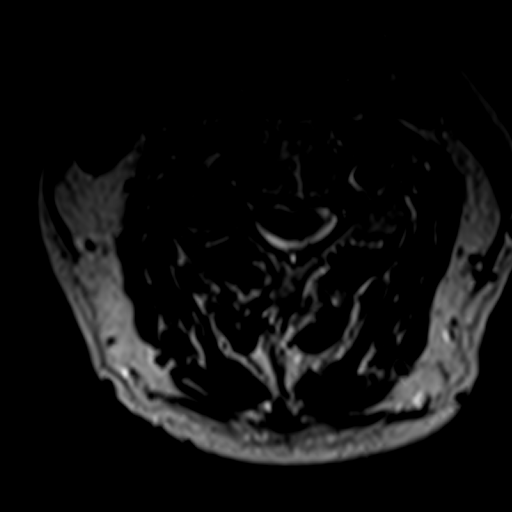
[im 29/34]
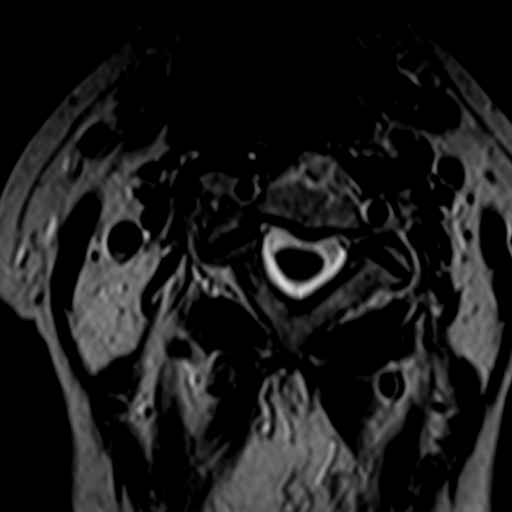

[Series 7: T1 · axial · 3.0mm · 0.24mm/px · z∈[-238,-164]mm · 3 of 34 slices shown]
[im 5/34]
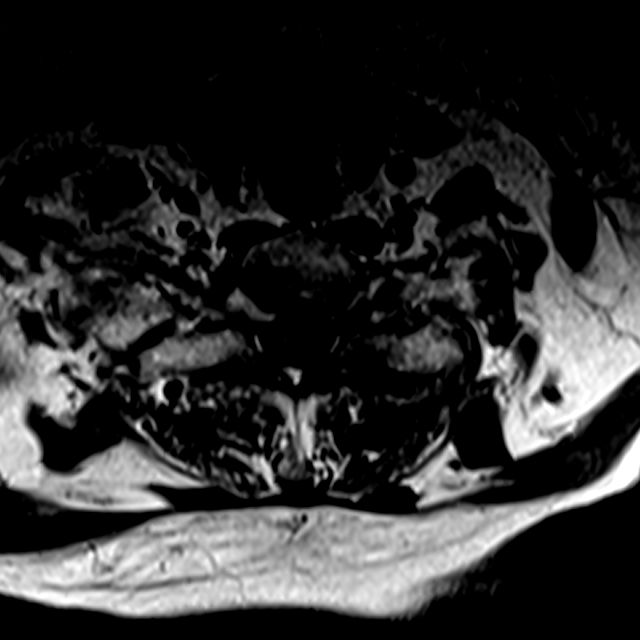
[im 17/34]
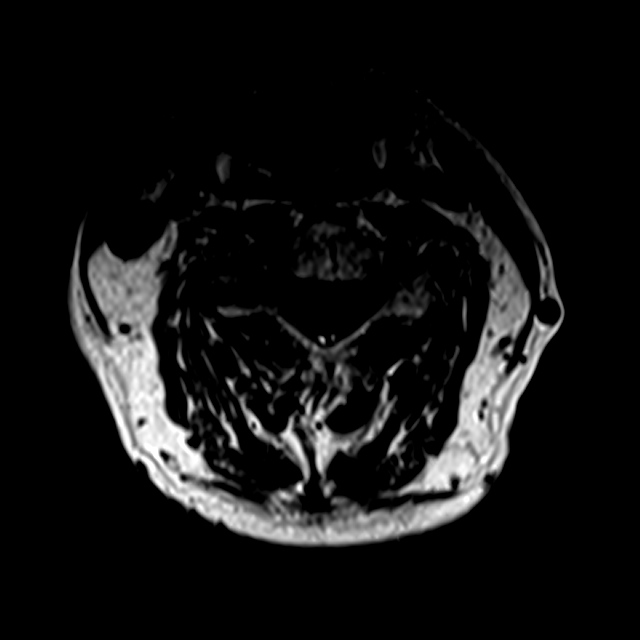
[im 29/34]
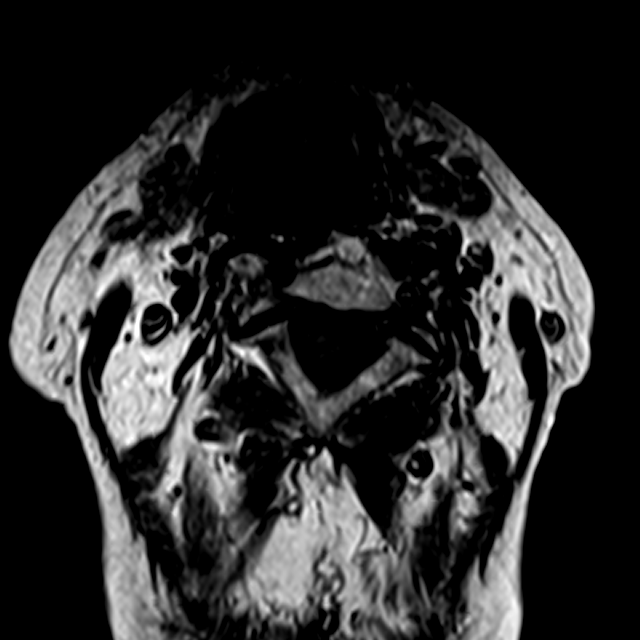

[Series 8: T2 post-contrast · sagittal · 3.0mm · 0.28mm/px · 3 of 13 slices shown]
[im 1/13]
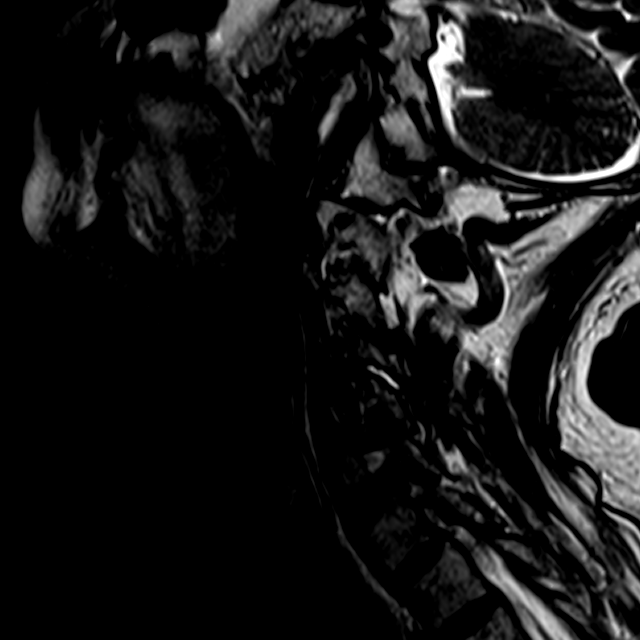
[im 7/13]
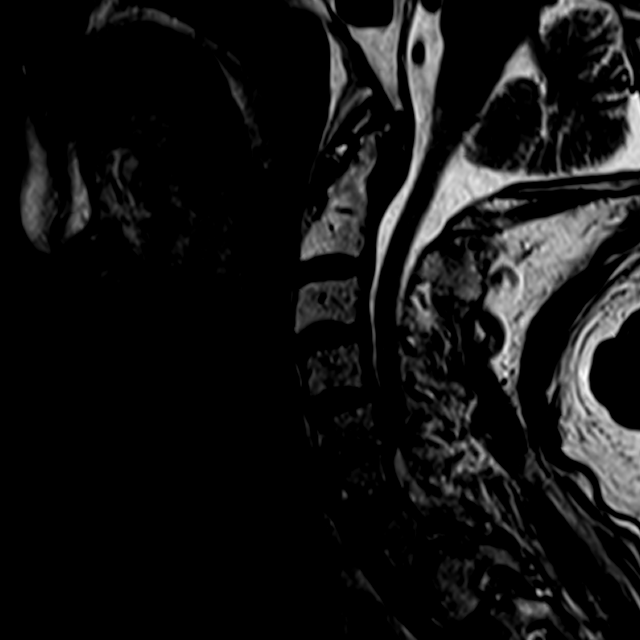
[im 13/13]
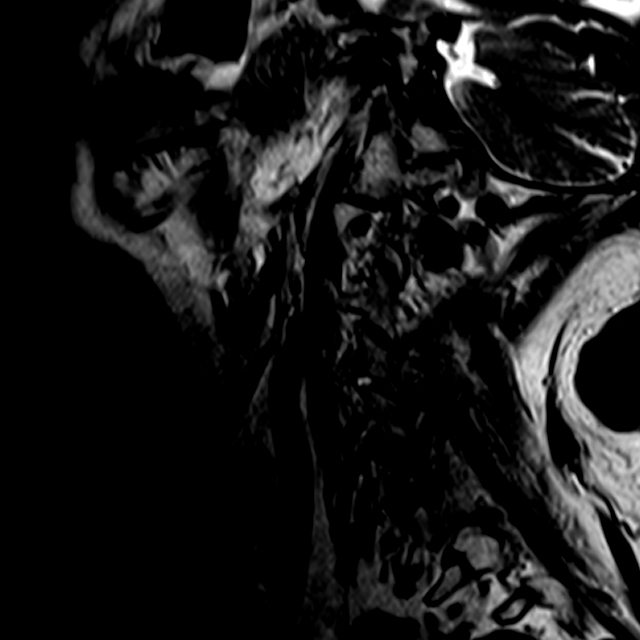

[Series 10: T1 post-contrast · axial · 3.0mm · 0.24mm/px · z∈[-238,-164]mm · 3 of 34 slices shown]
[im 5/34]
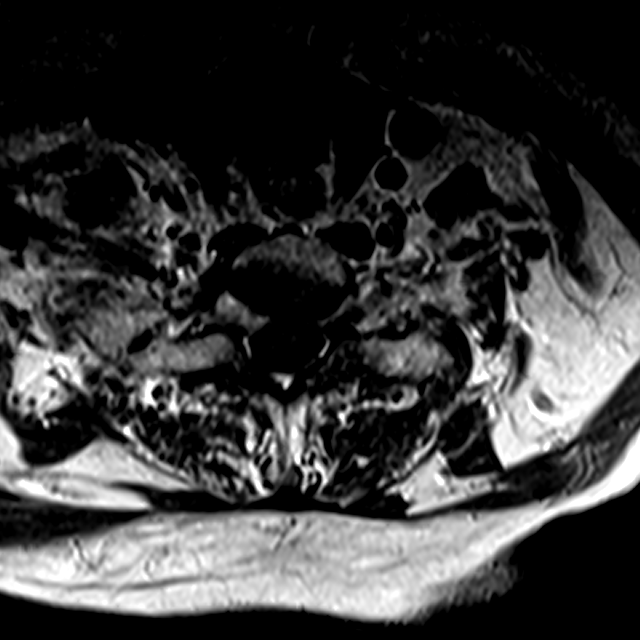
[im 17/34]
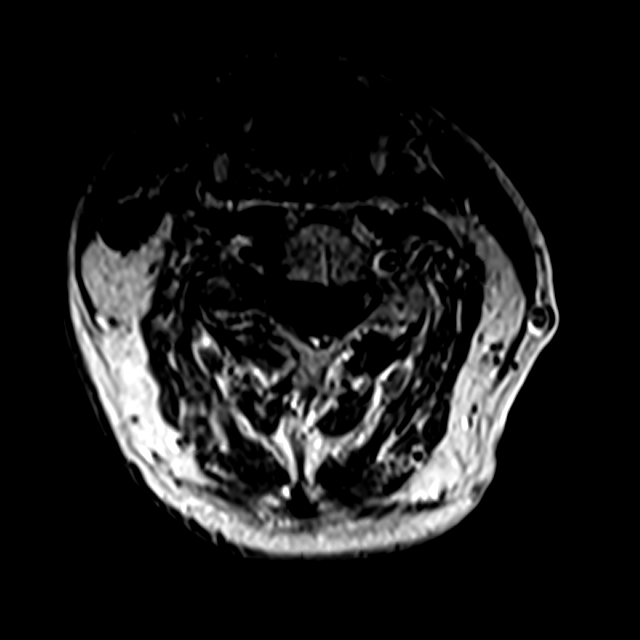
[im 29/34]
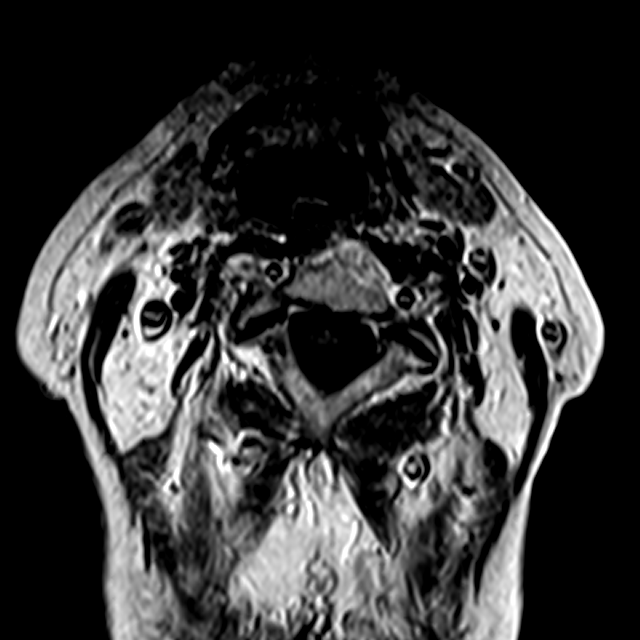

[15 of 48 positions shown; findings below may reference images not displayed]

FINDINGS: Alignment: Mild anterolisthesis C4-5 and mild retrolisthesis C5-6

Vertebrae: Negative for fracture or mass. No evidence of metastatic
disease.

Cord: Normal cord signal.  No cord lesion.

Posterior Fossa, vertebral arteries, paraspinal tissues: Negative

Disc levels:

C1-2: Arthropathy with prominent pannus posterior to the dens. No
cord compression

C2-3:  Small central disc protrusion without cord deformity

C3-4: Disc degeneration with mild uncinate spurring and mild facet
degeneration bilaterally. Mild foraminal narrowing bilaterally

C4-5: Disc degeneration and spondylosis. Mild anterior slip.
Right-sided facet hypertrophy. Mild spinal stenosis and mild to
moderate foraminal stenosis bilaterally

C5-6: Mild retrolisthesis. Disc degeneration with diffuse endplate
spurring and a small central disc protrusion. Moderate spinal
stenosis and moderate left foraminal encroachment

C6-7: Small central disc protrusion. No significant spinal or
foraminal stenosis

C7-T1:  Mild anterior slip.  Negative for stenosis
IMPRESSION: C1-2 arthropathy with prominent pannus formation

Mild foraminal narrowing bilaterally at C3-4

Mild spinal stenosis and mild to moderate foraminal stenosis
bilaterally at C4-5

Moderate spinal stenosis C5-6 with moderate left foraminal
encroachment
